# Patient Record
Sex: Male | Born: 1955 | Race: Black or African American | Hispanic: No | Marital: Single | Smoking: Former smoker
Health system: Southern US, Community
[De-identification: ages and names within clinical notes are randomized; demographics above are authoritative.]

## PROBLEM LIST (undated history)

## (undated) DIAGNOSIS — S82892A Other fracture of left lower leg, initial encounter for closed fracture: Secondary | ICD-10-CM

## (undated) DIAGNOSIS — F1410 Cocaine abuse, uncomplicated: Secondary | ICD-10-CM

## (undated) DIAGNOSIS — I10 Essential (primary) hypertension: Secondary | ICD-10-CM

## (undated) DIAGNOSIS — I2510 Atherosclerotic heart disease of native coronary artery without angina pectoris: Secondary | ICD-10-CM

## (undated) DIAGNOSIS — H5440 Blindness, one eye, unspecified eye: Secondary | ICD-10-CM

## (undated) DIAGNOSIS — I700 Atherosclerosis of aorta: Secondary | ICD-10-CM

## (undated) DIAGNOSIS — F17200 Nicotine dependence, unspecified, uncomplicated: Secondary | ICD-10-CM

## (undated) DIAGNOSIS — R0789 Other chest pain: Secondary | ICD-10-CM

## (undated) DIAGNOSIS — M1990 Unspecified osteoarthritis, unspecified site: Secondary | ICD-10-CM

## (undated) DIAGNOSIS — S8262XA Displaced fracture of lateral malleolus of left fibula, initial encounter for closed fracture: Secondary | ICD-10-CM

## (undated) DIAGNOSIS — I712 Thoracic aortic aneurysm, without rupture: Secondary | ICD-10-CM

## (undated) DIAGNOSIS — L910 Hypertrophic scar: Secondary | ICD-10-CM

## (undated) HISTORY — DX: Cocaine abuse, uncomplicated: F14.10

## (undated) HISTORY — DX: Essential (primary) hypertension: I10

## (undated) HISTORY — DX: Hypertrophic scar: L91.0

## (undated) HISTORY — DX: Atherosclerotic heart disease of native coronary artery without angina pectoris: I25.10

## (undated) HISTORY — DX: Displaced fracture of lateral malleolus of left fibula, initial encounter for closed fracture: S82.62XA

## (undated) HISTORY — DX: Thoracic aortic aneurysm, without rupture: I71.2

## (undated) HISTORY — DX: Atherosclerosis of aorta: I70.0

## (undated) HISTORY — DX: Nicotine dependence, unspecified, uncomplicated: F17.200

---
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
## 1998-02-07 ENCOUNTER — Emergency Department (HOSPITAL_COMMUNITY): Admission: EM | Admit: 1998-02-07 | Discharge: 1998-02-07 | Payer: Self-pay | Admitting: *Deleted

## 1998-06-14 ENCOUNTER — Emergency Department (HOSPITAL_COMMUNITY): Admission: EM | Admit: 1998-06-14 | Discharge: 1998-06-14 | Payer: Self-pay | Admitting: *Deleted

## 2000-07-08 ENCOUNTER — Encounter: Payer: Self-pay | Admitting: Emergency Medicine

## 2000-07-08 ENCOUNTER — Emergency Department (HOSPITAL_COMMUNITY): Admission: EM | Admit: 2000-07-08 | Discharge: 2000-07-08 | Payer: Self-pay | Admitting: Emergency Medicine

## 2000-07-10 ENCOUNTER — Observation Stay (HOSPITAL_COMMUNITY): Admission: RE | Admit: 2000-07-10 | Discharge: 2000-07-11 | Payer: Self-pay | Admitting: *Deleted

## 2001-06-05 ENCOUNTER — Emergency Department (HOSPITAL_COMMUNITY): Admission: EM | Admit: 2001-06-05 | Discharge: 2001-06-05 | Payer: Self-pay | Admitting: Emergency Medicine

## 2001-06-09 ENCOUNTER — Ambulatory Visit (HOSPITAL_COMMUNITY): Admission: RE | Admit: 2001-06-09 | Discharge: 2001-06-09 | Payer: Self-pay | Admitting: *Deleted

## 2001-06-12 ENCOUNTER — Ambulatory Visit (HOSPITAL_COMMUNITY): Admission: RE | Admit: 2001-06-12 | Discharge: 2001-06-12 | Payer: Self-pay | Admitting: Emergency Medicine

## 2001-11-24 ENCOUNTER — Emergency Department (HOSPITAL_COMMUNITY): Admission: EM | Admit: 2001-11-24 | Discharge: 2001-11-24 | Payer: Self-pay

## 2001-11-28 ENCOUNTER — Encounter: Payer: Self-pay | Admitting: Emergency Medicine

## 2001-11-28 ENCOUNTER — Emergency Department (HOSPITAL_COMMUNITY): Admission: EM | Admit: 2001-11-28 | Discharge: 2001-11-28 | Payer: Self-pay | Admitting: Emergency Medicine

## 2001-12-01 ENCOUNTER — Emergency Department (HOSPITAL_COMMUNITY): Admission: EM | Admit: 2001-12-01 | Discharge: 2001-12-01 | Payer: Self-pay | Admitting: Emergency Medicine

## 2002-01-13 ENCOUNTER — Encounter: Payer: Self-pay | Admitting: Internal Medicine

## 2002-01-13 ENCOUNTER — Encounter: Admission: RE | Admit: 2002-01-13 | Discharge: 2002-01-13 | Payer: Self-pay | Admitting: Internal Medicine

## 2003-02-02 ENCOUNTER — Emergency Department (HOSPITAL_COMMUNITY): Admission: EM | Admit: 2003-02-02 | Discharge: 2003-02-02 | Payer: Self-pay | Admitting: Emergency Medicine

## 2003-02-02 ENCOUNTER — Encounter: Payer: Self-pay | Admitting: Emergency Medicine

## 2003-04-30 ENCOUNTER — Emergency Department (HOSPITAL_COMMUNITY): Admission: EM | Admit: 2003-04-30 | Discharge: 2003-04-30 | Payer: Self-pay | Admitting: Emergency Medicine

## 2013-11-19 ENCOUNTER — Emergency Department (HOSPITAL_COMMUNITY)
Admission: EM | Admit: 2013-11-19 | Discharge: 2013-11-19 | Disposition: A | Discharge status: Home / Self Care | Payer: Medicaid Other | Attending: Emergency Medicine | Admitting: Emergency Medicine

## 2013-11-19 ENCOUNTER — Emergency Department (HOSPITAL_COMMUNITY): Payer: Medicaid Other

## 2013-11-19 ENCOUNTER — Encounter (HOSPITAL_COMMUNITY): Payer: Self-pay | Admitting: Emergency Medicine

## 2013-11-19 DIAGNOSIS — W010XXA Fall on same level from slipping, tripping and stumbling without subsequent striking against object, initial encounter: Secondary | ICD-10-CM | POA: Insufficient documentation

## 2013-11-19 DIAGNOSIS — S8263XA Displaced fracture of lateral malleolus of unspecified fibula, initial encounter for closed fracture: Secondary | ICD-10-CM | POA: Insufficient documentation

## 2013-11-19 DIAGNOSIS — F17200 Nicotine dependence, unspecified, uncomplicated: Secondary | ICD-10-CM | POA: Insufficient documentation

## 2013-11-19 DIAGNOSIS — Y939 Activity, unspecified: Secondary | ICD-10-CM | POA: Insufficient documentation

## 2013-11-19 DIAGNOSIS — S82899A Other fracture of unspecified lower leg, initial encounter for closed fracture: Secondary | ICD-10-CM

## 2013-11-19 DIAGNOSIS — Y9289 Other specified places as the place of occurrence of the external cause: Secondary | ICD-10-CM | POA: Insufficient documentation

## 2013-11-19 MED ORDER — OXYCODONE-ACETAMINOPHEN 5-325 MG PO TABS
2.0000 | ORAL_TABLET | Freq: Once | ORAL | Status: AC
  Administered 2013-11-19: 2 via ORAL
  Filled 2013-11-19: qty 2

## 2013-11-19 MED ORDER — OXYCODONE-ACETAMINOPHEN 5-325 MG PO TABS: 2.0000 | ORAL_TABLET | Freq: Four times a day (QID) | ORAL | Status: DC | PRN

## 2013-11-19 NOTE — ED Notes (Signed)
Ortho at bedside.

## 2013-11-19 NOTE — ED Notes (Signed)
Ortho paged for splint 

## 2013-11-19 NOTE — Discharge Instructions (Signed)
°  Ankle Fracture °A fracture is a break in the bone. A cast or splint is used to protect and keep your injured bone from moving.  °HOME CARE INSTRUCTIONS  °· Use your crutches as directed. °· To lessen the swelling, keep the injured leg elevated while sitting or lying down. °· Apply ice to the injury for 15-20 minutes, 03-04 times per day while awake for 2 days. Put the ice in a plastic bag and place a thin towel between the bag of ice and your cast. °· If you have a plaster or fiberglass cast: °· Do not try to scratch the skin under the cast using sharp or pointed objects. °· Check the skin around the cast every day. You may put lotion on any red or sore areas. °· Keep your cast dry and clean. °· If you have a plaster splint: °· Wear the splint as directed. °· You may loosen the elastic around the splint if your toes become numb, tingle, or turn cold or blue. °· Do not put pressure on any part of your cast or splint; it may break. Rest your cast only on a pillow the first 24 hours until it is fully hardened. °· Your cast or splint can be protected during bathing with a plastic bag. Do not lower the cast or splint into water. °· Take medications as directed by your caregiver. Only take over-the-counter or prescription medicines for pain, discomfort, or fever as directed by your caregiver. °· Do not drive a vehicle until your caregiver specifically tells you it is safe to do so. °· If your caregiver has given you a follow-up appointment, it is very important to keep that appointment. Not keeping the appointment could result in a chronic or permanent injury, pain, and disability. If there is any problem keeping the appointment, you must call back to this facility for assistance. °SEEK IMMEDIATE MEDICAL CARE IF:  °· Your cast gets damaged or breaks. °· You have continued severe pain or more swelling than you did before the cast was put on. °· Your skin or toenails below the injury turn blue or gray, or feel cold or  numb. °· There is a bad smell or new stains and/or purulent (pus like) drainage coming from under the cast. °If you do not have a window in your cast for observing the wound, a discharge or minor bleeding may show up as a stain on the outside of your cast. Report these findings to your caregiver. °MAKE SURE YOU:  °· Understand these instructions. °· Will watch your condition. °· Will get help right away if you are not doing well or get worse. °Document Released: 09/12/2000 Document Revised: 12/08/2011 Document Reviewed: 04/14/2013 °[employer]® Patient Information ©2014 [employer]. ° ° °

## 2013-11-19 NOTE — ED Notes (Addendum)
Pt. slipped on ice this evening and injured his left ankle with pain / swelling. No LOC .

## 2013-11-19 NOTE — Progress Notes (Signed)
Orthopedic Tech Progress Note Patient Details:  Lenord CarboSheldon L Coventry July 11, 1956 [id number]  Ortho Devices Type of Ortho Device: Ace wrap;Post (short leg) splint;Stirrup splint;Crutches Ortho Device/Splint Location: LLE Ortho Device/Splint Interventions: Ordered;Application   Jennye MoccasinHughes, Pavielle Biggar Craig 11/19/2013, 11:09 PM

## 2013-11-19 NOTE — ED Notes (Signed)
Pt returned from xray; ice and elevation provided.

## 2013-11-19 NOTE — ED Provider Notes (Signed)
CSN: [id number]     Arrival date & time 11/19/13  2005 History  This chart was scribed for non-physician practitioner, Fuller Canada, working with Hurman Horn, MD by Smiley Houseman, ED Scribe. This patient was seen in room TR11C/TR11C and the patient's care was started at 9:40 PM.  Chief Complaint  Patient presents with  . Ankle Pain   The history is provided by the patient. No language interpreter was used.   HPI Comments: Garrett Fleming is a 58 y.o. male who presents to the Emergency Department complaining of a left ankle injury that occurred this earlier this evening.  Pt states he slipped on ice and once he stood up he couldn't bear weight.  Pt states his pain is currently a 9 out of 10.  Pt noticed swelling and bruising around the medial aspect of his left ankle. Movement makes pain worse, rest makes better.   History reviewed. No pertinent past medical history. History reviewed. No pertinent past surgical history. No family history on file. History  Substance Use Topics  . Smoking status: Current Every Day Smoker  . Smokeless tobacco: Not on file  . Alcohol Use: Yes    Review of Systems  Constitutional: Negative for fever and chills.  Gastrointestinal: Negative for nausea, vomiting and abdominal pain.  Musculoskeletal: Positive for arthralgias (left ankle) and joint swelling (left ankle).  Psychiatric/Behavioral: Negative for behavioral problems and confusion.  All other systems reviewed and are negative.   Allergies  Review of patient's allergies indicates no known allergies.  Home Medications   Current Outpatient Rx  Name  Route  Sig  Dispense  Refill  . ibuprofen (ADVIL,MOTRIN) 600 MG tablet   Oral   Take 1,200 mg by mouth every 6 (six) hours as needed for mild pain.          Triage Vitals: BP 164/105  Pulse 103  Temp(Src) 97.8 F (36.6 C) (Oral)  Resp 16  Ht 6\' 4"  (1.93 m)  Wt 218 lb 1 oz (98.913 kg)  BMI 26.55 kg/m2  SpO2 96%  Physical Exam   Nursing note and vitals reviewed. Constitutional: He is oriented to person, place, and time. He appears well-developed and well-nourished. No distress.  HENT:  Head: Normocephalic and atraumatic.  Eyes: Conjunctivae and EOM are normal. Right eye exhibits no discharge. Left eye exhibits no discharge.  Neck: Neck supple. No tracheal deviation present.  Cardiovascular: Normal rate and intact distal pulses.   Brisk capillary refill.  Pulmonary/Chest: Effort normal. No respiratory distress.  Abdominal: Soft. He exhibits no distension.  Musculoskeletal: Normal range of motion.  Left ankle moderately tender to palpation of medial aspect with moderate swelling.  ROM and strength reduced secondary to pain.  No obvious bony abnormality.    Neurological: He is alert and oriented to person, place, and time.  Sensation intact.   Skin: Skin is warm and dry. No rash noted.  Psychiatric: He has a normal mood and affect. His behavior is normal.    ED Course  Procedures (including critical care time) DIAGNOSTIC STUDIES: Oxygen Saturation is 96% on RA, adequate by my interpretation.    COORDINATION OF CARE: 9:46 PM-Ordered an x-ray of left ankle.  Ordered percocet for pain. Patient informed of current plan of treatment and evaluation and agrees with plan.    10:40 PM-Will order ankle splint with heel stirrup.     Imaging Review Dg Ankle Complete Left  11/19/2013   CLINICAL DATA:  Larey Seat on ice and injured the  left ankle.  EXAM: LEFT ANKLE COMPLETE - 3+ VIEW  COMPARISON:  None.  FINDINGS: Comminuted oblique fracture involving the distal fibula. Avulsion fractures involving the tip of the medial malleolus. Lateral subluxation of the talus relative to the tibia. Diffuse soft tissue swelling. Joint effusion/hemarthrosis.  IMPRESSION: 1. Comminuted oblique fracture of the distal fibula. 2. Avulsion fractures involving the tip of the medial malleolus. 3. Lateral subluxation of the talus relative to the tibia.    Electronically Signed   By: Hulan Saashomas  Lawrence M.D.   On: 11/19/2013 21:56    MDM   Final diagnoses:  Ankle fracture   Patient with ankle fracture. I discussed patient with Dr. Magnus IvanBlackman, who will see the patient next week. Patient placed in splint and given crutches and pain medicine.  I personally performed the services described in this documentation, which was scribed in my presence. The recorded information has been reviewed and is accurate.     Roxy Horsemanobert Shikira Folino, PA-C 11/19/13 2358

## 2013-11-19 NOTE — ED Notes (Signed)
Patient transported to X-ray 

## 2013-11-20 NOTE — ED Provider Notes (Signed)
Medical screening examination/treatment/procedure(s) were performed by non-physician practitioner and as supervising physician I was immediately available for consultation/collaboration.  EKG Interpretation   None        Hurman HornJohn M Timmya Blazier, MD 11/20/13 [phone]

## 2013-11-23 ENCOUNTER — Encounter (HOSPITAL_COMMUNITY): Payer: Self-pay | Admitting: *Deleted

## 2013-11-23 ENCOUNTER — Other Ambulatory Visit (HOSPITAL_COMMUNITY): Payer: Self-pay | Admitting: Orthopaedic Surgery

## 2013-11-23 NOTE — Progress Notes (Signed)
Need orders in EPIC.  Surgery scheduled for 11/25/13.  Thank You.

## 2013-11-25 SURGERY — OPEN REDUCTION INTERNAL FIXATION (ORIF) ANKLE FRACTURE
Anesthesia: Choice | Site: Ankle | Laterality: Left

## 2013-11-29 ENCOUNTER — Encounter (HOSPITAL_COMMUNITY): Payer: Self-pay | Admitting: Pharmacy Technician

## 2013-12-01 ENCOUNTER — Ambulatory Visit (HOSPITAL_COMMUNITY): Admission: RE | Admit: 2013-12-01 | Payer: Medicaid Other | Source: Ambulatory Visit | Admitting: Orthopaedic Surgery

## 2013-12-01 ENCOUNTER — Encounter (HOSPITAL_COMMUNITY): Payer: Self-pay

## 2013-12-01 ENCOUNTER — Encounter (HOSPITAL_COMMUNITY)
Admission: RE | Admit: 2013-12-01 | Discharge: 2013-12-01 | Disposition: A | Discharge status: Home / Self Care | Payer: Medicaid Other | Source: Ambulatory Visit | Attending: Orthopaedic Surgery | Admitting: Orthopaedic Surgery

## 2013-12-01 HISTORY — DX: Blindness, one eye, unspecified eye: H54.40

## 2013-12-01 HISTORY — DX: Other fracture of left lower leg, initial encounter for closed fracture: S82.892A

## 2013-12-01 HISTORY — DX: Other chest pain: R07.89

## 2013-12-01 HISTORY — DX: Unspecified osteoarthritis, unspecified site: M19.90

## 2013-12-01 LAB — CBC
HEMATOCRIT: 37.4 % — AB (ref 39.0–52.0)
HEMOGLOBIN: 12.5 g/dL — AB (ref 13.0–17.0)
MCH: 31.6 pg (ref 26.0–34.0)
MCHC: 33.4 g/dL (ref 30.0–36.0)
MCV: 94.4 fL (ref 78.0–100.0)
Platelets: 329 10*3/uL (ref 150–400)
RBC: 3.96 MIL/uL — ABNORMAL LOW (ref 4.22–5.81)
RDW: 12.2 % (ref 11.5–15.5)
WBC: 5.9 10*3/uL (ref 4.0–10.5)

## 2013-12-01 NOTE — Patient Instructions (Signed)
   [facility]  ON:  Friday  3/6  REPORT TO  SHORT STAY CENTER AT:  12:00 PM      PHONE # FOR SHORT STAY IS [phone]  DO NOT EAT  ANYTHING AFTER MIDNIGHT THE NIGHT BEFORE YOUR SURGERY.  YOU MAY BRUSH YOUR TEETH, NO FOOD, NO CHEWING GUM, NO MINTS, NO CANDIES, NO CHEWING TOBACCO. YOU MAY HAVE CLEAR LIQUIDS TO DRINK FROM MIDNIGHT UNTIL 8:15 AM DAY OF SURGERY - LIKE WATER,  SODA.   NOTHING TO DRINK AFTER 8:15 AM DAY OF SURGERY.  PLEASE TAKE THE FOLLOWING MEDICATIONS THE AM OF YOUR SURGERY WITH A FEW SIPS OF WATER:  PAIN PILL IF NEEDED.   DO NOT BRING VALUABLES, MONEY, CREDIT CARDS.  DO NOT WEAR JEWELRY, MAKE-UP, NAIL POLISH AND NO METAL PINS OR CLIPS IN YOUR HAIR. CONTACT LENS, DENTURES / PARTIALS, GLASSES SHOULD NOT BE WORN TO SURGERY AND IN MOST CASES-HEARING AIDS WILL NEED TO BE REMOVED.  BRING YOUR GLASSES CASE, ANY EQUIPMENT NEEDED FOR YOUR CONTACT LENS. FOR PATIENTS ADMITTED TO THE HOSPITAL--CHECK OUT TIME THE DAY OF DISCHARGE IS 11:00 AM.  ALL INPATIENT ROOMS ARE PRIVATE - WITH BATHROOM, TELEPHONE, TELEVISION AND WIFI INTERNET.    FAILURE TO FOLLOW THESE INSTRUCTIONS MAY RESULT IN THE CANCELLATION OF YOUR SURGERY. PLEASE BE AWARE THAT YOU MAY NEED ADDITIONAL BLOOD DRAWN DAY OF YOUR SURGERY  PATIENT SIGNATURE_________________________________
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
## 2013-12-02 ENCOUNTER — Ambulatory Visit (HOSPITAL_COMMUNITY): Payer: Medicaid Other

## 2013-12-02 ENCOUNTER — Encounter (HOSPITAL_COMMUNITY): Payer: Medicaid Other | Admitting: Anesthesiology

## 2013-12-02 ENCOUNTER — Ambulatory Visit (HOSPITAL_COMMUNITY): Payer: Medicaid Other | Admitting: Anesthesiology

## 2013-12-02 ENCOUNTER — Observation Stay (HOSPITAL_COMMUNITY)
Admission: RE | Admit: 2013-12-02 | Discharge: 2013-12-03 | Disposition: A | Discharge status: Home / Self Care | Payer: Medicaid Other | Source: Ambulatory Visit | Attending: Orthopaedic Surgery | Admitting: Orthopaedic Surgery

## 2013-12-02 ENCOUNTER — Encounter (HOSPITAL_COMMUNITY)
Admission: RE | Disposition: A | Discharge status: Home / Self Care | Payer: Self-pay | Source: Ambulatory Visit | Attending: Orthopaedic Surgery

## 2013-12-02 ENCOUNTER — Encounter (HOSPITAL_COMMUNITY): Payer: Self-pay | Admitting: *Deleted

## 2013-12-02 DIAGNOSIS — Y9329 Activity, other involving ice and snow: Secondary | ICD-10-CM | POA: Insufficient documentation

## 2013-12-02 DIAGNOSIS — S8263XA Displaced fracture of lateral malleolus of unspecified fibula, initial encounter for closed fracture: Principal | ICD-10-CM | POA: Insufficient documentation

## 2013-12-02 DIAGNOSIS — S8262XA Displaced fracture of lateral malleolus of left fibula, initial encounter for closed fracture: Secondary | ICD-10-CM

## 2013-12-02 DIAGNOSIS — F17200 Nicotine dependence, unspecified, uncomplicated: Secondary | ICD-10-CM | POA: Insufficient documentation

## 2013-12-02 DIAGNOSIS — H5440 Blindness, one eye, unspecified eye: Secondary | ICD-10-CM | POA: Insufficient documentation

## 2013-12-02 DIAGNOSIS — W010XXA Fall on same level from slipping, tripping and stumbling without subsequent striking against object, initial encounter: Secondary | ICD-10-CM | POA: Insufficient documentation

## 2013-12-02 HISTORY — PX: ORIF ANKLE FRACTURE: SHX5408

## 2013-12-02 HISTORY — DX: Displaced fracture of lateral malleolus of left fibula, initial encounter for closed fracture: S82.62XA

## 2013-12-02 SURGERY — OPEN REDUCTION INTERNAL FIXATION (ORIF) ANKLE FRACTURE
Anesthesia: General | Site: Ankle | Laterality: Left

## 2013-12-02 MED ORDER — LACTATED RINGERS IV SOLN: INTRAVENOUS | Status: DC

## 2013-12-02 MED ORDER — HYDROMORPHONE HCL PF 1 MG/ML IJ SOLN
0.2500 mg | INTRAMUSCULAR | Status: DC | PRN
  Administered 2013-12-02 (×5): 0.5 mg via INTRAVENOUS

## 2013-12-02 MED ORDER — FENTANYL CITRATE 0.05 MG/ML IJ SOLN
INTRAMUSCULAR | Status: AC
  Filled 2013-12-02: qty 2

## 2013-12-02 MED ORDER — PROMETHAZINE HCL 25 MG/ML IJ SOLN: 6.2500 mg | INTRAMUSCULAR | Status: DC | PRN

## 2013-12-02 MED ORDER — OXYCODONE HCL 5 MG PO TABS
5.0000 mg | ORAL_TABLET | ORAL | Status: DC | PRN
  Administered 2013-12-02 (×2): 5 mg via ORAL
  Administered 2013-12-03 (×6): 10 mg via ORAL
  Filled 2013-12-02 (×4): qty 2
  Filled 2013-12-02 (×2): qty 1
  Filled 2013-12-02 (×2): qty 2

## 2013-12-02 MED ORDER — MIDAZOLAM HCL 2 MG/2ML IJ SOLN
INTRAMUSCULAR | Status: AC
  Filled 2013-12-02: qty 2

## 2013-12-02 MED ORDER — LIDOCAINE HCL (CARDIAC) 20 MG/ML IV SOLN
INTRAVENOUS | Status: DC | PRN
  Administered 2013-12-02: 75 mg via INTRAVENOUS

## 2013-12-02 MED ORDER — POLYETHYLENE GLYCOL 3350 17 G PO PACK: 17.0000 g | PACK | Freq: Every day | ORAL | Status: DC | PRN

## 2013-12-02 MED ORDER — CEFAZOLIN SODIUM 1-5 GM-% IV SOLN
1.0000 g | Freq: Four times a day (QID) | INTRAVENOUS | Status: AC
  Administered 2013-12-02 – 2013-12-03 (×3): 1 g via INTRAVENOUS
  Filled 2013-12-02 (×3): qty 50

## 2013-12-02 MED ORDER — MIDAZOLAM HCL 5 MG/5ML IJ SOLN
INTRAMUSCULAR | Status: DC | PRN
  Administered 2013-12-02 (×2): 1 mg via INTRAVENOUS

## 2013-12-02 MED ORDER — ACETAMINOPHEN 10 MG/ML IV SOLN
1000.0000 mg | Freq: Once | INTRAVENOUS | Status: AC
  Administered 2013-12-02: 1000 mg via INTRAVENOUS
  Filled 2013-12-02: qty 100

## 2013-12-02 MED ORDER — ESMOLOL HCL 10 MG/ML IV SOLN
INTRAVENOUS | Status: DC | PRN
  Administered 2013-12-02: 10 mg via INTRAVENOUS

## 2013-12-02 MED ORDER — CEFAZOLIN SODIUM-DEXTROSE 2-3 GM-% IV SOLR
INTRAVENOUS | Status: AC
  Filled 2013-12-02: qty 50

## 2013-12-02 MED ORDER — METOCLOPRAMIDE HCL 5 MG/ML IJ SOLN: 5.0000 mg | Freq: Three times a day (TID) | INTRAMUSCULAR | Status: DC | PRN

## 2013-12-02 MED ORDER — PROPOFOL 10 MG/ML IV BOLUS
INTRAVENOUS | Status: AC
  Filled 2013-12-02: qty 20

## 2013-12-02 MED ORDER — CEFAZOLIN SODIUM-DEXTROSE 2-3 GM-% IV SOLR
2.0000 g | INTRAVENOUS | Status: AC
  Administered 2013-12-02: 2 g via INTRAVENOUS

## 2013-12-02 MED ORDER — MORPHINE SULFATE 2 MG/ML IJ SOLN: 2.0000 mg | INTRAMUSCULAR | Status: DC | PRN

## 2013-12-02 MED ORDER — SODIUM CHLORIDE 0.9 % IJ SOLN
INTRAMUSCULAR | Status: AC
  Filled 2013-12-02: qty 10

## 2013-12-02 MED ORDER — EPHEDRINE SULFATE 50 MG/ML IJ SOLN
INTRAMUSCULAR | Status: AC
  Filled 2013-12-02: qty 1

## 2013-12-02 MED ORDER — HYDROMORPHONE HCL PF 1 MG/ML IJ SOLN
INTRAMUSCULAR | Status: AC
  Filled 2013-12-02: qty 1

## 2013-12-02 MED ORDER — HYDROMORPHONE HCL PF 1 MG/ML IJ SOLN
0.2500 mg | INTRAMUSCULAR | Status: DC | PRN
  Administered 2013-12-02 (×2): 0.5 mg via INTRAVENOUS

## 2013-12-02 MED ORDER — LIDOCAINE HCL (CARDIAC) 20 MG/ML IV SOLN
INTRAVENOUS | Status: AC
  Filled 2013-12-02: qty 5

## 2013-12-02 MED ORDER — ASPIRIN EC 325 MG PO TBEC: 325.0000 mg | DELAYED_RELEASE_TABLET | Freq: Two times a day (BID) | ORAL | Status: DC

## 2013-12-02 MED ORDER — ONDANSETRON HCL 4 MG/2ML IJ SOLN
INTRAMUSCULAR | Status: DC | PRN
  Administered 2013-12-02: 4 mg via INTRAVENOUS

## 2013-12-02 MED ORDER — ONDANSETRON HCL 4 MG PO TABS: 4.0000 mg | ORAL_TABLET | Freq: Four times a day (QID) | ORAL | Status: DC | PRN

## 2013-12-02 MED ORDER — BUPIVACAINE HCL (PF) 0.5 % IJ SOLN
INTRAMUSCULAR | Status: DC | PRN
  Administered 2013-12-02: 10 mL

## 2013-12-02 MED ORDER — BUPIVACAINE HCL (PF) 0.5 % IJ SOLN
INTRAMUSCULAR | Status: AC
  Filled 2013-12-02: qty 30

## 2013-12-02 MED ORDER — DIPHENHYDRAMINE HCL 12.5 MG/5ML PO ELIX: 12.5000 mg | ORAL_SOLUTION | ORAL | Status: DC | PRN

## 2013-12-02 MED ORDER — DOCUSATE SODIUM 100 MG PO CAPS
100.0000 mg | ORAL_CAPSULE | Freq: Two times a day (BID) | ORAL | Status: DC
  Administered 2013-12-02 – 2013-12-03 (×2): 100 mg via ORAL

## 2013-12-02 MED ORDER — FENTANYL CITRATE 0.05 MG/ML IJ SOLN
INTRAMUSCULAR | Status: AC
  Filled 2013-12-02: qty 5

## 2013-12-02 MED ORDER — ONDANSETRON HCL 4 MG/2ML IJ SOLN: 4.0000 mg | Freq: Four times a day (QID) | INTRAMUSCULAR | Status: DC | PRN

## 2013-12-02 MED ORDER — ONDANSETRON HCL 4 MG/2ML IJ SOLN
INTRAMUSCULAR | Status: AC
  Filled 2013-12-02: qty 2

## 2013-12-02 MED ORDER — MEPERIDINE HCL 50 MG/ML IJ SOLN
6.2500 mg | INTRAMUSCULAR | Status: DC | PRN
  Administered 2013-12-02: 12.5 mg via INTRAVENOUS

## 2013-12-02 MED ORDER — METHOCARBAMOL 100 MG/ML IJ SOLN
500.0000 mg | Freq: Four times a day (QID) | INTRAMUSCULAR | Status: DC | PRN
  Administered 2013-12-02: 500 mg via INTRAVENOUS
  Filled 2013-12-02 (×2): qty 5

## 2013-12-02 MED ORDER — OXYCODONE-ACETAMINOPHEN 5-325 MG PO TABS: 1.0000 | ORAL_TABLET | ORAL | Status: DC | PRN

## 2013-12-02 MED ORDER — SODIUM CHLORIDE 0.9 % IV SOLN
INTRAVENOUS | Status: DC
  Administered 2013-12-02: 1000 mL via INTRAVENOUS
  Administered 2013-12-03: 11:00:00 via INTRAVENOUS

## 2013-12-02 MED ORDER — PROPOFOL 10 MG/ML IV BOLUS
INTRAVENOUS | Status: DC | PRN
  Administered 2013-12-02: 25 mg via INTRAVENOUS
  Administered 2013-12-02: 175 mg via INTRAVENOUS

## 2013-12-02 MED ORDER — FENTANYL CITRATE 0.05 MG/ML IJ SOLN
INTRAMUSCULAR | Status: DC | PRN
  Administered 2013-12-02: 50 ug via INTRAVENOUS
  Administered 2013-12-02: 25 ug via INTRAVENOUS
  Administered 2013-12-02: 50 ug via INTRAVENOUS
  Administered 2013-12-02: 25 ug via INTRAVENOUS
  Administered 2013-12-02 (×2): 50 ug via INTRAVENOUS

## 2013-12-02 MED ORDER — ESMOLOL HCL 10 MG/ML IV SOLN
INTRAVENOUS | Status: AC
  Filled 2013-12-02: qty 10

## 2013-12-02 MED ORDER — METHOCARBAMOL 500 MG PO TABS
500.0000 mg | ORAL_TABLET | Freq: Four times a day (QID) | ORAL | Status: DC | PRN
  Administered 2013-12-03: 500 mg via ORAL
  Filled 2013-12-02: qty 1

## 2013-12-02 MED ORDER — ATROPINE SULFATE 0.4 MG/ML IJ SOLN
INTRAMUSCULAR | Status: AC
  Filled 2013-12-02: qty 1

## 2013-12-02 MED ORDER — METOCLOPRAMIDE HCL 10 MG PO TABS: 5.0000 mg | ORAL_TABLET | Freq: Three times a day (TID) | ORAL | Status: DC | PRN

## 2013-12-02 MED ORDER — HYDROCODONE-ACETAMINOPHEN 5-325 MG PO TABS: 1.0000 | ORAL_TABLET | ORAL | Status: DC | PRN

## 2013-12-02 MED ORDER — LACTATED RINGERS IV SOLN
INTRAVENOUS | Status: DC
  Administered 2013-12-02 (×2): via INTRAVENOUS
  Administered 2013-12-02: 1000 mL via INTRAVENOUS

## 2013-12-02 MED ORDER — MEPERIDINE HCL 50 MG/ML IJ SOLN
INTRAMUSCULAR | Status: AC
  Filled 2013-12-02: qty 1

## 2013-12-02 SURGICAL SUPPLY — 45 items
BAG SPEC THK2 15X12 ZIP CLS (MISCELLANEOUS) ×1
BAG ZIPLOCK 12X15 (MISCELLANEOUS) ×3 IMPLANT
BANDAGE ELASTIC 3 VELCRO ST LF (GAUZE/BANDAGES/DRESSINGS) ×2 IMPLANT
BANDAGE ELASTIC 6 VELCRO ST LF (GAUZE/BANDAGES/DRESSINGS) ×2 IMPLANT
BIT DRILL 3.5 QC 155 (BIT) ×1 IMPLANT
BIT DRILL 3.5 QC 155MM (BIT) ×1
BIT DRILL QC 2.7 6.3IN  SHORT (BIT) ×2
BIT DRILL QC 2.7 6.3IN SHORT (BIT) IMPLANT
COVER SURGICAL LIGHT HANDLE (MISCELLANEOUS) ×3 IMPLANT
CUFF TOURN SGL QUICK 34 (TOURNIQUET CUFF) ×3
CUFF TRNQT CYL 34X4X40X1 (TOURNIQUET CUFF) ×1 IMPLANT
DECANTER SPIKE VIAL GLASS SM (MISCELLANEOUS) ×3 IMPLANT
DRAPE C-ARM 42X120 X-RAY (DRAPES) ×3 IMPLANT
DRAPE U-SHAPE 47X51 STRL (DRAPES) ×3 IMPLANT
DRSG ADAPTIC 3X8 NADH LF (GAUZE/BANDAGES/DRESSINGS) ×3 IMPLANT
ELECT REM PT RETURN 9FT ADLT (ELECTROSURGICAL) ×3
ELECTRODE REM PT RTRN 9FT ADLT (ELECTROSURGICAL) ×1 IMPLANT
GAUZE XEROFORM 5X9 LF (GAUZE/BANDAGES/DRESSINGS) ×2 IMPLANT
GLOVE BIO SURGEON STRL SZ7.5 (GLOVE) ×3 IMPLANT
GLOVE BIOGEL PI IND STRL 8 (GLOVE) ×1 IMPLANT
GLOVE BIOGEL PI INDICATOR 8 (GLOVE) ×2
GLOVE ECLIPSE 8.0 STRL XLNG CF (GLOVE) ×3 IMPLANT
GOWN STRL REUS W/TWL XL LVL3 (GOWN DISPOSABLE) ×3 IMPLANT
NEEDLE HYPO 22GX1.5 SAFETY (NEEDLE) ×3 IMPLANT
PACK LOWER EXTREMITY WL (CUSTOM PROCEDURE TRAY) ×3 IMPLANT
PAD ABD 8X10 STRL (GAUZE/BANDAGES/DRESSINGS) ×3 IMPLANT
PAD CAST 4YDX4 CTTN HI CHSV (CAST SUPPLIES) ×2 IMPLANT
PADDING CAST COTTON 4X4 STRL (CAST SUPPLIES) ×6
PLATE FIBULA DISTAL 5 HOLE (Plate) ×2 IMPLANT
POSITIONER SURGICAL ARM (MISCELLANEOUS) ×3 IMPLANT
SCREW CANCELLOUS 5.0X16MM (Screw) ×2 IMPLANT
SCREW LOCK 3.5X14 (Screw) ×2 IMPLANT
SCREW LOCK 3.5X16 (Screw) ×2 IMPLANT
SCREW NL 3.5X14 (Screw) ×2 IMPLANT
SCREW NLCK 16X3.5XST CORT PRLC (Screw) IMPLANT
SCREW NON LOCK 3.5X12 (Screw) ×2 IMPLANT
SCREW NONLOCK 3.5X16 (Screw) ×3 IMPLANT
SPONGE GAUZE 4X4 12PLY (GAUZE/BANDAGES/DRESSINGS) ×3 IMPLANT
SUT ETHILON 2 0 PS N (SUTURE) ×2 IMPLANT
SUT ETHILON 4 0 PS 2 18 (SUTURE) ×6 IMPLANT
SUT VIC AB 0 CT1 36 (SUTURE) ×4 IMPLANT
SUT VIC AB 2-0 CT1 27 (SUTURE) ×3
SUT VIC AB 2-0 CT1 TAPERPNT 27 (SUTURE) ×1 IMPLANT
SYR CONTROL 10ML LL (SYRINGE) ×3 IMPLANT
TOWEL OR 17X26 10 PK STRL BLUE (TOWEL DISPOSABLE) ×6 IMPLANT

## 2013-12-02 NOTE — Preoperative (Addendum)
Beta Blockers   Reason not to administer Beta Blockers:Not Applicable 

## 2013-12-02 NOTE — Transfer of Care (Signed)
Immediate Anesthesia Transfer of Care Note  Patient: Garrett CarboSheldon L Fleming  Procedure(s) Performed: Procedure(s): OPEN REDUCTION INTERNAL FIXATION (ORIF) LEFT ANKLE FRACTURE (Left)  Patient Location: PACU  Anesthesia Type:General  Level of Consciousness: awake, pateint uncooperative, confused, lethargic and responds to stimulation  Airway & Oxygen Therapy: Patient Spontanous Breathing and Patient connected to face mask oxygen  Post-op Assessment: Report given to PACU RN, Post -op Vital signs reviewed and stable and Patient moving all extremities  Post vital signs: Reviewed and stable  Complications: No apparent anesthesia complications

## 2013-12-02 NOTE — Anesthesia Postprocedure Evaluation (Signed)
Anesthesia Post Note  Patient: Garrett Fleming  Procedure(s) Performed: Procedure(s) (LRB): OPEN REDUCTION INTERNAL FIXATION (ORIF) LEFT ANKLE FRACTURE (Left)  Anesthesia type: General  Patient location: PACU  Post pain: Pain level controlled  Post assessment: Post-op Vital signs reviewed  Last Vitals:  Filed Vitals:   12/02/13 1149  BP: 144/102  Pulse: 60  Temp: 37.1 C  Resp: 18    Post vital signs: Reviewed  Level of consciousness: sedated  Complications: No apparent anesthesia complications
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
## 2013-12-02 NOTE — Anesthesia Preprocedure Evaluation (Addendum)
Anesthesia Evaluation  Patient identified by MRN, date of birth, ID band Patient awake    Reviewed: Allergy & Precautions, H&P , NPO status , Patient's Chart, lab work & pertinent test results  Airway Mallampati: II TM Distance: >3 FB Neck ROM: Full    Dental  (+) Poor Dentition, Chipped, Missing, Dental Advisory Given   Pulmonary neg pulmonary ROS, Current Smoker,  breath sounds clear to auscultation  Pulmonary exam normal       Cardiovascular negative cardio ROS  Rhythm:Regular Rate:Normal     Neuro/Psych negative neurological ROS  negative psych ROS   GI/Hepatic negative GI ROS, Neg liver ROS,   Endo/Other  negative endocrine ROS  Renal/GU negative Renal ROS  negative genitourinary   Musculoskeletal negative musculoskeletal ROS (+)   Abdominal   Peds  Hematology negative hematology ROS (+)   Anesthesia Other Findings   Reproductive/Obstetrics                          Anesthesia Physical Anesthesia Plan  ASA: II  Anesthesia Plan: General   Post-op Pain Management:    Induction: Intravenous  Airway Management Planned: LMA  Additional Equipment:   Intra-op Plan:   Post-operative Plan: Extubation in OR  Informed Consent: I have reviewed the patients History and Physical, chart, labs and discussed the procedure including the risks, benefits and alternatives for the proposed anesthesia with the patient or authorized representative who has indicated his/her understanding and acceptance.   Dental advisory given  Plan Discussed with: CRNA  Anesthesia Plan Comments:         Anesthesia Quick Evaluation

## 2013-12-02 NOTE — Anesthesia Procedure Notes (Signed)
Procedure Name: LMA Insertion Date/Time: 12/02/2013 3:04 PM Performed by: Edison PaceGRAY, Lindell Renfrew E Pre-anesthesia Checklist: Patient identified, Patient being monitored, Timeout performed, Emergency Drugs available and Suction available Patient Re-evaluated:Patient Re-evaluated prior to inductionOxygen Delivery Method: Circle system utilized Preoxygenation: Pre-oxygenation with 100% oxygen Intubation Type: IV induction LMA: LMA inserted LMA Size: 4.0 Number of attempts: 1 Placement Confirmation: positive ETCO2 Tube secured with: Tape Dental Injury: Teeth and Oropharynx as per pre-operative assessment

## 2013-12-02 NOTE — Brief Op Note (Signed)
12/02/2013  4:04 PM  PATIENT:  Garrett Fleming  58 y.o. male  PRE-OPERATIVE DIAGNOSIS:  Left ankle lateral malleolus fracture  POST-OPERATIVE DIAGNOSIS:  Left ankle lateral malleolus fracture  PROCEDURE:  Procedure(s): OPEN REDUCTION INTERNAL FIXATION (ORIF) LEFT ANKLE FRACTURE (Left)  SURGEON:  Surgeon(s) and Role:    * Kathryne Hitchhristopher Y Sahra Converse, MD - Primary  PHYSICIAN ASSISTANT: Rexene EdisonGil Clark, PA-C  ANESTHESIA:   local and general  EBL:  Total I/O In: 1000 [I.V.:1000] Out: -   BLOOD ADMINISTERED:none  DRAINS: none   LOCAL MEDICATIONS USED:  MARCAINE     SPECIMEN:  No Specimen  DISPOSITION OF SPECIMEN:  N/A  COUNTS:  YES  TOURNIQUET:  * Missing tourniquet times found for documented tourniquets in log:  [id number] *  DICTATION: .Other Dictation: Dictation Number [id number]  PLAN OF CARE: Admit for overnight observation  PATIENT DISPOSITION:  PACU - hemodynamically stable.   Delay start of Pharmacological VTE agent (>24hrs) due to surgical blood loss or risk of bleeding: no

## 2013-12-03 NOTE — Op Note (Signed)
Garrett Fleming, Garrett Fleming              ACCOUNT NO.:  [id number]  MEDICAL RECORD NO.:  [id number]  LOCATION:  1609                         [facility]  PHYSICIAN:  Vanita Pandahristopher Y. Magnus IvanBlackman, M.D.DATE OF BIRTH:  04-05-1956  DATE OF PROCEDURE:  12/02/2013 DATE OF DISCHARGE:                              OPERATIVE REPORT   POSTOPERATIVE DIAGNOSIS:  Left unstable ankle lateral malleolus fracture with medial joint space widening.  POSTOPERATIVE DIAGNOSIS:  Left unstable ankle lateral malleolus fracture with medial joint space widening.  PROCEDURE:  Open reduction and internal fixation of left ankle lateral malleolus fracture.  SURGEON:  Doneen Poissonhristopher Patryk Conant MD.  ASSISTING:  Richardean CanalGilbert Clark, PA-C.  ANESTHESIA: 1. General. 2. Local with 0.25% plain Sensorcaine.  TOURNIQUET TIME:  Less than 1 hour.  COMPLICATIONS:  None.  INDICATIONS:  Mr. Garrett Fleming is a 58 year old gentleman, who almost 2 weeks ago slipped and fell on the ice sustaining a unstable left ankle fracture.  He had fracture of the lateral malleolus and significant widening of the medial joint space.  He was placed appropriately in a splint and given followup in our office.  We had him set up for surgery week ago, but then increment whether came and he had a funeral and got stuck.  He follows and was able to get back to town this week and now presents for definitive fixation of the ankle fracture.  He understands the reason behind proceeding with surgery given the unstable nature of this injury.  He does wish to proceed.  PROCEDURE DESCRIPTION:  After informed consent was obtained. Appropriate left ankle was marked.  He was brought to the operating room, placed supine on the operating table.  General anesthesia was obtained.  A nonsterile tourniquet was placed on his upper left leg and his left leg was prepped and draped from the knee down the toes with DuraPrep and sterile drapes.  Time-out was called to identify  correct patient, correct left ankle.  We then used an Esmarch wrap of the ankle and tourniquet was inflated to 300 mmHg.  We then made incision directly midline over the fibula and carried this proximally and distally.  We were able to dissect down the fibula itself and found a comminuted fibular fracture.  It took quite some effort to get it reduced.  We cannot place the lag screw of anterior to posterior due to the comminution.  We were able to fashion a periarticular fibular plate from [employer] around the lateral cortex of the fibula and secured this with bicortical screws proximally and 5-0 cancellous screw followed by 2 locking screws distally to hold the fracture reduced.  Once this was done under direct fluoroscopic guidance, we stressed the ankle joint in the mortise of the ankle joint stay congruent.  We then irrigated the soft tissue with normal saline solution and closed the deep tissue over the plate with 0 Vicryl followed by 2-0 Vicryl in subcutaneous tissue, and interrupted 2-0 nylon on the skin.  We infiltrated the incision with 0.25% plain Sensorcaine, and I placed Xeroform and a well-padded dressing.  The tourniquet was let down.  The toes pinked nicely.  He was awakened, extubated, and taken to recovery  room in stable condition.  All final counts were correct.  There were no complications noted.     Vanita Panda. Magnus Ivan, M.D.     CYB/MEDQ  D:  12/02/2013  T:  12/03/2013  Job:  [id number]

## 2013-12-03 NOTE — Evaluation (Signed)
Physical Therapy Evaluation Patient Details Name: Garrett Fleming MRN: [id number] DOB: 1956/04/06 Today's Date: 12/03/2013 Time: 4098-1191 PT Time Calculation (min): 34 min  PT Assessment / Plan / Recommendation History of Present Illness  Ankle fx s/p ~ 2 weeks; Final fixation 12/02/13  Clinical Impression  Pt s/p L ankle fx presents with functional mobility limitations 2* NWB status and post op pain.  Pt mobilizing with Sup level assist on RW and plans d/c home with assist of "lady friend".    PT Assessment  Patent does not need any further PT services    Follow Up Recommendations  No PT follow up    Does the patient have the potential to tolerate intense rehabilitation      Barriers to Discharge        Equipment Recommendations  Rolling walker with 5" wheels;3in1 (PT)    Recommendations for Other Services     Frequency      Precautions / Restrictions Precautions Precautions: Fall Required Braces or Orthoses: Other Brace/Splint Other Brace/Splint: Cam Walker on L  Restrictions Weight Bearing Restrictions: Yes LLE Weight Bearing: Non weight bearing   Pertinent Vitals/Pain 7/10 with L LE dependent; Pain meds received during session      Mobility  Bed Mobility Overal bed mobility: Modified Independent Transfers Overall transfer level: Needs assistance Equipment used: Rolling walker (2 wheeled) Transfers: Sit to/from Stand Sit to Stand: Min guard;Supervision General transfer comment: cues for saftey, transition position, LE management and use of UEs to self assist Ambulation/Gait Ambulation/Gait assistance: Min guard;Supervision Ambulation Distance (Feet): 50 Feet (and 30) Assistive device: Rolling walker (2 wheeled) Gait Pattern/deviations: Step-to pattern;Trunk flexed Gait velocity: decr General Gait Details: Cues for posture, sequence, LE management and position from RW Stairs: Yes Stairs assistance: Min assist Stair Management: One rail Right;Step to  pattern;Forwards;With crutches Number of Stairs: 4 General stair comments: cues for sequence, foot/crutch placement and L LE management    Exercises     PT Diagnosis:    PT Problem List:   PT Treatment Interventions:       PT Goals(Current goals can be found in the care plan section) Acute Rehab PT Goals Patient Stated Goal: HOME PT Goal Formulation: No goals set, d/c therapy  Visit Information  Last PT Received On: 12/03/13 Assistance Needed: +1 History of Present Illness: Ankle fx s/p ~ 2 weeks; Final fixation 12/02/13       Prior Functioning  Home Living Family/patient expects to be discharged to:: Private residence Living Arrangements: Spouse/significant other Available Help at Discharge: Family Type of Home: Apartment Home Access: Stairs to enter Secretary/administrator of Steps: 3 Entrance Stairs-Rails: Right;Left Home Layout: One level Home Equipment: Crutches Additional Comments: Pt states he has been struggling maintaining NWB on crutches at home.  Spouse is present and states pt is not stable withi crutches Prior Function Level of Independence: Needs assistance Comments: Pt on crutches and NWB x 2 weeks - IND prior to this Communication Communication: No difficulties Dominant Hand: Right    Cognition  Cognition Arousal/Alertness: Awake/alert Behavior During Therapy: WFL for tasks assessed/performed Overall Cognitive Status: Within Functional Limits for tasks assessed    Extremity/Trunk Assessment Upper Extremity Assessment Upper Extremity Assessment: Overall WFL for tasks assessed Lower Extremity Assessment Lower Extremity Assessment: LLE deficits/detail LLE Deficits / Details: Ankle immobilized Cervical / Trunk Assessment Cervical / Trunk Assessment: Normal   Balance    End of Session PT - End of Session Equipment Utilized During Treatment: Gait belt Activity Tolerance: Patient  tolerated treatment well;Patient limited by pain Patient left: in  bed;with call bell/phone within reach;with family/visitor present Nurse Communication: Mobility status  GP     Garrett Fleming 12/03/2013, 4:12 PM

## 2013-12-03 NOTE — Discharge Summary (Signed)
  Discharge to home in stable condition status post open reduction internal fixation left ankle lateral malleolus. Prescription for Percocet for pain aspirin for DVT prophylaxis. Fracture boot protected weightbearing on the left continue elevation.

## 2013-12-03 NOTE — Progress Notes (Signed)
   CARE MANAGEMENT NOTE 12/03/2013  Patient:  Lenord CarboLSTON,Jacen L   Account Number:  [id number]  Date Initiated:  12/03/2013  Documentation initiated by:  [facility] Center[facility]AV[facility],Shabnam Ladd  Subjective/Objective Assessment:   Open reduction and internal fixation of left ankle lateral  malleolus fracture.     Action/Plan:   lives at home with wife   Anticipated DC Date:  12/03/2013   Anticipated DC Plan:  HOME/SELF CARE      DC Planning Services  CM consult      Choice offered to / List presented to:     DME arranged  [facility]  3-N-1      DME agency  [facility]        Status of service:  Completed, signed off Medicare Important Message given?   (If response is "NO", the following Medicare IM given date fields will be blank) Date Medicare IM given:   Date Additional Medicare IM given:    Discharge Disposition:  HOME/SELF CARE  Per UR Regulation:    If discussed at Long Length of Stay Meetings, dates discussed:    Comments:  12/03/2013 1600 NCM spoke to pt and no HH PT recommended. Pt requesting RW and 3n1 for home. Contacted [facility] for DME for home. Isidoro DonningAlesia Lylith Bebeau RN CCM Case Mgmt phone [phone]

## 2013-12-05 ENCOUNTER — Encounter (HOSPITAL_COMMUNITY): Payer: Self-pay | Admitting: Orthopaedic Surgery

## 2016-01-14 ENCOUNTER — Encounter: Payer: Self-pay | Admitting: Specialist

## 2016-06-17 ENCOUNTER — Encounter: Payer: Self-pay | Admitting: Specialist

## 2017-01-14 ENCOUNTER — Emergency Department (HOSPITAL_COMMUNITY)
Admission: EM | Admit: 2017-01-14 | Discharge: 2017-01-14 | Disposition: A | Discharge status: Home / Self Care | Payer: Medicaid Other | Attending: Emergency Medicine | Admitting: Emergency Medicine

## 2017-01-14 DIAGNOSIS — K0889 Other specified disorders of teeth and supporting structures: Secondary | ICD-10-CM

## 2017-01-14 DIAGNOSIS — K047 Periapical abscess without sinus: Secondary | ICD-10-CM

## 2017-01-14 DIAGNOSIS — F17210 Nicotine dependence, cigarettes, uncomplicated: Secondary | ICD-10-CM | POA: Diagnosis not present

## 2017-01-14 DIAGNOSIS — Z7982 Long term (current) use of aspirin: Secondary | ICD-10-CM | POA: Diagnosis not present

## 2017-01-14 DIAGNOSIS — Z79899 Other long term (current) drug therapy: Secondary | ICD-10-CM | POA: Diagnosis not present

## 2017-01-14 MED ORDER — PENICILLIN V POTASSIUM 500 MG PO TABS: 500.0000 mg | ORAL_TABLET | Freq: Four times a day (QID) | ORAL | 0 refills | Status: AC

## 2017-01-14 MED ORDER — TRAMADOL HCL 50 MG PO TABS
50.0000 mg | ORAL_TABLET | Freq: Once | ORAL | Status: AC
  Administered 2017-01-14: 50 mg via ORAL
  Filled 2017-01-14: qty 1

## 2017-01-14 NOTE — ED Triage Notes (Signed)
Pt states he has abscess on right side lower teeth. He has swelling noted to the jaw. Pt reports using salt water rinse at home without improvement.
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
## 2017-01-14 NOTE — Discharge Instructions (Signed)
Take all of your antibiotics as prescribed, do not save or share them. He may continue taking Tylenol and Motrin for discomfort. Follow-up with your dentist or use the resources to help find a dentist. Return to ED for new or worsening symptoms as we discussed  [facility]  [employer] [facility]  [address]  [place], [place] [address]  Phone [phone]  [facility] [employer] [facility] [place] exemplifies the [employer]?s vision to improve the health and quality of life of all [place]ians by Public house manager with a passion to care for the underserved and by leading the nation in community-based, service learning oral health education. We are committed to offering comprehensive general dental services for adults, children and special needs patients in a safe, caring and professional setting.  Appointments: Our clinic is open Monday through Friday 8:00 a.m. until 5:00 p.m. The amount of time scheduled for an appointment depends on the patient?s specific needs. We ask that you keep your appointed time for care or provide 24-hour notice of all appointment changes. Parents or legal guardians must accompany minor children.  Payment for Services: Medicaid and other insurance plans are welcome. Payment for services is due when services are rendered and may be made by cash or credit card. If you have dental insurance, we will assist you with your claim submission.   Emergencies: Emergency services will be provided Monday through Friday on a walk-in basis. Please arrive early for emergency services. After hours emergency services will be provided for patients of record as required.  Services:  Comprehensive General Dentistry  Children?s Dentistry  Oral Surgery - Extractions  Root Canals  Sealants and Tooth Colored Fillings  Crowns and Midwife  3-D/[facility]

## 2017-07-21 LAB — GLUCOSE, POCT (MANUAL RESULT ENTRY): POC Glucose: 125 mg/dl — AB (ref 70–99)

## 2017-09-27 ENCOUNTER — Emergency Department (HOSPITAL_COMMUNITY)
Admission: EM | Admit: 2017-09-27 | Discharge: 2017-09-27 | Disposition: A | Discharge status: Home / Self Care | Payer: Medicaid Other | Attending: Physician Assistant | Admitting: Physician Assistant

## 2017-09-27 ENCOUNTER — Encounter (HOSPITAL_COMMUNITY): Payer: Self-pay

## 2017-09-27 ENCOUNTER — Other Ambulatory Visit: Payer: Self-pay

## 2017-09-27 ENCOUNTER — Emergency Department (HOSPITAL_COMMUNITY): Payer: Medicaid Other

## 2017-09-27 DIAGNOSIS — Z7982 Long term (current) use of aspirin: Secondary | ICD-10-CM | POA: Insufficient documentation

## 2017-09-27 DIAGNOSIS — F17210 Nicotine dependence, cigarettes, uncomplicated: Secondary | ICD-10-CM | POA: Diagnosis not present

## 2017-09-27 DIAGNOSIS — M25561 Pain in right knee: Secondary | ICD-10-CM | POA: Insufficient documentation

## 2017-09-27 MED ORDER — NAPROXEN 250 MG PO TABS
500.0000 mg | ORAL_TABLET | Freq: Once | ORAL | Status: AC
  Administered 2017-09-27: 500 mg via ORAL
  Filled 2017-09-27: qty 2

## 2017-09-27 MED ORDER — NAPROXEN 500 MG PO TABS: 500.0000 mg | ORAL_TABLET | Freq: Two times a day (BID) | ORAL | 0 refills | Status: DC

## 2017-09-27 NOTE — Discharge Instructions (Signed)
You were seen in the emergency department for pain and swelling in your right knee.  Your x-ray did not show any fracture or dislocation.  I have prescribed you naproxen this is a nonsteroidal anti-inflammatory medication.  You may take this once as needed every 12 hours for pain and swelling, be sure to take this with food as it can cause stomach upset and at worst stomach bleeding.  Do not take other nonsteroidal anti-inflammatories while taking this medicine including Goody powder, Advil, Aleve, or Motrin.  You may supplement with Tylenol.  Apply ice to the knee 20 minutes on 40 minutes off for the next 24-48 hours to be sure to use a towel to wrap the ice in.   Purchase a knee sleeve at the drug store to compress the swelling. Be sure to remove this if it is too tight- such as if you start to have tingling or numbness to your feet or they appear pale or blue.   Follow-up with the orthopedic doctor that I have provided in your discharge instructions within 1 week if you have not had improvement in your symptoms.  Return to the emergency department for any new or worsening symptoms including but not limited to fever, chills, numbness, weakness, inability to walk, redness of the knee, increase the pain or swelling.    Your blood pressure was elevated in the emergency department today. You will need to follow up with your primary care doctor to have this rechecked.   Vitals:   09/27/17 1152  BP: (!) 153/91  Pulse: 72  Resp: 18  Temp: 98.2 F (36.8 C)  SpO2: 98%

## 2017-09-27 NOTE — ED Triage Notes (Signed)
Pt states he hit right knee on bird bath last week while doing yard work. Pt states he has been having swelling and pain to knee since injury. Pain is worse with ambulation.
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
## 2017-10-14 ENCOUNTER — Emergency Department (HOSPITAL_COMMUNITY)
Admission: EM | Admit: 2017-10-14 | Discharge: 2017-10-14 | Disposition: A | Discharge status: Home / Self Care | Payer: Medicaid Other | Attending: Emergency Medicine | Admitting: Emergency Medicine

## 2017-10-14 ENCOUNTER — Encounter (HOSPITAL_COMMUNITY): Payer: Self-pay | Admitting: Nurse Practitioner

## 2017-10-14 ENCOUNTER — Emergency Department (HOSPITAL_COMMUNITY): Payer: Medicaid Other

## 2017-10-14 DIAGNOSIS — Z79899 Other long term (current) drug therapy: Secondary | ICD-10-CM | POA: Insufficient documentation

## 2017-10-14 DIAGNOSIS — F17210 Nicotine dependence, cigarettes, uncomplicated: Secondary | ICD-10-CM | POA: Insufficient documentation

## 2017-10-14 DIAGNOSIS — W2209XD Striking against other stationary object, subsequent encounter: Secondary | ICD-10-CM | POA: Diagnosis not present

## 2017-10-14 DIAGNOSIS — M25561 Pain in right knee: Secondary | ICD-10-CM | POA: Diagnosis not present

## 2017-10-14 DIAGNOSIS — Z7982 Long term (current) use of aspirin: Secondary | ICD-10-CM | POA: Insufficient documentation

## 2017-10-14 MED ORDER — NAPROXEN 500 MG PO TABS: 500.0000 mg | ORAL_TABLET | Freq: Two times a day (BID) | ORAL | 0 refills | Status: DC

## 2017-10-14 MED ORDER — TRAMADOL HCL 50 MG PO TABS: 50.0000 mg | ORAL_TABLET | Freq: Four times a day (QID) | ORAL | 0 refills | Status: DC | PRN

## 2017-10-14 MED ORDER — TRAMADOL HCL 50 MG PO TABS
50.0000 mg | ORAL_TABLET | Freq: Once | ORAL | Status: AC
  Administered 2017-10-14: 50 mg via ORAL
  Filled 2017-10-14: qty 1
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
## 2017-10-14 NOTE — Discharge Instructions (Addendum)
Please read and follow all provided instructions.  You have been seen today for pain in your right knee  Tests performed today include: An x-ray of the affected area - does NOT show any broken bones or dislocations.  Vital signs. See below for your results today.  You were given two prescriptions today:  Naproxen-naproxen is a nonsteroidal anti-inflammatory medication.  This will help with pain and swelling.  Take this once every 12 hours with food, as it can cause stomach upset and at worse stomach bleeding.  Do not take other NSAIDs with this (Motrin, Advil, Aleve). Tramadol- this is a stronger pain medication which is safe to take with the Naproxen. Take this for severe pain. Do not drive or operate heavy machinery while taking this medicine Follow-up instructions: Please follow-up with your primary care provider tomorrow for a recheck of your knee and for further evaluation. Return to the emergency department for new or worsening symptoms including, but not limited to worsened pain, fever, chills, nausea, vomiting, worsening swelling, or any other concerns.    Additional Information:  Your vital signs today were: BP (!) 138/97 (BP Location: Left Arm)    Pulse 61    Temp 98.7 F (37.1 C) (Oral)    Resp 16    Ht 6\' 4"  (1.93 m)    Wt 95.3 kg (210 lb)    SpO2 96%    BMI 25.56 kg/m  If your blood pressure (BP) was elevated above 135/85 this visit, please have this repeated by your doctor within one month. ---------------

## 2017-10-14 NOTE — ED Triage Notes (Addendum)
Pt hi right knee last month and states pain and swelling have increased and he has difficulty ambulating or moving knee. Patient has done ice and anti-inflammatory meds without relief of symptoms. Right knee is warm to touch, no redness or streaking noted, knee is edematous.

## 2018-07-27 LAB — GLUCOSE, POCT (MANUAL RESULT ENTRY): POC Glucose: 122 mg/dl — AB (ref 70–99)

## 2018-10-31 ENCOUNTER — Other Ambulatory Visit: Payer: Self-pay

## 2018-10-31 ENCOUNTER — Encounter (HOSPITAL_COMMUNITY): Payer: Self-pay

## 2018-10-31 ENCOUNTER — Emergency Department (HOSPITAL_COMMUNITY)
Admission: EM | Admit: 2018-10-31 | Discharge: 2018-10-31 | Disposition: A | Discharge status: Home / Self Care | Payer: Medicaid Other | Attending: Emergency Medicine | Admitting: Emergency Medicine

## 2018-10-31 DIAGNOSIS — Q828 Other specified congenital malformations of skin: Secondary | ICD-10-CM | POA: Diagnosis not present

## 2018-10-31 DIAGNOSIS — L918 Other hypertrophic disorders of the skin: Secondary | ICD-10-CM

## 2018-10-31 DIAGNOSIS — L910 Hypertrophic scar: Secondary | ICD-10-CM | POA: Diagnosis not present

## 2018-10-31 DIAGNOSIS — T8189XA Other complications of procedures, not elsewhere classified, initial encounter: Secondary | ICD-10-CM

## 2018-10-31 DIAGNOSIS — F17210 Nicotine dependence, cigarettes, uncomplicated: Secondary | ICD-10-CM | POA: Insufficient documentation

## 2018-10-31 DIAGNOSIS — Z7982 Long term (current) use of aspirin: Secondary | ICD-10-CM | POA: Diagnosis not present

## 2018-10-31 DIAGNOSIS — Z189 Retained foreign body fragments, unspecified material: Secondary | ICD-10-CM

## 2018-10-31 DIAGNOSIS — R221 Localized swelling, mass and lump, neck: Secondary | ICD-10-CM | POA: Diagnosis present
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
## 2018-10-31 NOTE — ED Notes (Signed)
Nurse at bedside discharging pt.  

## 2018-10-31 NOTE — ED Notes (Signed)
Patient verbalizes understanding of discharge instructions. Opportunity for questioning and answers were provided. Armband removed by staff, pt discharged from ED ambulatory.   

## 2018-10-31 NOTE — ED Triage Notes (Signed)
Pt arrives POV for a "bump" to the back of his left head. Pt endorses pian and itching. Pt states it's been there for months and blood sometimes drains from it.

## 2018-10-31 NOTE — Discharge Instructions (Addendum)
Return for any new or worsening symptoms.

## 2018-11-16 ENCOUNTER — Emergency Department (HOSPITAL_COMMUNITY): Payer: Medicaid Other

## 2018-11-16 ENCOUNTER — Encounter (HOSPITAL_COMMUNITY): Payer: Self-pay

## 2018-11-16 ENCOUNTER — Encounter: Payer: Self-pay | Admitting: Plastic Surgery

## 2018-11-16 ENCOUNTER — Other Ambulatory Visit: Payer: Self-pay

## 2018-11-16 ENCOUNTER — Ambulatory Visit (INDEPENDENT_AMBULATORY_CARE_PROVIDER_SITE_OTHER): Payer: Medicaid Other | Admitting: Plastic Surgery

## 2018-11-16 ENCOUNTER — Inpatient Hospital Stay (HOSPITAL_COMMUNITY)
Admission: EM | Admit: 2018-11-16 | Discharge: 2018-11-30 | DRG: 220 | Disposition: A | Discharge status: Home / Self Care | Payer: Medicaid Other | Attending: Surgery | Admitting: Surgery

## 2018-11-16 DIAGNOSIS — F1410 Cocaine abuse, uncomplicated: Secondary | ICD-10-CM | POA: Diagnosis present

## 2018-11-16 DIAGNOSIS — Z7982 Long term (current) use of aspirin: Secondary | ICD-10-CM

## 2018-11-16 DIAGNOSIS — I739 Peripheral vascular disease, unspecified: Secondary | ICD-10-CM | POA: Diagnosis present

## 2018-11-16 DIAGNOSIS — Z79899 Other long term (current) drug therapy: Secondary | ICD-10-CM | POA: Diagnosis not present

## 2018-11-16 DIAGNOSIS — I719 Aortic aneurysm of unspecified site, without rupture: Secondary | ICD-10-CM | POA: Diagnosis present

## 2018-11-16 DIAGNOSIS — I700 Atherosclerosis of aorta: Secondary | ICD-10-CM | POA: Diagnosis present

## 2018-11-16 DIAGNOSIS — Z452 Encounter for adjustment and management of vascular access device: Secondary | ICD-10-CM

## 2018-11-16 DIAGNOSIS — I129 Hypertensive chronic kidney disease with stage 1 through stage 4 chronic kidney disease, or unspecified chronic kidney disease: Secondary | ICD-10-CM | POA: Diagnosis present

## 2018-11-16 DIAGNOSIS — Z7151 Drug abuse counseling and surveillance of drug abuser: Secondary | ICD-10-CM | POA: Diagnosis not present

## 2018-11-16 DIAGNOSIS — N179 Acute kidney failure, unspecified: Secondary | ICD-10-CM | POA: Diagnosis present

## 2018-11-16 DIAGNOSIS — R072 Precordial pain: Secondary | ICD-10-CM

## 2018-11-16 DIAGNOSIS — F17210 Nicotine dependence, cigarettes, uncomplicated: Secondary | ICD-10-CM | POA: Diagnosis present

## 2018-11-16 DIAGNOSIS — I712 Thoracic aortic aneurysm, without rupture: Secondary | ICD-10-CM | POA: Diagnosis present

## 2018-11-16 DIAGNOSIS — I10 Essential (primary) hypertension: Secondary | ICD-10-CM | POA: Diagnosis present

## 2018-11-16 DIAGNOSIS — Z23 Encounter for immunization: Secondary | ICD-10-CM

## 2018-11-16 DIAGNOSIS — Z716 Tobacco abuse counseling: Secondary | ICD-10-CM | POA: Diagnosis not present

## 2018-11-16 DIAGNOSIS — F17200 Nicotine dependence, unspecified, uncomplicated: Secondary | ICD-10-CM | POA: Diagnosis present

## 2018-11-16 DIAGNOSIS — IMO0001 Reserved for inherently not codable concepts without codable children: Secondary | ICD-10-CM | POA: Diagnosis present

## 2018-11-16 DIAGNOSIS — H5461 Unqualified visual loss, right eye, normal vision left eye: Secondary | ICD-10-CM | POA: Diagnosis present

## 2018-11-16 DIAGNOSIS — Z79891 Long term (current) use of opiate analgesic: Secondary | ICD-10-CM

## 2018-11-16 DIAGNOSIS — I7100 Dissection of unspecified site of aorta: Secondary | ICD-10-CM

## 2018-11-16 DIAGNOSIS — Z87891 Personal history of nicotine dependence: Secondary | ICD-10-CM | POA: Diagnosis not present

## 2018-11-16 DIAGNOSIS — L910 Hypertrophic scar: Secondary | ICD-10-CM

## 2018-11-16 DIAGNOSIS — F1490 Cocaine use, unspecified, uncomplicated: Secondary | ICD-10-CM | POA: Diagnosis not present

## 2018-11-16 DIAGNOSIS — I7101 Dissection of thoracic aorta: Secondary | ICD-10-CM | POA: Diagnosis present

## 2018-11-16 DIAGNOSIS — Z720 Tobacco use: Secondary | ICD-10-CM | POA: Diagnosis not present

## 2018-11-16 HISTORY — DX: Hypertrophic scar: L91.0

## 2018-11-16 HISTORY — DX: Aortic aneurysm of unspecified site, without rupture: I71.9

## 2018-11-16 LAB — CBC
HCT: 46.5 % (ref 39.0–52.0)
Hemoglobin: 14.6 g/dL (ref 13.0–17.0)
MCH: 31.7 pg (ref 26.0–34.0)
MCHC: 31.4 g/dL (ref 30.0–36.0)
MCV: 101.1 fL — ABNORMAL HIGH (ref 80.0–100.0)
Platelets: 253 10*3/uL (ref 150–400)
RBC: 4.6 MIL/uL (ref 4.22–5.81)
RDW: 12.6 % (ref 11.5–15.5)
WBC: 9.2 10*3/uL (ref 4.0–10.5)
nRBC: 0 % (ref 0.0–0.2)

## 2018-11-16 LAB — BASIC METABOLIC PANEL
Anion gap: 11 (ref 5–15)
BUN: 14 mg/dL (ref 8–23)
CALCIUM: 9.1 mg/dL (ref 8.9–10.3)
CO2: 26 mmol/L (ref 22–32)
Chloride: 101 mmol/L (ref 98–111)
Creatinine, Ser: 1.07 mg/dL (ref 0.61–1.24)
GFR calc Af Amer: >60 mL/min (ref >60)
GFR calc non Af Amer: >60 mL/min (ref >60)
Glucose, Bld: 123 mg/dL — ABNORMAL HIGH (ref 70–99)
Potassium: 4 mmol/L (ref 3.5–5.1)
Sodium: 138 mmol/L (ref 135–145)

## 2018-11-16 LAB — I-STAT TROPONIN, ED: Troponin i, poc: 0 ng/mL (ref 0.00–0.08)

## 2018-11-16 MED ORDER — ESMOLOL HCL-SODIUM CHLORIDE 2000 MG/100ML IV SOLN
25.0000 ug/kg/min | INTRAVENOUS | Status: DC
  Administered 2018-11-16: 25 ug/kg/min via INTRAVENOUS
  Administered 2018-11-17: 50 ug/kg/min via INTRAVENOUS
  Filled 2018-11-16 (×2): qty 100

## 2018-11-16 MED ORDER — LABETALOL HCL 5 MG/ML IV SOLN
10.0000 mg | INTRAVENOUS | Status: DC | PRN
  Administered 2018-11-17 (×2): 10 mg via INTRAVENOUS
  Filled 2018-11-16: qty 4

## 2018-11-16 MED ORDER — OXYCODONE-ACETAMINOPHEN 5-325 MG PO TABS
1.0000 | ORAL_TABLET | ORAL | Status: DC | PRN
  Administered 2018-11-17 – 2018-11-23 (×28): 2 via ORAL
  Filled 2018-11-16 (×29): qty 2

## 2018-11-16 MED ORDER — ENOXAPARIN SODIUM 40 MG/0.4ML ~~LOC~~ SOLN
40.0000 mg | SUBCUTANEOUS | Status: DC
  Administered 2018-11-18 – 2018-11-29 (×13): 40 mg via SUBCUTANEOUS
  Filled 2018-11-16 (×14): qty 0.4

## 2018-11-16 MED ORDER — IOPAMIDOL (ISOVUE-370) INJECTION 76%
100.0000 mL | Freq: Once | INTRAVENOUS | Status: AC | PRN
  Administered 2018-11-16: 100 mL via INTRAVENOUS

## 2018-11-16 MED ORDER — ONDANSETRON HCL 4 MG/2ML IJ SOLN
4.0000 mg | Freq: Once | INTRAMUSCULAR | Status: AC
  Administered 2018-11-16: 4 mg via INTRAVENOUS
  Filled 2018-11-16: qty 2

## 2018-11-16 MED ORDER — MORPHINE SULFATE (PF) 2 MG/ML IV SOLN
2.0000 mg | INTRAVENOUS | Status: DC | PRN
  Administered 2018-11-17: 4 mg via INTRAVENOUS
  Administered 2018-11-17 – 2018-11-23 (×12): 2 mg via INTRAVENOUS
  Filled 2018-11-16 (×3): qty 1
  Filled 2018-11-16: qty 2
  Filled 2018-11-16 (×10): qty 1

## 2018-11-16 MED ORDER — MORPHINE SULFATE (PF) 4 MG/ML IV SOLN
4.0000 mg | Freq: Once | INTRAVENOUS | Status: AC
  Administered 2018-11-16: 4 mg via INTRAVENOUS
  Filled 2018-11-16: qty 1

## 2018-11-16 MED ORDER — HYDRALAZINE HCL 20 MG/ML IJ SOLN
5.0000 mg | INTRAMUSCULAR | Status: DC | PRN
  Administered 2018-11-16: 5 mg via INTRAVENOUS
  Filled 2018-11-16: qty 1

## 2018-11-16 MED ORDER — PHENOL 1.4 % MT LIQD: 1.0000 | OROMUCOSAL | Status: DC | PRN

## 2018-11-16 MED ORDER — GUAIFENESIN-DM 100-10 MG/5ML PO SYRP
15.0000 mL | ORAL_SOLUTION | ORAL | Status: DC | PRN
  Administered 2018-11-17 – 2018-11-29 (×4): 15 mL via ORAL
  Filled 2018-11-16 (×4): qty 15

## 2018-11-16 MED ORDER — KCL IN DEXTROSE-NACL 20-5-0.45 MEQ/L-%-% IV SOLN
INTRAVENOUS | Status: DC
  Administered 2018-11-17: via INTRAVENOUS
  Administered 2018-11-17: 100 mL/h via INTRAVENOUS
  Administered 2018-11-18 – 2018-11-21 (×8): via INTRAVENOUS
  Filled 2018-11-16 (×10): qty 1000

## 2018-11-16 MED ORDER — IOPAMIDOL (ISOVUE-370) INJECTION 76%
INTRAVENOUS | Status: AC
  Filled 2018-11-16: qty 100

## 2018-11-16 MED ORDER — POTASSIUM CHLORIDE CRYS ER 20 MEQ PO TBCR: 20.0000 meq | EXTENDED_RELEASE_TABLET | Freq: Once | ORAL | Status: DC

## 2018-11-16 MED ORDER — SODIUM CHLORIDE 0.9% FLUSH
3.0000 mL | Freq: Once | INTRAVENOUS | Status: AC
  Administered 2018-11-16: 3 mL via INTRAVENOUS

## 2018-11-16 MED ORDER — PANTOPRAZOLE SODIUM 40 MG PO TBEC
40.0000 mg | DELAYED_RELEASE_TABLET | Freq: Every day | ORAL | Status: DC
  Administered 2018-11-17 – 2018-11-30 (×14): 40 mg via ORAL
  Filled 2018-11-16 (×14): qty 1

## 2018-11-16 MED ORDER — ONDANSETRON HCL 4 MG/2ML IJ SOLN: 4.0000 mg | Freq: Four times a day (QID) | INTRAMUSCULAR | Status: DC | PRN

## 2018-11-16 MED ORDER — ALUM & MAG HYDROXIDE-SIMETH 200-200-20 MG/5ML PO SUSP: 15.0000 mL | ORAL | Status: DC | PRN

## 2018-11-16 NOTE — ED Notes (Signed)
Patient transported to CT 
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
## 2018-11-16 NOTE — ED Notes (Signed)
Phone number for next of kin [place] [phone]

## 2018-11-16 NOTE — ED Triage Notes (Signed)
Pt reports left side chest pain that started 2 days ago. Denies cough. Reports associated shortness of breath. Denies n/v. Denies cardiac hx.
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
## 2018-11-17 ENCOUNTER — Encounter (HOSPITAL_COMMUNITY): Payer: Self-pay | Admitting: *Deleted

## 2018-11-17 ENCOUNTER — Inpatient Hospital Stay (HOSPITAL_COMMUNITY): Payer: Medicaid Other

## 2018-11-17 DIAGNOSIS — F17200 Nicotine dependence, unspecified, uncomplicated: Secondary | ICD-10-CM

## 2018-11-17 DIAGNOSIS — F1410 Cocaine abuse, uncomplicated: Secondary | ICD-10-CM

## 2018-11-17 DIAGNOSIS — I10 Essential (primary) hypertension: Secondary | ICD-10-CM

## 2018-11-17 DIAGNOSIS — R072 Precordial pain: Secondary | ICD-10-CM

## 2018-11-17 DIAGNOSIS — IMO0001 Reserved for inherently not codable concepts without codable children: Secondary | ICD-10-CM

## 2018-11-17 HISTORY — DX: Nicotine dependence, unspecified, uncomplicated: F17.200

## 2018-11-17 HISTORY — DX: Essential (primary) hypertension: I10

## 2018-11-17 HISTORY — DX: Reserved for inherently not codable concepts without codable children: IMO0001

## 2018-11-17 HISTORY — DX: Cocaine abuse, uncomplicated: F14.10

## 2018-11-17 LAB — CBC
HCT: 43.2 % (ref 39.0–52.0)
Hemoglobin: 14 g/dL (ref 13.0–17.0)
MCH: 32.2 pg (ref 26.0–34.0)
MCHC: 32.4 g/dL (ref 30.0–36.0)
MCV: 99.3 fL (ref 80.0–100.0)
Platelets: 242 10*3/uL (ref 150–400)
RBC: 4.35 MIL/uL (ref 4.22–5.81)
RDW: 12.6 % (ref 11.5–15.5)
WBC: 9.6 10*3/uL (ref 4.0–10.5)
nRBC: 0 % (ref 0.0–0.2)

## 2018-11-17 LAB — URINALYSIS, ROUTINE W REFLEX MICROSCOPIC
Bilirubin Urine: NEGATIVE
GLUCOSE, UA: NEGATIVE mg/dL
Hgb urine dipstick: NEGATIVE
Ketones, ur: NEGATIVE mg/dL
Leukocytes,Ua: NEGATIVE
Nitrite: NEGATIVE
Protein, ur: NEGATIVE mg/dL
Specific Gravity, Urine: >1.046 — ABNORMAL HIGH (ref 1.005–1.030)
pH: 6 (ref 5.0–8.0)

## 2018-11-17 LAB — CREATININE, SERUM
Creatinine, Ser: 1.01 mg/dL (ref 0.61–1.24)
GFR calc Af Amer: >60 mL/min (ref >60)
GFR calc non Af Amer: >60 mL/min (ref >60)

## 2018-11-17 LAB — RAPID URINE DRUG SCREEN, HOSP PERFORMED
Amphetamines: NOT DETECTED
Barbiturates: NOT DETECTED
Benzodiazepines: NOT DETECTED
Cocaine: POSITIVE — AB
Opiates: POSITIVE — AB
Tetrahydrocannabinol: NOT DETECTED

## 2018-11-17 LAB — HIV ANTIBODY (ROUTINE TESTING W REFLEX): HIV SCREEN 4TH GENERATION: NONREACTIVE

## 2018-11-17 LAB — PROTIME-INR
INR: 0.94
PROTHROMBIN TIME: 12.5 s (ref 11.4–15.2)

## 2018-11-17 LAB — ECHOCARDIOGRAM COMPLETE
Height: 76 in
WEIGHTICAEL: 3232 [oz_av]

## 2018-11-17 MED ORDER — LABETALOL HCL 5 MG/ML IV SOLN: 10.0000 mg | INTRAVENOUS | Status: DC | PRN

## 2018-11-17 MED ORDER — LABETALOL HCL 200 MG PO TABS
200.0000 mg | ORAL_TABLET | Freq: Two times a day (BID) | ORAL | Status: DC
  Administered 2018-11-17 – 2018-11-23 (×11): 200 mg via ORAL
  Filled 2018-11-17 (×13): qty 1

## 2018-11-17 MED ORDER — LORAZEPAM 2 MG/ML IJ SOLN: 2.0000 mg | INTRAMUSCULAR | Status: DC | PRN

## 2018-11-17 MED ORDER — LISINOPRIL 10 MG PO TABS
10.0000 mg | ORAL_TABLET | Freq: Every day | ORAL | Status: DC
  Administered 2018-11-17 – 2018-11-18 (×2): 10 mg via ORAL
  Filled 2018-11-17 (×2): qty 1

## 2018-11-17 NOTE — Consult Note (Signed)
Date: 11/17/2018               Patient Name:  Garrett Fleming MRN: [id number]  DOB: 16-Nov-1955 Age / Sex: 63 y.o., male   PCP: [facility], Garrett Fleming [facility]         Requesting Physician: Dr. Edilia Fleming, Garrett Fleming, *    Consulting Reason:  Evaluation of co morbidities in a patient with an ulcer in the thoracic aorta      Chief Complaint: chest pain   History of Present Illness: Garrett Fleming is a 63 year old man who uses tobacco and alcohol use daily presented 2/18 with sudden onset of chest pain radiating to the back which began two days prior to admission. The chest pain was worsened by deep breathing and was associated with shortness of breath. The pain was improved but not resolved with pain medications given in the emergency department.   In the ED he was found to have a blood pressure of 156/96, HR 68, and was afebrile. A CT angio chest was obtained to evaluate for PE and showed a penetrating ulcer in the thoracic aorta. Labs were remarkable for normal renal function, hemoglobin 14, intial troponin 0. UDS was positive for cocaine.   He received IV morphine, and was given a dose of IV hydralazine then started on IV esmolol.  Vascular surgery admit the patient to stepdown and IMTS was consulted to work up comorbid conditions and control blood pressure. He was weaned off of IV esmolol this morning and has been switched to IV labetalol 10 mg PRN. Percocet is ordered for pain control.   Meds: Current Meds  Medication Sig  . acetaminophen (TYLENOL) 500 MG tablet Take 500 mg by mouth every 6 (six) hours as needed for mild pain or moderate pain.    Allergies: Allergies as of 11/16/2018  . (No Known Allergies)   Past Surgical History:  Procedure Laterality Date  . ORIF ANKLE FRACTURE Left 12/02/2013   Procedure: OPEN REDUCTION INTERNAL FIXATION (ORIF) LEFT ANKLE FRACTURE;  Surgeon: Kathryne Hitch, MD;  Location: [facility] ORS;  Service: Orthopedics;  Laterality: Left;  . right eye surgery    1977   Social history:  Lives at home with his wife. Smokes 5 cigarettes per week for the past 30 years. Drinks 2-3 40 ounce beers per day. Uses cocaine about twice per month. Denies other drug use. Works in [employer] and lives in [place].   Review of Systems: A complete ROS was negative except as per HPI.  Physical Exam: Blood pressure 110/80, pulse (!) 51, temperature 97.9 F (36.6 C), temperature source Oral, resp. rate (!) 9, height 6\' 4"  (1.93 m), weight 91.6 kg, SpO2 98 %. General: well appearing, not in acute distress  Cardiac: regular rate and rhythm, no murmur, radial pulses equal Pulm: normal work of breathing, lungs clear to auscultation Extremities: no cyanosis, no edema  Neuro: awake, alert, oriented x3 Skin: vertical keloid over the right posterior shoulder   Lab results: @LABTEST @  Imaging results:  Dg Chest 2 View  Result Date: 11/16/2018 CLINICAL DATA:  Short of breath and chest pain EXAM: CHEST - 2 VIEW COMPARISON:  None. FINDINGS: Borderline cardiomegaly. Normal vascularity. Low lung volumes with bibasilar atelectasis. No pneumothorax or pleural effusion. IMPRESSION: Borderline cardiomegaly without decompensation. Bibasilar atelectasis. Electronically Signed   By: Jolaine Click M.D.   On: 11/16/2018 15:40   Ct Angio Chest Pe W/cm &/or Wo Cm  Result Date: 11/16/2018 CLINICAL DATA:  Left-sided chest  pain EXAM: CT ANGIOGRAPHY CHEST WITH CONTRAST TECHNIQUE: Multidetector CT imaging of the chest was performed using the standard protocol during bolus administration of intravenous contrast. Multiplanar CT image reconstructions and MIPs were obtained to evaluate the vascular anatomy. CONTRAST:  70 mL Isovue 370 intravenous COMPARISON:  Chest x-ray 11/16/2018 FINDINGS: Cardiovascular: Satisfactory opacification of the pulmonary arteries to the segmental level. No evidence of pulmonary embolism. Mild aneurysmal dilatation of the ascending aorta measuring up to 4.1 cm. Mild  aortic atherosclerosis. 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 Crescent shaped mural thickening extends to the proximal descending thoracic aorta. Mediastinum/Nodes: Midline trachea. No thyroid mass. No significant adenopathy. Esophagus unremarkable Lungs/Pleura: Trace left pleural effusion. No focal consolidation. Atelectasis at the bases Upper Abdomen: No acute abnormality. Musculoskeletal: No chest wall abnormality. No acute or significant osseous findings. Review of the MIP images confirms the above findings. IMPRESSION: 1. Negative for acute pulmonary embolus 2. Findings suspicious for penetrating aortic ulcer involving the distal arch. This is associated with crescent shaped mural density that extends just distal to the left subclavian artery takeoff to the proximal descending thoracic aorta and would be consistent with acute intramural hematoma. 3. Ascending aortic aneurysm measuring up to 4.1 cm. 4. Trace left pleural effusion Critical Value/emergent results were called by telephone at the time of interpretation on 11/16/2018 at 10:21 pm to Dr. Cathren Laine , who verbally acknowledged these results. Aortic Atherosclerosis (ICD10-I70.0). Electronically Signed   By: Jasmine Pang M.D.   On: 11/16/2018 22:22    Other results: EKG: sinus rhythm, no acute ischemic changes, unchanged from prior   Assessment, Plan, & Recommendations by Problem: Active Problems:   Penetrating atherosclerotic ulcer of aorta (HCC)  Penetrating atherosclerotic ulcer of the aorta  63 year old man with no known medical history presents with chest pain radiating to the back with associated shortness of breath and was found to have a penetrating aortic ulcer. He was hypertensive at presentation and UDS  noted cocaine. Initially he required IV esmolol but he has been transitioned to PRN IV labetalol and hydralazine. He was admit under the care of the vascular surgery team and IMTS was consulted for risk factor evaluation and modification. Penetrating aortic ulcers are typically associated with atherosclerotic changes in the adjacent aortic wall so we will evaluate for underlying conditions which could cause atherosclerotic disease. Vascular surgery plans to repeat the CTA of the chest abdomen and pelvis tomorrow.  - IV labetalol 10 mg and hydral 5 mg PRN ordered  - start lisinopril 10 mg daily with hold parameters given current IV medications  - obtain lipid panel  - obtain hemoglobin A1c  - will continue to encourage cocaine and tobacco cessation, will consult social work for more assistance regarding this   ETOH use  Patient reports drinking two to four 40 ounce beers daily, last drink was on the evening of 2/17. He denies history of withdrawal or seizure.  - CIWA monitoring with PRN ativan   Signed: Eulah Pont, MD 11/17/2018, 9:07 AM

## 2018-11-17 NOTE — Progress Notes (Signed)
    Subjective  -   Says he is now pain free   Physical Exam:  abd soft and non tender Palpable pedal pulses Palpable radial pulses       Assessment/Plan:    PAU with IMH:  The patient is currently pain free with normal blood pressure.  No acute intervention is recommended.  He will need a repeat CTA of the chest abdomen and pelvis to make sure there is no progression.  In addition his iliac arteries have not been imaged.   I have asked the teaching service to assist in his care to make sure he is on the appropriate medical therapy.  Wells Amor Packard 11/17/2018 8:48 AM --  Vitals:   11/17/18 0730 11/17/18 0800  BP: 122/86 110/80  Pulse: (!) 53 (!) 51  Resp: 11 (!) 9  Temp:    SpO2: 95% 98%    Intake/Output Summary (Last 24 hours) at 11/17/2018 0848 Last data filed at 11/17/2018 0437 Gross per 24 hour  Intake 98.16 ml  Output 350 ml  Net -251.84 ml     Laboratory CBC    Component Value Date/Time   WBC 9.6 11/16/2018 2342   HGB 14.0 11/16/2018 2342   HCT 43.2 11/16/2018 2342   PLT 242 11/16/2018 2342    BMET    Component Value Date/Time   NA 138 11/16/2018 1450   K 4.0 11/16/2018 1450   CL 101 11/16/2018 1450   CO2 26 11/16/2018 1450   GLUCOSE 123 (H) 11/16/2018 1450   BUN 14 11/16/2018 1450   CREATININE 1.01 11/16/2018 2342   CALCIUM 9.1 11/16/2018 1450   GFRNONAA >60 11/16/2018 2342   GFRAA >60 11/16/2018 2342    COAG Lab Results  Component Value Date   INR 0.94 11/16/2018   No results found for: PTT  Antibiotics Anti-infectives (From admission, onward)   None       V. Charlena Cross, M.D. [facility] [place] Office: [phone] Pager:  [phone]

## 2018-11-17 NOTE — Progress Notes (Signed)
11/17/2018  1700 Received pt to room 4E-24 from ED.  Pt is A&O, some C/O pain in chest and back with movement.  Tele monitor placed and CCMD notified.  CHG bath given.  Oriented to room, call ight and bed.  Call bell in reach, family at bedside. Kathryne Hitch

## 2018-11-17 NOTE — Progress Notes (Signed)
  Echocardiogram 2D Echocardiogram has been performed.  Gerda Diss 11/17/2018, 1:55 PM

## 2018-11-17 NOTE — ED Notes (Signed)
Hospital bed ordered for the pt 

## 2018-11-17 NOTE — Progress Notes (Signed)
   VASCULAR SURGERY ASSESSMENT & PLAN:   INTRAMURAL HEMATOMA THORACIC AORTA: The patient's blood pressure is now under better control.  He is now pain-free.  He is off esmolol and is waiting on a stepdown bed.  I have asked Dr. Myra Gianotti to review his films and determine if he is a candidate for a thoracic stent graft to address his penetrating ulcer.  I also discussed the aneurysm of the ascending aorta with the cardiothoracic surgeons and this would not likely need to be followed by them until it reaches 4.8 cm in diameter unless there are other compounding factors such as a bicuspid aortic valve.  For this reason I will order a 2D echo.  SUBJECTIVE:   No pain this morning.  PHYSICAL EXAM:   Vitals:   11/17/18 0630 11/17/18 0645 11/17/18 0700 11/17/18 0730  BP: 108/79 106/76 108/76 122/86  Pulse: (!) 52 (!) 53 (!) 53 (!) 53  Resp:  14  11  Temp:      TempSrc:      SpO2: 91% 93% 96% 95%  Weight:      Height:       Lungs clear.  LABS:   Lab Results  Component Value Date   WBC 9.6 11/16/2018   HGB 14.0 11/16/2018   HCT 43.2 11/16/2018   MCV 99.3 11/16/2018   PLT 242 11/16/2018   Lab Results  Component Value Date   CREATININE 1.01 11/16/2018   Lab Results  Component Value Date   INR 0.94 11/16/2018   CBG (last 3)  No results for input(s): GLUCAP in the last 72 hours.  PROBLEM LIST:    Active Problems:   Penetrating atherosclerotic ulcer of aorta (HCC)   CURRENT MEDS:   . enoxaparin (LOVENOX) injection  40 mg Subcutaneous Q24H  . pantoprazole  40 mg Oral Daily  . potassium chloride  20-40 mEq Oral Once    Waverly Ferrari Beeper: [phone] Office: [phone] 11/17/2018

## 2018-11-17 NOTE — ED Notes (Signed)
ECHO being completed at this time, will continue to monitor BP to ensure it Is not trending up

## 2018-11-17 NOTE — ED Notes (Signed)
Report given  To Garrett Fleming on 4e

## 2018-11-18 ENCOUNTER — Encounter: Payer: Self-pay | Admitting: Surgery

## 2018-11-18 ENCOUNTER — Other Ambulatory Visit: Payer: Self-pay

## 2018-11-18 LAB — LIPID PANEL
Cholesterol: 162 mg/dL (ref 0–200)
HDL: 60 mg/dL (ref >40)
LDL Cholesterol: 93 mg/dL (ref 0–99)
Total CHOL/HDL Ratio: 2.7 RATIO
Triglycerides: 44 mg/dL (ref <150)
VLDL: 9 mg/dL (ref 0–40)

## 2018-11-18 LAB — TROPONIN I
Troponin I: <0.03 ng/mL (ref <0.03)
Troponin I: <0.03 ng/mL (ref <0.03)
Troponin I: <0.03 ng/mL (ref <0.03)

## 2018-11-18 LAB — BASIC METABOLIC PANEL
Anion gap: 11 (ref 5–15)
BUN: 14 mg/dL (ref 8–23)
CO2: 23 mmol/L (ref 22–32)
Calcium: 8.1 mg/dL — ABNORMAL LOW (ref 8.9–10.3)
Chloride: 102 mmol/L (ref 98–111)
Creatinine, Ser: 1.32 mg/dL — ABNORMAL HIGH (ref 0.61–1.24)
GFR calc Af Amer: >60 mL/min (ref >60)
GFR calc non Af Amer: 58 mL/min — ABNORMAL LOW (ref >60)
Glucose, Bld: 128 mg/dL — ABNORMAL HIGH (ref 70–99)
Potassium: 4.4 mmol/L (ref 3.5–5.1)
SODIUM: 136 mmol/L (ref 135–145)

## 2018-11-18 MED ORDER — PNEUMOCOCCAL VAC POLYVALENT 25 MCG/0.5ML IJ INJ
0.5000 mL | INJECTION | INTRAMUSCULAR | Status: AC
  Administered 2018-11-20: 0.5 mL via INTRAMUSCULAR
  Filled 2018-11-18: qty 0.5

## 2018-11-18 MED ORDER — ATORVASTATIN CALCIUM 10 MG PO TABS
20.0000 mg | ORAL_TABLET | Freq: Every day | ORAL | Status: DC
  Administered 2018-11-18 – 2018-11-29 (×12): 20 mg via ORAL
  Filled 2018-11-18 (×12): qty 2

## 2018-11-18 MED ORDER — LABETALOL HCL 5 MG/ML IV SOLN
10.0000 mg | INTRAVENOUS | Status: DC | PRN
  Administered 2018-11-18: 10 mg via INTRAVENOUS
  Filled 2018-11-18: qty 4

## 2018-11-18 NOTE — Progress Notes (Signed)
   Subjective: Endorsing some epigastric pain and dizziness when walking to the bathroom. Feels a little down this morning but unable to express specific symptoms. Reiterated the importance of cocaine cessation. Discussed plan for continued blood pressure control and explained that this tight BP control may be contributing to his dizziness.    Objective:  Vital signs in last 24 hours: Vitals:   11/18/18 0100 11/18/18 0300 11/18/18 0742 11/18/18 0902  BP: 101/68 108/62 107/64 101/69  Pulse: 66 71 64 70  Resp: 14 14 13 15   Temp:   99.3 F (37.4 C) 99.7 F (37.6 C)  TempSrc:   Oral Oral  SpO2: 95% 93% 90% 94%  Weight:      Height:       Gen: laying in bed, NAD Cardiac: RRR, no m/r/g, no carotic bruit, bilateral radial and DP pulses are strong and symmetric  Pulm: CTAB Abd: soft, NT, ND  Assessment/Plan:  Principal Problem:   Penetrating atherosclerotic ulcer of aorta (HCC) Active Problems:   Essential hypertension   Smoking   Cocaine abuse (HCC)  63 year old man with newly diagnosed HTN, h/o tobacco and cocaine use p/w with chest pain radiating to the back with associated shortness of breath and was found to have a penetrating aortic ulcer with intraluminal hematoma. We have been consulted for BP management.   HTN: well controlled overnight. Currently on lisinopril 10mg  daily, po labetolol 100mg  bid. Received prn IV labetalol twice for SBP in the 140s. BMP this morning shows an AKI, creatine 1.32 from 1.01. could be due to contrast load or lisinopril. Doubt prerenal since BUN did not increase.  - discontinue lisinopril in the setting of AKI, blood pressure has been very well controlled - continue po labetolol 100mg  bid - prn IV labetalol for SBP > 130  - repeat bmp tomorrow  - lipid panel reasonable well controlled but given other risk factors, he has a 13.6% chance of cardiovascular event in the next 10 years. Will start atorvastatin 20mg  daily   AKI:  - d/c lisinopril -  repeat BMP tomorrow   Dispo: Anticipated discharge per vascular surgery   Ali Lowe, MD 11/18/2018, 9:44 AM Pager: [phone]

## 2018-11-18 NOTE — Progress Notes (Addendum)
[facility] [place]  Subjective  - Doing OK no new complaints.   Objective 108/62 71 99.3 F (37.4 C) (Oral) 14 93%  Intake/Output Summary (Last 24 hours) at 11/18/2018 0741 Last data filed at 11/18/2018 0435 Gross per 24 hour  Intake 905 ml  Output 1175 ml  Net -270 ml    Palpable DP/ radial pulses B Abdomin soft non tender to palpation, tolerating PO's. Heart RRR Lungs non labored breathing  Assessment/Planning: Intramural  Hematoma thoracici oorta   BP controlled  Will order CTA chest/Ab/Pelvis  Garrett Fleming 11/18/2018 7:41 AM --  Laboratory Lab Results: Recent Labs    11/16/18 1450 11/16/18 2342  WBC 9.2 9.6  HGB 14.6 14.0  HCT 46.5 43.2  PLT 253 242   BMET Recent Labs    11/16/18 1450 11/16/18 2342  NA 138  --   K 4.0  --   CL 101  --   CO2 26  --   GLUCOSE 123*  --   BUN 14  --   CREATININE 1.07 1.01  CALCIUM 9.1  --     COAG Lab Results  Component Value Date   INR 0.94 11/16/2018   No results found for: PTT  Agree with the above Having mild chest pain this am.  BP stable overnight No abdominal pain  Palpable pedal pulses Abd soft  Plan: Chest pain:  Suspect related to PAU, will order troponin and EKG PAU:  Repeat CTA c/a/p in am HTN:  Appreciate Medicine assistance  Chol:  Normal.  LDL 93  HDL 60.  Will defer to medicine regarding need for low dose statin therapy   Garrett Fleming

## 2018-11-19 ENCOUNTER — Inpatient Hospital Stay (HOSPITAL_COMMUNITY): Payer: Medicaid Other

## 2018-11-19 ENCOUNTER — Encounter (HOSPITAL_COMMUNITY): Payer: Self-pay

## 2018-11-19 LAB — CBC
HCT: 37.6 % — ABNORMAL LOW (ref 39.0–52.0)
Hemoglobin: 11.7 g/dL — ABNORMAL LOW (ref 13.0–17.0)
MCH: 31.7 pg (ref 26.0–34.0)
MCHC: 31.1 g/dL (ref 30.0–36.0)
MCV: 101.9 fL — ABNORMAL HIGH (ref 80.0–100.0)
Platelets: 204 10*3/uL (ref 150–400)
RBC: 3.69 MIL/uL — ABNORMAL LOW (ref 4.22–5.81)
RDW: 12.2 % (ref 11.5–15.5)
WBC: 6.7 10*3/uL (ref 4.0–10.5)
nRBC: 0 % (ref 0.0–0.2)

## 2018-11-19 LAB — BASIC METABOLIC PANEL
Anion gap: 6 (ref 5–15)
BUN: 12 mg/dL (ref 8–23)
CO2: 27 mmol/L (ref 22–32)
Calcium: 8.4 mg/dL — ABNORMAL LOW (ref 8.9–10.3)
Chloride: 103 mmol/L (ref 98–111)
Creatinine, Ser: 1.12 mg/dL (ref 0.61–1.24)
GFR calc Af Amer: >60 mL/min (ref >60)
GFR calc non Af Amer: >60 mL/min (ref >60)
Glucose, Bld: 118 mg/dL — ABNORMAL HIGH (ref 70–99)
Potassium: 4.8 mmol/L (ref 3.5–5.1)
Sodium: 136 mmol/L (ref 135–145)

## 2018-11-19 LAB — HEMOGLOBIN A1C
Hgb A1c MFr Bld: 4.9 % (ref 4.8–5.6)
MEAN PLASMA GLUCOSE: 94 mg/dL

## 2018-11-19 MED ORDER — IOPAMIDOL (ISOVUE-370) INJECTION 76%
INTRAVENOUS | Status: AC
  Filled 2018-11-19: qty 100

## 2018-11-19 MED ORDER — IOPAMIDOL (ISOVUE-370) INJECTION 76%
100.0000 mL | Freq: Once | INTRAVENOUS | Status: AC | PRN
  Administered 2018-11-19: 100 mL via INTRAVENOUS

## 2018-11-19 NOTE — Progress Notes (Signed)
    Subjective  -   Not having pain but did take pain meds   Physical Exam:  Palpable pedal pulses abd soft   CTA shows increase in size of PAU    Assessment/Plan:    Today's CTA shows progression of the PAU.  He will need repair during this hospitalization.  Continue with pain control and BP control over the weekend  Garrett Fleming 11/19/2018 4:51 PM --  Vitals:   11/19/18 1245 11/19/18 1501  BP: 117/65 121/76  Pulse: 66 63  Resp: 16   Temp: 98.9 F (37.2 C)   SpO2:      Intake/Output Summary (Last 24 hours) at 11/19/2018 1651 Last data filed at 11/19/2018 1500 Gross per 24 hour  Intake 4083.33 ml  Output 2350 ml  Net 1733.33 ml     Laboratory CBC    Component Value Date/Time   WBC 6.7 11/19/2018 0234   HGB 11.7 (L) 11/19/2018 0234   HCT 37.6 (L) 11/19/2018 0234   PLT 204 11/19/2018 0234    BMET    Component Value Date/Time   NA 136 11/19/2018 0234   K 4.8 11/19/2018 0234   CL 103 11/19/2018 0234   CO2 27 11/19/2018 0234   GLUCOSE 118 (H) 11/19/2018 0234   BUN 12 11/19/2018 0234   CREATININE 1.12 11/19/2018 0234   CALCIUM 8.4 (L) 11/19/2018 0234   GFRNONAA >60 11/19/2018 0234   GFRAA >60 11/19/2018 0234    COAG Lab Results  Component Value Date   INR 0.94 11/16/2018   No results found for: PTT  Antibiotics Anti-infectives (From admission, onward)   None       V. Charlena Cross, M.D. [facility] Office: [phone] Pager:  [phone]

## 2018-11-19 NOTE — Progress Notes (Signed)
   Subjective: No new complaints today. Endorses minor dizziness when standing. Received morphine and IV labetalol overnight for pain and SBP 142. Pain is present this morning but very minimal. Continues to ask when he can go home. Discussed plan for repeat imaging today.   Objective:  Vital signs in last 24 hours: Vitals:   11/18/18 1942 11/18/18 2018 11/19/18 0006 11/19/18 0742  BP: (!) 142/73 104/63 95/68 102/60  Pulse: (!) 121  (!) 59 74  Resp: (!) 23 14 14 11   Temp: 99.4 F (37.4 C)  98.8 F (37.1 C) 98.4 F (36.9 C)  TempSrc: Oral  Oral Axillary  SpO2: 95%  92% 92%  Weight:      Height:       Gen: laying in bed, NAD Cardiac: RRR, no m/r/g, bilateral radial and DP pulses are strong and symmetric  Pulm: CTAB  Assessment/Plan:  Principal Problem:   Penetrating atherosclerotic ulcer of aorta (HCC) Active Problems:   Essential hypertension   Smoking   Cocaine abuse (HCC)  63 year old man with newly diagnosed HTN, h/o tobacco and cocaine use p/w with chest pain radiating to the back with associated shortness of breath and was found to have a penetrating aortic ulcer with intraluminal hematoma. We have been consulted for BP management.   Penetrating aortic ulcer with intramural hematoma, HTN control: Goal SBP < 120. Overall well controlled. Received morphine and IV labetalol at the same time overnight for pani and SBP 142. BP is 95/68 this morning. Instructed nursing to not give morphine and prn IV labetalol at the same time as they both can lower BP.  - hold lisinopril, BP is very well controlled - continue po labetolol 100mg  bid - prn IV labetalol for SBP > 130, do not give with morphine  - atorvastatin 20mg  daily  - a1c 4.9  AKI: resolved overnight with IVFs. Bps very well controlled so we will continue holding lisinopril and see how he does. Continue IVFs through today. Eating and drinking well - hold lisinopril  - CTA today per vascular to reevaluate aortic ulcer and  hematoma. Will also take note of any renovascular disease.  - repeat bmp tomorrow  Dispo: Anticipated discharge per vascular surgery   Ali Lowe, MD 11/19/2018, 10:44 AM Pager: [phone]

## 2018-11-20 DIAGNOSIS — I700 Atherosclerosis of aorta: Secondary | ICD-10-CM

## 2018-11-20 LAB — BASIC METABOLIC PANEL
Anion gap: 8 (ref 5–15)
BUN: 9 mg/dL (ref 8–23)
CHLORIDE: 105 mmol/L (ref 98–111)
CO2: 24 mmol/L (ref 22–32)
Calcium: 8.3 mg/dL — ABNORMAL LOW (ref 8.9–10.3)
Creatinine, Ser: 1.06 mg/dL (ref 0.61–1.24)
GFR calc Af Amer: >60 mL/min (ref >60)
GFR calc non Af Amer: >60 mL/min (ref >60)
Glucose, Bld: 135 mg/dL — ABNORMAL HIGH (ref 70–99)
Potassium: 4.5 mmol/L (ref 3.5–5.1)
Sodium: 137 mmol/L (ref 135–145)

## 2018-11-20 NOTE — Progress Notes (Signed)
   Subjective: Feeling well this morning. Asking when he can go home after surgery.   Objective:  Vital signs in last 24 hours: Vitals:   11/19/18 2326 11/20/18 0044 11/20/18 0448 11/20/18 0747  BP: 120/78 122/73 120/74 105/81  Pulse: (!) 59 61 (!) 56   Resp: 13 16 15  (!) 4  Temp: 98.3 F (36.8 C) 98.3 F (36.8 C) 98.5 F (36.9 C) 98.5 F (36.9 C)  TempSrc: Oral Oral Oral Oral  SpO2: 93% 94% 95%   Weight:      Height:       Gen: laying in bed, NAD Cardiac: RRR, no m/r/g, bilateral radial and DP pulses are strong and symmetric   Assessment/Plan:  Principal Problem:   Penetrating atherosclerotic ulcer of aorta (HCC) Active Problems:   Essential hypertension   Smoking   Cocaine abuse (HCC)  63 year old man with newly diagnosed HTN, h/o tobacco and cocaine use p/w with chest pain radiating to the back with associated shortness of breath and was found to have a penetrating aortic ulcer with intraluminal hematoma. We have been consulted for BP management.   Penetrating aortic ulcer with intramural hematoma, HTN control: Goal SBP < 120. Overall well controlled. Has not required any prn IV labetalol in the last 24hrs. Nursing seems to be staying on top of his pain control with prn analgesics which is also helping to control his BP.  - hold lisinopril, BP is very well controlled - continue po labetolol 200mg  bid - prn IV labetalol for SBP > 130, do not give with morphine  - atorvastatin 20mg  daily  - repeat CTA showed progression, vascular planning for surgery next week   AKI: resolved with IVFs. - no evidence of renal vascular narrowing on CTA, if needed, can resume lisinopril but will continue holding given that BP is under control     Dispo: Anticipated discharge per vascular surgery   Ali Lowe, MD 11/20/2018, 1:31 PM Pager: [phone]

## 2018-11-20 NOTE — Progress Notes (Addendum)
[facility]  Subjective  - Pending surgery with no complaints of chest pain this am.   Objective 105/81 (!) 56 98.5 F (36.9 C) (Oral) (!) 4 95%  Intake/Output Summary (Last 24 hours) at 11/20/2018 0813 Last data filed at 11/20/2018 0501 Gross per 24 hour  Intake 2905.01 ml  Output 2700 ml  Net 205.01 ml    Palpable DP pulses B LE Abdomin soft Heart RRR, BP controled  Lungs non labored bresting  Assessment/Planning: Intramural  Hematoma thoracici oorta CTA yesterday shows progression of the PAU. Plan to proceed to surgery this coming week. Continue to control and monitor BP  Mosetta Pigeon 11/20/2018 8:13 AM --  I have interviewed the patient and examined the patient. I agree with the findings by the PA. No chest pain this morning. Given that the penetrating atherosclerotic ulcer of the distal aspect of the aortic arch has progressed some I believe that Dr. Myra Gianotti has plans to proceed with thoracic stent grafting next week.  This would not be on Monday as he is in the office on Mondays.  Cari Caraway, MD [phone]   Laboratory Lab Results: Recent Labs    11/19/18 0234  WBC 6.7  HGB 11.7*  HCT 37.6*  PLT 204   BMET Recent Labs    11/19/18 0234 11/20/18 0322  NA 136 137  K 4.8 4.5  CL 103 105  CO2 27 24  GLUCOSE 118* 135*  BUN 12 9  CREATININE 1.12 1.06  CALCIUM 8.4* 8.3*    COAG Lab Results  Component Value Date   INR 0.94 11/16/2018   No results found for: PTT

## 2018-11-20 NOTE — Progress Notes (Signed)
1947: paged on call MD with pt's BP being 136/74, HR 55, asked if pt can have anything expect labetalol for BP control.  1951: DR, prince called back, asked abt pt's pain, pain was 4/10. MD said check BP after an hour and we will go from there if still high. Will continue to monitor.

## 2018-11-20 NOTE — Progress Notes (Signed)
2054: paged MD with 1 hr BP update, BP 112/81, HR 57.  2057: DR. Criss Alvine called back, stated go ahead and give him 200 mg Labetalol now. Told MD lowest pt's HR I have seen for my shift is 55, MD stated its fine and go ahead and give it now since pt missed AM dose. Will continue to monitor.

## 2018-11-21 DIAGNOSIS — I10 Essential (primary) hypertension: Secondary | ICD-10-CM

## 2018-11-21 DIAGNOSIS — I719 Aortic aneurysm of unspecified site, without rupture: Secondary | ICD-10-CM

## 2018-11-21 DIAGNOSIS — Z720 Tobacco use: Secondary | ICD-10-CM

## 2018-11-21 DIAGNOSIS — F1490 Cocaine use, unspecified, uncomplicated: Secondary | ICD-10-CM

## 2018-11-21 MED ORDER — LABETALOL HCL 5 MG/ML IV SOLN: 10.0000 mg | INTRAVENOUS | Status: DC | PRN

## 2018-11-21 NOTE — Progress Notes (Addendum)
[facility]  Subjective  - No change still having chest and throacic pain.   Objective 121/74 (!) 58 98.6 F (37 C) (Oral) 11 93%  Intake/Output Summary (Last 24 hours) at 11/21/2018 0759 Last data filed at 11/21/2018 0425 Gross per 24 hour  Intake 2998.71 ml  Output 1750 ml  Net 1248.71 ml    Palpable pedal pulses B, moving all 4 ext. Abdomin soft NTTP Haert RRR brady 55 bpm Lungs non labored breathing  Assessment/Planning: Intramural Hematoma thoracici aorta with signs of progression on CTA follow up study.  Plan for surgical intervention likely this week. Continued pain control and BP control with the help of internal medicine doctors. Lovenox for DVT prophylaxis.    Mosetta Pigeon 11/21/2018 7:59 AM --  I have interviewed the patient and examined the patient. I agree with the findings by the PA. Pain-free this morning.  Given the change in the penetrating ulcer on CT scan he is being considered for repair this coming week.  Cari Caraway, MD [phone]   Laboratory Lab Results: Recent Labs    11/19/18 0234  WBC 6.7  HGB 11.7*  HCT 37.6*  PLT 204   BMET Recent Labs    11/19/18 0234 11/20/18 0322  NA 136 137  K 4.8 4.5  CL 103 105  CO2 27 24  GLUCOSE 118* 135*  BUN 12 9  CREATININE 1.12 1.06  CALCIUM 8.4* 8.3*    COAG Lab Results  Component Value Date   INR 0.94 11/16/2018   No results found for: PTT

## 2018-11-21 NOTE — Progress Notes (Signed)
   Subjective: No new complaints. Continues to have mild pain. Encourage him to ask for pain medicine when he needs it. Endorses some dizziness when he firsts stands up.   Objective:  Vital signs in last 24 hours: Vitals:   11/20/18 2348 11/21/18 0151 11/21/18 0425 11/21/18 0859  BP: 123/78 123/75 121/74 111/69  Pulse: 62 60 (!) 58 (!) 59  Resp: 16 12 11 11   Temp: 98.8 F (37.1 C)  98.6 F (37 C)   TempSrc: Oral  Oral   SpO2: 98%  93% 98%  Weight:      Height:       Gen: laying in bed, NAD Cardiac: RRR, no m/r/g, bilateral radial and DP pulses are strong and symmetric   Assessment/Plan:  Principal Problem:   Penetrating atherosclerotic ulcer of aorta (HCC) Active Problems:   Essential hypertension   Smoking   Cocaine abuse (HCC)  63 year old man with newly diagnosed HTN, h/o tobacco and cocaine use p/w with chest pain radiating to the back with associated shortness of breath and was found to have a penetrating aortic ulcer with intraluminal hematoma. We have been consulted for BP management.   Penetrating aortic ulcer with intramural hematoma, HTN control: Goal SBP < 120. Overall well controlled. Has not required any prn IV labetalol in the last 24hrs. Nursing seems to be staying on top of his pain control with prn analgesics which is also helping to control his BP.  - hold lisinopril, BP is very well controlled - continue po labetolol 200mg  bid, added hold parameters if HR < 45 bpm  - prn IV labetalol for SBP > 130, do not give with morphine  - atorvastatin 20mg  daily  - repeat CTA showed progression, vascular planning for surgery next week  - d/c IVFs   Dispo: Anticipated discharge per vascular surgery   Ali Lowe, MD 11/21/2018, 12:20 PM Pager: [phone]

## 2018-11-22 MED ORDER — LISINOPRIL 5 MG PO TABS
5.0000 mg | ORAL_TABLET | Freq: Every day | ORAL | Status: DC
  Administered 2018-11-22 – 2018-11-29 (×8): 5 mg via ORAL
  Filled 2018-11-22 (×8): qty 1

## 2018-11-22 NOTE — Progress Notes (Signed)
   Subjective: Unchanged. Pain got a little worse last night which he attributes to not asking for the pain medicine as frequently. Endorses some dizziness when he first stands.   Objective:  Vital signs in last 24 hours: Vitals:   11/21/18 2148 11/22/18 0005 11/22/18 0302 11/22/18 0846  BP:  116/70 113/71 116/73  Pulse:  (!) 59 85 (!) 53  Resp: 16 15 19    Temp:  98.3 F (36.8 C) 98.8 F (37.1 C) 98.2 F (36.8 C)  TempSrc:  Oral Oral Oral  SpO2:  94% 94% 95%  Weight:      Height:       Gen: laying in bed, NAD Cardiac: RRR, bilateral radial pulses are strong and symmetric   Assessment/Plan:  Principal Problem:   Penetrating atherosclerotic ulcer of aorta (HCC) Active Problems:   Essential hypertension   Smoking   Cocaine abuse (HCC)  63 year old man with newly diagnosed HTN, h/o tobacco and cocaine use p/w with chest pain radiating to the back with associated shortness of breath and was found to have a penetrating aortic ulcer with intraluminal hematoma. We have been consulted for BP management.   Penetrating aortic ulcer with intramural hematoma, HTN control: Goal SBP < 120. Overall well controlled. He has had some SBPs in the 130s. Has not required any prn IV labetalol in the last 24hrs. Encouraged use of analgesics when needed.  - will consider adding low dose lisinopril qhs if he is not going for surgery tomorrow. Administering at night will help offset the side effect of dizziness.  - continue po labetolol 200mg  bid, added hold parameters if HR < 45 bpm  - prn IV labetalol for SBP > 130, do not give with morphine  - atorvastatin 20mg  daily  - repeat CTA showed progression, vascular planning for surgery this week (maybe tomorrow)   Dispo: Anticipated discharge per vascular surgery   Ali Lowe, MD 11/22/2018, 10:04 AM Pager: [phone]

## 2018-11-22 NOTE — Progress Notes (Signed)
CSW provided the patient a list of inpatient and outpatient resources.   CSW singing off.   Drucilla Schmidt, MSW, LCSW-A Clinical Social Worker [employer]

## 2018-11-22 NOTE — Progress Notes (Addendum)
  Progress Note    11/22/2018 1:07 PM  Subjective:  Pain controlled with p.o. pain medication   Vitals:   11/22/18 0302 11/22/18 0846  BP: 113/71 116/73  Pulse: 85 (!) 53  Resp: 19   Temp: 98.8 F (37.1 C) 98.2 F (36.8 C)  SpO2: 94% 95%   Physical Exam: Lungs:  Non labored Extremities:  Palpable DP pulses bilaterally Abdomen:  Soft, nontender Neurologic: A&O  CBC    Component Value Date/Time   WBC 6.7 11/19/2018 0234   RBC 3.69 (L) 11/19/2018 0234   HGB 11.7 (L) 11/19/2018 0234   HCT 37.6 (L) 11/19/2018 0234   PLT 204 11/19/2018 0234   MCV 101.9 (H) 11/19/2018 0234   MCH 31.7 11/19/2018 0234   MCHC 31.1 11/19/2018 0234   RDW 12.2 11/19/2018 0234    BMET    Component Value Date/Time   NA 137 11/20/2018 0322   K 4.5 11/20/2018 0322   CL 105 11/20/2018 0322   CO2 24 11/20/2018 0322   GLUCOSE 135 (H) 11/20/2018 0322   BUN 9 11/20/2018 0322   CREATININE 1.06 11/20/2018 0322   CALCIUM 8.3 (L) 11/20/2018 0322   GFRNONAA >60 11/20/2018 0322   GFRAA >60 11/20/2018 0322    INR    Component Value Date/Time   INR 0.94 11/16/2018 2342     Intake/Output Summary (Last 24 hours) at 11/22/2018 1307 Last data filed at 11/22/2018 0301 Gross per 24 hour  Intake 268 ml  Output 1700 ml  Net -1432 ml     Assessment/Plan:  63 y.o. male  With penetrating aortic ulcer with intramural hematoma  Perfusing extremities well; no sign of other organ system ischemia Pain controlled with p.o. medication HTN recommendations noted from Hospitalist service Dr. Myra Gianotti will discuss treatment plan with patient this afternoon; possible d/c to return at a later date for repair   Emilie Rutter, PA-C [facility] [phone] 11/22/2018 1:07 PM  Extensive discussion regarding surgery with patient and fiance.  I have him scheduled for TEVAR and possible carotid subclavian bypass on Friday.  I went over in detail, the procedure details and risks and benefits.   Tentatively, I will let him go home tomorrow and let him return for surgery on Friday, if medicine agrees.  Durene Cal

## 2018-11-23 DIAGNOSIS — F17200 Nicotine dependence, unspecified, uncomplicated: Secondary | ICD-10-CM

## 2018-11-23 DIAGNOSIS — F1410 Cocaine abuse, uncomplicated: Secondary | ICD-10-CM

## 2018-11-23 LAB — CREATININE, SERUM
CREATININE: 0.95 mg/dL (ref 0.61–1.24)
GFR calc Af Amer: >60 mL/min (ref >60)
GFR calc non Af Amer: >60 mL/min (ref >60)

## 2018-11-23 MED ORDER — OXYCODONE-ACETAMINOPHEN 7.5-325 MG PO TABS
1.0000 | ORAL_TABLET | ORAL | Status: DC | PRN
  Administered 2018-11-23 – 2018-11-30 (×33): 1 via ORAL
  Filled 2018-11-23 (×32): qty 1

## 2018-11-23 MED ORDER — LABETALOL HCL 200 MG PO TABS
200.0000 mg | ORAL_TABLET | Freq: Three times a day (TID) | ORAL | Status: DC
  Administered 2018-11-23 – 2018-11-25 (×6): 200 mg via ORAL
  Filled 2018-11-23 (×6): qty 1

## 2018-11-23 MED ORDER — OXYCODONE-ACETAMINOPHEN 7.5-325 MG PO TABS
1.0000 | ORAL_TABLET | ORAL | Status: DC | PRN
  Filled 2018-11-23: qty 1

## 2018-11-23 MED ORDER — SENNOSIDES-DOCUSATE SODIUM 8.6-50 MG PO TABS: 1.0000 | ORAL_TABLET | Freq: Two times a day (BID) | ORAL | Status: DC

## 2018-11-23 MED ORDER — OXYCODONE-ACETAMINOPHEN 7.5-325 MG PO TABS: 1.0000 | ORAL_TABLET | ORAL | Status: DC | PRN

## 2018-11-23 MED ORDER — POLYETHYLENE GLYCOL 3350 17 G PO PACK: 17.0000 g | PACK | Freq: Every day | ORAL | Status: DC

## 2018-11-23 NOTE — Progress Notes (Signed)
   Subjective:  He would like to go home but is still needing IV morphine.  Continues to have issues with pain control in the evening which in turn elevates his BP. He thinks that he's waiting too long between pain medications in the afternoon and also feels that 2 10mg  tablets of percocet would be better than 2 5mg  tablets. I will let vascular manage his pain medications. He is still having regular bowel movements despite his narcotic pain medications. Discussed plan to increase frequency of blood pressure medicine to hopefully give him more coverage during the evening hours when his blood pressure seems to trend upward.   Objective:  Vital signs in last 24 hours: Vitals:   11/22/18 2347 11/23/18 0415 11/23/18 0639 11/23/18 0901  BP: 120/84 109/77 125/87 111/81  Pulse: 60 (!) 52  (!) 55  Resp: 19 11 12    Temp: 98.8 F (37.1 C) 98.3 F (36.8 C)    TempSrc: Oral Oral    SpO2: 100% 90%    Weight:      Height:       Gen: laying in bed, NAD Cardiac: RRR, bilateral radial pulses are strong and symmetric   Assessment/Plan:  Principal Problem:   Penetrating atherosclerotic ulcer of aorta (HCC) Active Problems:   Essential hypertension   Smoking   Cocaine abuse (HCC)  63 year old man with newly diagnosed HTN, h/o tobacco and cocaine use p/w with chest pain radiating to the back with associated shortness of breath and was found to have a penetrating aortic ulcer with intraluminal hematoma. We have been consulted for BP management.   Penetrating aortic ulcer with intramural hematoma, HTN control: Goal SBP < 120. Elevated in the evening between 8pm and 12am when his pain got worse. Has not required any prn IV labetalol in the last 24hrs but has needed IV morphine. Will need to get him on a stable po pain and BP regiment prior to discharge.  - continue lisinopril 5mg  qhs - increase frequency of po labetolol 200mg  from BID to TID in order to provide BP coverage in the evening hours when his BP  trends upward. Hold labetalol if HR < 45 bpm  - prn IV labetalol for SBP > 130, do not give with morphine  - atorvastatin 20mg  daily  - for pain control, he had been receiving percocet 5mg  2 tablets q4h prn and the order has now changed to 7.5mg  1 tablet q4h prn. Consider increasing this back to 5mg  2 tablets q4h prn   Dispo:  Could potentially discharge later today if pain and BP are well controlled on po medications. Reiterated the importance of tobacco and cocaine cessation after discharge.   Ali Lowe, MD 11/23/2018, 11:13 AM Pager: [phone]

## 2018-11-23 NOTE — Progress Notes (Addendum)
  Progress Note    11/23/2018 7:55 AM  Subjective:  Hypertensive episodes related to pain.  Patient still requiring IV pain medication.   Vitals:   11/23/18 0415 11/23/18 0639  BP: 109/77 125/87  Pulse: (!) 52   Resp: 11 12  Temp: 98.3 F (36.8 C)   SpO2: 90%    Physical Exam: Lungs:  Non labored Extremities:  Symmetrical radial and DP pulses Abdomen:  Soft, non tender Neurologic: A&O  CBC    Component Value Date/Time   WBC 6.7 11/19/2018 0234   RBC 3.69 (L) 11/19/2018 0234   HGB 11.7 (L) 11/19/2018 0234   HCT 37.6 (L) 11/19/2018 0234   PLT 204 11/19/2018 0234   MCV 101.9 (H) 11/19/2018 0234   MCH 31.7 11/19/2018 0234   MCHC 31.1 11/19/2018 0234   RDW 12.2 11/19/2018 0234    BMET    Component Value Date/Time   NA 137 11/20/2018 0322   K 4.5 11/20/2018 0322   CL 105 11/20/2018 0322   CO2 24 11/20/2018 0322   GLUCOSE 135 (H) 11/20/2018 0322   BUN 9 11/20/2018 0322   CREATININE 0.95 11/23/2018 0527   CALCIUM 8.3 (L) 11/20/2018 0322   GFRNONAA >60 11/23/2018 0527   GFRAA >60 11/23/2018 0527    INR    Component Value Date/Time   INR 0.94 11/16/2018 2342     Intake/Output Summary (Last 24 hours) at 11/23/2018 0755 Last data filed at 11/23/2018 0416 Gross per 24 hour  Intake -  Output 1000 ml  Net -1000 ml     Assessment/Plan:  63 y.o. male with penetrating aortic ulcer with intramural hematoma   Perfusing extremities well Still requiring IV morphine to control pain; pain associated with HTN episodes HTN regimen per Hospitalist team Will d/c home if able to wean from IV morphine   Emilie Rutter, PA-C [facility] [phone] 11/23/2018 7:55 AM

## 2018-11-24 MED ORDER — MORPHINE SULFATE (PF) 2 MG/ML IV SOLN: 2.0000 mg | INTRAVENOUS | Status: DC | PRN

## 2018-11-24 NOTE — Progress Notes (Addendum)
  Progress Note    11/24/2018 7:58 AM  Subjective: pain controlled with percocet.  Chest discomfort tolerable.   Vitals:   11/24/18 0440 11/24/18 0508  BP: 136/83 119/85  Pulse: (!) 58   Resp: 14 10  Temp: 98.4 F (36.9 C)   SpO2: 95%    Physical Exam: Lungs:  Non labored Extremities: palpable DP pulses Abdomen: soft, non-tender Neurologic: A&O  CBC    Component Value Date/Time   WBC 6.7 11/19/2018 0234   RBC 3.69 (L) 11/19/2018 0234   HGB 11.7 (L) 11/19/2018 0234   HCT 37.6 (L) 11/19/2018 0234   PLT 204 11/19/2018 0234   MCV 101.9 (H) 11/19/2018 0234   MCH 31.7 11/19/2018 0234   MCHC 31.1 11/19/2018 0234   RDW 12.2 11/19/2018 0234    BMET    Component Value Date/Time   NA 137 11/20/2018 0322   K 4.5 11/20/2018 0322   CL 105 11/20/2018 0322   CO2 24 11/20/2018 0322   GLUCOSE 135 (H) 11/20/2018 0322   BUN 9 11/20/2018 0322   CREATININE 0.95 11/23/2018 0527   CALCIUM 8.3 (L) 11/20/2018 0322   GFRNONAA >60 11/23/2018 0527   GFRAA >60 11/23/2018 0527    INR    Component Value Date/Time   INR 0.94 11/16/2018 2342     Intake/Output Summary (Last 24 hours) at 11/24/2018 0758 Last data filed at 11/24/2018 0500 Gross per 24 hour  Intake 1440 ml  Output 1451 ml  Net -11 ml     Assessment/Plan:  63 y.o. male with aortic ulcer  -BP well controlled with current regimen -Pain now managed with percocet; morphine weaned yesterday -After discussion, despite better BP and pain control, family would prefer patient remain in house until surgery Friday   Emilie Rutter, PA-C [facility] [phone] 11/24/2018 7:58 AM

## 2018-11-24 NOTE — Progress Notes (Addendum)
   Subjective: Pain and BP well controlled on po regiment. However, patient feels it would be safer to stay in the hospital until surgery due to unstable home environment that will likely increase his stress levels and subsequently make it more difficult to control his BP at home.   Objective:  Vital signs in last 24 hours: Vitals:   11/24/18 0025 11/24/18 0440 11/24/18 0508 11/24/18 0813  BP:  136/83 119/85 120/72  Pulse:  (!) 58  (!) 58  Resp: 18 14 10 15   Temp:  98.4 F (36.9 C)  98.2 F (36.8 C)  TempSrc:  Oral  Oral  SpO2:  95%  90%  Weight:      Height:       Gen: laying in bed, NAD Cardiac: RRR, bilateral radial and DP pulses are strong and symmetric   Assessment/Plan:  Principal Problem:   Penetrating atherosclerotic ulcer of aorta (HCC) Active Problems:   Essential hypertension   Smoking   Cocaine abuse (HCC)  63 year old man with newly diagnosed HTN, h/o tobacco and cocaine use p/w with chest pain radiating to the back with associated shortness of breath and was found to have a penetrating aortic ulcer with intramural hematoma. We have been consulted for BP management.   Penetrating aortic ulcer with intramural hematoma, HTN control: Goal SBP < 120. Better controlled with increased frequency of labetalol. Had some increased SBP in the 130s early this morning due to pain when he woke up. BP improved after pain medicine was given. No IV labetalol needed. Dizziness upon standing has been unchanged since increasing frequency of po labetalol.  - lisinopril 5mg  qhs - po labetolol 200mg  TID, hold labetalol if HR < 45 bpm  - prn IV labetalol for SBP > 130, do not give with morphine  - atorvastatin 20mg  daily    Dispo:  Safe for discharge today but understand his reasoning for wanting to stay in house until the surgery. Since his BP has been well controlled on the current regiment, we will now follow remotely. Please do not hesitate to reach out with any questions or  concerns.   Ali Lowe, MD 11/24/2018, 10:34 AM Pager: [phone]    Internal Medicine Attending Attestation:   I have seen and evaluated this patient and I have discussed the plan of care with the house staff. Please see their note for complete details. I concur with their findings with the following additions/corrections:   As noted above, BP has been relatively well controlled, SBP briefly above 130 this morning with pain but improved after pain medications and morning BP meds.  He is planning to stay until surgery on Friday out of concern for overexerting himself or getting too worked up and having a spike in his pain or blood pressure.  As he seems to be doing well on his current regimen, we will follow remotely for now and will be glad to help address any further questions that arise.  Would recommend holding lisinopril the night before surgery.  Anne Shutter, MD 11/24/2018, 11:45 AM

## 2018-11-25 DIAGNOSIS — I700 Atherosclerosis of aorta: Secondary | ICD-10-CM

## 2018-11-25 MED ORDER — CEFAZOLIN SODIUM-DEXTROSE 2-4 GM/100ML-% IV SOLN
2.0000 g | INTRAVENOUS | Status: AC
  Filled 2018-11-25 (×2): qty 100

## 2018-11-25 MED ORDER — LABETALOL HCL 200 MG PO TABS
200.0000 mg | ORAL_TABLET | Freq: Three times a day (TID) | ORAL | Status: DC
  Administered 2018-11-25 – 2018-11-27 (×7): 200 mg via ORAL
  Filled 2018-11-25 (×7): qty 1

## 2018-11-25 NOTE — H&P (View-Only) (Signed)
  Progress Note    11/25/2018 7:15 AM  Subjective:  No complaints this morning   Vitals:   11/24/18 2325 11/25/18 0323  BP:  125/89  Pulse:    Resp: 12 13  Temp:  98.9 F (37.2 C)  SpO2:  96%   Physical Exam: Lungs:  Non labored Extremities:  Symmetrical radial and DP pulses Abdomen:  Soft, NT, ND Neurologic: A&O  CBC    Component Value Date/Time   WBC 6.7 11/19/2018 0234   RBC 3.69 (L) 11/19/2018 0234   HGB 11.7 (L) 11/19/2018 0234   HCT 37.6 (L) 11/19/2018 0234   PLT 204 11/19/2018 0234   MCV 101.9 (H) 11/19/2018 0234   MCH 31.7 11/19/2018 0234   MCHC 31.1 11/19/2018 0234   RDW 12.2 11/19/2018 0234    BMET    Component Value Date/Time   NA 137 11/20/2018 0322   K 4.5 11/20/2018 0322   CL 105 11/20/2018 0322   CO2 24 11/20/2018 0322   GLUCOSE 135 (H) 11/20/2018 0322   BUN 9 11/20/2018 0322   CREATININE 0.95 11/23/2018 0527   CALCIUM 8.3 (L) 11/20/2018 0322   GFRNONAA >60 11/23/2018 0527   GFRAA >60 11/23/2018 0527    INR    Component Value Date/Time   INR 0.94 11/16/2018 2342     Intake/Output Summary (Last 24 hours) at 11/25/2018 0715 Last data filed at 11/25/2018 0323 Gross per 24 hour  Intake 720 ml  Output 125 ml  Net 595 ml     Assessment/Plan:  63 y.o. male with thoracic aortic ulcer  Continue current pain regimen Continue current regimen for HTN Plan is for TEVAR and possible carotid to subclavian bypass tomorrow by Dr. Masud Holub NPO past midnight All questions were answered; risks and benefits discussed; consent to be obtained by nursing staff   Matthew Eveland, PA-C [facility] [phone] 11/25/2018 7:15 AM  I agree with the above.  I have seen and evaluated the patient.  We again went over the procedure details for tomorrow.  All of his questions were answered.  Wells Shatira Dobosz   

## 2018-11-25 NOTE — Progress Notes (Addendum)
  Progress Note    11/25/2018 7:15 AM  Subjective:  No complaints this morning   Vitals:   11/24/18 2325 11/25/18 0323  BP:  125/89  Pulse:    Resp: 12 13  Temp:  98.9 F (37.2 C)  SpO2:  96%   Physical Exam: Lungs:  Non labored Extremities:  Symmetrical radial and DP pulses Abdomen:  Soft, NT, ND Neurologic: A&O  CBC    Component Value Date/Time   WBC 6.7 11/19/2018 0234   RBC 3.69 (L) 11/19/2018 0234   HGB 11.7 (L) 11/19/2018 0234   HCT 37.6 (L) 11/19/2018 0234   PLT 204 11/19/2018 0234   MCV 101.9 (H) 11/19/2018 0234   MCH 31.7 11/19/2018 0234   MCHC 31.1 11/19/2018 0234   RDW 12.2 11/19/2018 0234    BMET    Component Value Date/Time   NA 137 11/20/2018 0322   K 4.5 11/20/2018 0322   CL 105 11/20/2018 0322   CO2 24 11/20/2018 0322   GLUCOSE 135 (H) 11/20/2018 0322   BUN 9 11/20/2018 0322   CREATININE 0.95 11/23/2018 0527   CALCIUM 8.3 (L) 11/20/2018 0322   GFRNONAA >60 11/23/2018 0527   GFRAA >60 11/23/2018 0527    INR    Component Value Date/Time   INR 0.94 11/16/2018 2342     Intake/Output Summary (Last 24 hours) at 11/25/2018 0715 Last data filed at 11/25/2018 0323 Gross per 24 hour  Intake 720 ml  Output 125 ml  Net 595 ml     Assessment/Plan:  63 y.o. male with thoracic aortic ulcer  Continue current pain regimen Continue current regimen for HTN Plan is for TEVAR and possible carotid to subclavian bypass tomorrow by Dr. Myra Gianotti NPO past midnight All questions were answered; risks and benefits discussed; consent to be obtained by nursing staff   Emilie Rutter, PA-C [facility] [phone] 11/25/2018 7:15 AM  I agree with the above.  I have seen and evaluated the patient.  We again went over the procedure details for tomorrow.  All of his questions were answered.  Durene Cal

## 2018-11-26 ENCOUNTER — Encounter (HOSPITAL_COMMUNITY): Payer: Self-pay | Admitting: Certified Registered Nurse Anesthetist

## 2018-11-26 ENCOUNTER — Inpatient Hospital Stay (HOSPITAL_COMMUNITY): Payer: Medicaid Other

## 2018-11-26 ENCOUNTER — Encounter (HOSPITAL_COMMUNITY)
Admission: EM | Disposition: A | Discharge status: Home / Self Care | Payer: Self-pay | Source: Home / Self Care | Attending: Vascular Surgery

## 2018-11-26 HISTORY — PX: THORACIC AORTIC ENDOVASCULAR STENT GRAFT: SHX6112

## 2018-11-26 HISTORY — PX: CAROTID-SUBCLAVIAN BYPASS GRAFT: SHX910

## 2018-11-26 HISTORY — PX: ULTRASOUND GUIDANCE FOR VASCULAR ACCESS: SHX6516

## 2018-11-26 LAB — SURGICAL PCR SCREEN
MRSA, PCR: NEGATIVE
Staphylococcus aureus: NEGATIVE

## 2018-11-26 LAB — POCT ACTIVATED CLOTTING TIME
Activated Clotting Time: 202 seconds
Activated Clotting Time: 208 seconds
Activated Clotting Time: 219 seconds

## 2018-11-26 LAB — CBC
HCT: 36.8 % — ABNORMAL LOW (ref 39.0–52.0)
HEMOGLOBIN: 11.9 g/dL — AB (ref 13.0–17.0)
MCH: 31.4 pg (ref 26.0–34.0)
MCHC: 32.3 g/dL (ref 30.0–36.0)
MCV: 97.1 fL (ref 80.0–100.0)
Platelets: 344 10*3/uL (ref 150–400)
RBC: 3.79 MIL/uL — ABNORMAL LOW (ref 4.22–5.81)
RDW: 11.7 % (ref 11.5–15.5)
WBC: 9.4 10*3/uL (ref 4.0–10.5)
nRBC: 0 % (ref 0.0–0.2)

## 2018-11-26 LAB — ABO/RH: ABO/RH(D): O NEG

## 2018-11-26 LAB — TYPE AND SCREEN
ABO/RH(D): O NEG
Antibody Screen: NEGATIVE

## 2018-11-26 SURGERY — CREATION, BYPASS, ARTERIAL, SUBCLAVIAN TO CAROTID, USING GRAFT
Anesthesia: General | Site: Neck

## 2018-11-26 MED ORDER — ROCURONIUM BROMIDE 50 MG/5ML IV SOSY
PREFILLED_SYRINGE | INTRAVENOUS | Status: AC
  Filled 2018-11-26: qty 10

## 2018-11-26 MED ORDER — LACTATED RINGERS IV SOLN
INTRAVENOUS | Status: DC | PRN
  Administered 2018-11-26: 12:00:00 via INTRAVENOUS

## 2018-11-26 MED ORDER — SUCCINYLCHOLINE CHLORIDE 200 MG/10ML IV SOSY
PREFILLED_SYRINGE | INTRAVENOUS | Status: AC
  Filled 2018-11-26: qty 10

## 2018-11-26 MED ORDER — PROPOFOL 10 MG/ML IV BOLUS
INTRAVENOUS | Status: AC
  Filled 2018-11-26: qty 20

## 2018-11-26 MED ORDER — LIDOCAINE HCL (PF) 1 % IJ SOLN
INTRAMUSCULAR | Status: AC
  Filled 2018-11-26: qty 30

## 2018-11-26 MED ORDER — ROCURONIUM BROMIDE 50 MG/5ML IV SOSY
PREFILLED_SYRINGE | INTRAVENOUS | Status: AC
  Filled 2018-11-26: qty 25

## 2018-11-26 MED ORDER — MIDAZOLAM HCL 2 MG/2ML IJ SOLN
INTRAMUSCULAR | Status: DC | PRN
  Administered 2018-11-26: 1 mg via INTRAVENOUS

## 2018-11-26 MED ORDER — SODIUM CHLORIDE 0.9 % IV SOLN
INTRAVENOUS | Status: DC | PRN
  Administered 2018-11-26: 15 ug/min via INTRAVENOUS

## 2018-11-26 MED ORDER — MIDAZOLAM HCL 2 MG/2ML IJ SOLN
INTRAMUSCULAR | Status: AC
  Filled 2018-11-26: qty 2

## 2018-11-26 MED ORDER — ONDANSETRON HCL 4 MG/2ML IJ SOLN: 4.0000 mg | Freq: Once | INTRAMUSCULAR | Status: DC | PRN

## 2018-11-26 MED ORDER — DEXAMETHASONE SODIUM PHOSPHATE 10 MG/ML IJ SOLN
INTRAMUSCULAR | Status: AC
  Filled 2018-11-26: qty 2

## 2018-11-26 MED ORDER — PHENYLEPHRINE 40 MCG/ML (10ML) SYRINGE FOR IV PUSH (FOR BLOOD PRESSURE SUPPORT)
PREFILLED_SYRINGE | INTRAVENOUS | Status: DC | PRN
  Administered 2018-11-26: 120 ug via INTRAVENOUS

## 2018-11-26 MED ORDER — 0.9 % SODIUM CHLORIDE (POUR BTL) OPTIME
TOPICAL | Status: DC | PRN
  Administered 2018-11-26: 2000 mL

## 2018-11-26 MED ORDER — CEFAZOLIN SODIUM 1 G IJ SOLR
INTRAMUSCULAR | Status: AC
  Filled 2018-11-26: qty 20

## 2018-11-26 MED ORDER — HEPARIN SODIUM (PORCINE) 1000 UNIT/ML IJ SOLN
INTRAMUSCULAR | Status: AC
  Filled 2018-11-26: qty 1

## 2018-11-26 MED ORDER — LABETALOL HCL 5 MG/ML IV SOLN
INTRAVENOUS | Status: AC
  Filled 2018-11-26: qty 4

## 2018-11-26 MED ORDER — ONDANSETRON HCL 4 MG/2ML IJ SOLN
INTRAMUSCULAR | Status: DC | PRN
  Administered 2018-11-26: 4 mg via INTRAVENOUS

## 2018-11-26 MED ORDER — SUGAMMADEX SODIUM 200 MG/2ML IV SOLN
INTRAVENOUS | Status: DC | PRN
  Administered 2018-11-26: 200 mg via INTRAVENOUS

## 2018-11-26 MED ORDER — HEMOSTATIC AGENTS (NO CHARGE) OPTIME
TOPICAL | Status: DC | PRN
  Administered 2018-11-26: 1 via TOPICAL

## 2018-11-26 MED ORDER — SODIUM CHLORIDE 0.9 % IV SOLN
INTRAVENOUS | Status: AC
  Filled 2018-11-26: qty 1.2

## 2018-11-26 MED ORDER — FENTANYL CITRATE (PF) 100 MCG/2ML IJ SOLN
INTRAMUSCULAR | Status: AC
  Filled 2018-11-26: qty 2

## 2018-11-26 MED ORDER — HEPARIN SODIUM (PORCINE) 1000 UNIT/ML IJ SOLN
INTRAMUSCULAR | Status: AC
  Filled 2018-11-26: qty 2

## 2018-11-26 MED ORDER — GLYCOPYRROLATE 0.2 MG/ML IJ SOLN
INTRAMUSCULAR | Status: DC | PRN
  Administered 2018-11-26: 0.1 mg via INTRAVENOUS

## 2018-11-26 MED ORDER — FENTANYL CITRATE (PF) 250 MCG/5ML IJ SOLN
INTRAMUSCULAR | Status: AC
  Filled 2018-11-26: qty 5

## 2018-11-26 MED ORDER — IODIXANOL 320 MG/ML IV SOLN
INTRAVENOUS | Status: DC | PRN
  Administered 2018-11-26: 150 mL via INTRAVENOUS

## 2018-11-26 MED ORDER — MIDAZOLAM HCL 2 MG/2ML IJ SOLN
2.0000 mg | Freq: Once | INTRAMUSCULAR | Status: AC
  Administered 2018-11-26: 2 mg via INTRAVENOUS

## 2018-11-26 MED ORDER — CEFAZOLIN SODIUM-DEXTROSE 2-3 GM-%(50ML) IV SOLR
INTRAVENOUS | Status: DC | PRN
  Administered 2018-11-26: 2 g via INTRAVENOUS

## 2018-11-26 MED ORDER — FENTANYL CITRATE (PF) 250 MCG/5ML IJ SOLN
INTRAMUSCULAR | Status: DC | PRN
  Administered 2018-11-26 (×5): 50 ug via INTRAVENOUS

## 2018-11-26 MED ORDER — LABETALOL HCL 5 MG/ML IV SOLN
2.5000 mg | INTRAVENOUS | Status: AC | PRN
  Administered 2018-11-26 (×2): 2.5 mg via INTRAVENOUS

## 2018-11-26 MED ORDER — ONDANSETRON HCL 4 MG/2ML IJ SOLN
INTRAMUSCULAR | Status: AC
  Filled 2018-11-26: qty 2

## 2018-11-26 MED ORDER — SODIUM CHLORIDE 0.9 % IV SOLN
INTRAVENOUS | Status: DC | PRN
  Administered 2018-11-26 (×2)

## 2018-11-26 MED ORDER — ROCURONIUM BROMIDE 10 MG/ML (PF) SYRINGE
PREFILLED_SYRINGE | INTRAVENOUS | Status: DC | PRN
  Administered 2018-11-26: 20 mg via INTRAVENOUS
  Administered 2018-11-26: 10 mg via INTRAVENOUS
  Administered 2018-11-26: 20 mg via INTRAVENOUS
  Administered 2018-11-26: 80 mg via INTRAVENOUS
  Administered 2018-11-26: 10 mg via INTRAVENOUS

## 2018-11-26 MED ORDER — DEXAMETHASONE SODIUM PHOSPHATE 10 MG/ML IJ SOLN
INTRAMUSCULAR | Status: DC | PRN
  Administered 2018-11-26: 5 mg via INTRAVENOUS

## 2018-11-26 MED ORDER — PROTAMINE SULFATE 10 MG/ML IV SOLN
INTRAVENOUS | Status: AC
  Filled 2018-11-26: qty 5

## 2018-11-26 MED ORDER — LACTATED RINGERS IV SOLN: INTRAVENOUS | Status: DC | PRN

## 2018-11-26 MED ORDER — FENTANYL CITRATE (PF) 100 MCG/2ML IJ SOLN
25.0000 ug | INTRAMUSCULAR | Status: DC | PRN
  Administered 2018-11-26 (×2): 50 ug via INTRAVENOUS

## 2018-11-26 MED ORDER — HEPARIN SODIUM (PORCINE) 1000 UNIT/ML IJ SOLN
INTRAMUSCULAR | Status: DC | PRN
  Administered 2018-11-26: 3000 [IU] via INTRAVENOUS
  Administered 2018-11-26: 9000 [IU] via INTRAVENOUS
  Administered 2018-11-26: 2000 [IU] via INTRAVENOUS

## 2018-11-26 MED ORDER — PROTAMINE SULFATE 10 MG/ML IV SOLN
INTRAVENOUS | Status: DC | PRN
  Administered 2018-11-26: 50 mg via INTRAVENOUS

## 2018-11-26 MED ORDER — MIDAZOLAM HCL 2 MG/2ML IJ SOLN
INTRAMUSCULAR | Status: AC
  Administered 2018-11-26: 2 mg via INTRAVENOUS
  Filled 2018-11-26: qty 2

## 2018-11-26 MED ORDER — EPHEDRINE SULFATE-NACL 50-0.9 MG/10ML-% IV SOSY
PREFILLED_SYRINGE | INTRAVENOUS | Status: DC | PRN
  Administered 2018-11-26: 5 mg via INTRAVENOUS

## 2018-11-26 MED ORDER — PROPOFOL 10 MG/ML IV BOLUS
INTRAVENOUS | Status: DC | PRN
  Administered 2018-11-26: 140 mg via INTRAVENOUS

## 2018-11-26 SURGICAL SUPPLY — 94 items
ADH SKN CLS APL DERMABOND .7 (GAUZE/BANDAGES/DRESSINGS) ×6
BAG BANDED W/RUBBER/TAPE 36X54 (MISCELLANEOUS) ×2 IMPLANT
BAG DECANTER FOR FLEXI CONT (MISCELLANEOUS) ×5 IMPLANT
BAG EQP BAND 135X91 W/RBR TAPE (MISCELLANEOUS) ×3
BAG SNAP BAND KOVER 36X36 (MISCELLANEOUS) ×2 IMPLANT
CANISTER SUCT 3000ML PPV (MISCELLANEOUS) ×5 IMPLANT
CATH ACCU-VU SIZ PIG 5F 100CM (CATHETERS) ×2 IMPLANT
CATH ROBINSON RED A/P 18FR (CATHETERS) ×5 IMPLANT
CATH SUCT 10FR WHISTLE TIP (CATHETERS) IMPLANT
CATH VISIONS PV .035 IVUS (CATHETERS) ×2 IMPLANT
CLIP VESOCCLUDE MED 24/CT (CLIP) ×5 IMPLANT
CLIP VESOCCLUDE SM WIDE 24/CT (CLIP) ×5 IMPLANT
COVER BACK TABLE 80X110 HD (DRAPES) ×2 IMPLANT
COVER DOME SNAP 22 D (MISCELLANEOUS) ×2 IMPLANT
COVER MAYO STAND STRL (DRAPES) ×2 IMPLANT
COVER PROBE W GEL 5X96 (DRAPES) ×7 IMPLANT
COVER WAND RF STERILE (DRAPES) ×5 IMPLANT
CRADLE DONUT ADULT HEAD (MISCELLANEOUS) ×5 IMPLANT
DERMABOND ADVANCED (GAUZE/BANDAGES/DRESSINGS) ×4
DERMABOND ADVANCED .7 DNX12 (GAUZE/BANDAGES/DRESSINGS) ×3 IMPLANT
DEVICE CLOSURE PERCLS PRGLD 6F (VASCULAR PRODUCTS) IMPLANT
DRAIN CHANNEL 15F RND FF W/TCR (WOUND CARE) IMPLANT
DRAPE INCISE IOBAN 66X45 STRL (DRAPES) ×2 IMPLANT
DRAPE ZERO GRAVITY STERILE (DRAPES) ×5 IMPLANT
DRSG TEGADERM 2-3/8X2-3/4 SM (GAUZE/BANDAGES/DRESSINGS) ×5 IMPLANT
DRYSEAL FLEXSHEATH 22FR 33CM (SHEATH) ×2
ELECT CAUTERY BLADE 6.4 (BLADE) ×5 IMPLANT
ELECT REM PT RETURN 9FT ADLT (ELECTROSURGICAL) ×10
ELECTRODE REM PT RTRN 9FT ADLT (ELECTROSURGICAL) ×6 IMPLANT
EVACUATOR SILICONE 100CC (DRAIN) IMPLANT
FELT TEFLON 1X6 (MISCELLANEOUS) ×2 IMPLANT
GAUZE SPONGE 4X4 12PLY STRL (GAUZE/BANDAGES/DRESSINGS) ×5 IMPLANT
GLOVE BIO SURGEON STRL SZ7.5 (GLOVE) ×4 IMPLANT
GLOVE BIOGEL M SZ8.5 STRL (GLOVE) ×4 IMPLANT
GLOVE BIOGEL PI IND STRL 6 (GLOVE) IMPLANT
GLOVE BIOGEL PI IND STRL 7.5 (GLOVE) ×3 IMPLANT
GLOVE BIOGEL PI IND STRL 8 (GLOVE) IMPLANT
GLOVE BIOGEL PI INDICATOR 6 (GLOVE) ×4
GLOVE BIOGEL PI INDICATOR 7.5 (GLOVE) ×6
GLOVE BIOGEL PI INDICATOR 8 (GLOVE) ×4
GLOVE SURG SS PI 7.5 STRL IVOR (GLOVE) ×7 IMPLANT
GOWN STRL REUS W/ TWL LRG LVL3 (GOWN DISPOSABLE) ×6 IMPLANT
GOWN STRL REUS W/ TWL XL LVL3 (GOWN DISPOSABLE) ×3 IMPLANT
GOWN STRL REUS W/TWL LRG LVL3 (GOWN DISPOSABLE) ×20
GOWN STRL REUS W/TWL XL LVL3 (GOWN DISPOSABLE) ×15
GRAFT BALLN CATH 65CM (STENTS) IMPLANT
HEMOSTAT SNOW SURGICEL 2X4 (HEMOSTASIS) ×2 IMPLANT
KIT BASIN OR (CUSTOM PROCEDURE TRAY) ×5 IMPLANT
KIT HEART LEFT (KITS) ×2 IMPLANT
KIT TURNOVER KIT B (KITS) ×5 IMPLANT
NDL PERC 18GX7CM (NEEDLE) ×3 IMPLANT
NEEDLE PERC 18GX7CM (NEEDLE) ×5 IMPLANT
NS IRRIG 1000ML POUR BTL (IV SOLUTION) ×10 IMPLANT
PACK CAROTID (CUSTOM PROCEDURE TRAY) ×5 IMPLANT
PACK ENDOVASCULAR (PACKS) ×5 IMPLANT
PAD ARMBOARD 7.5X6 YLW CONV (MISCELLANEOUS) ×10 IMPLANT
PENCIL BUTTON HOLSTER BLD 10FT (ELECTRODE) ×5 IMPLANT
PERCLOSE PROGLIDE 6F (VASCULAR PRODUCTS) ×5
PUNCH AORTIC ROTATE 5MM 8IN (MISCELLANEOUS) ×2 IMPLANT
SET MICROPUNCTURE 5F STIFF (MISCELLANEOUS) ×2 IMPLANT
SHEATH AVANTI 11CM 8FR (SHEATH) IMPLANT
SHEATH DRYSEAL FLEX 22FR 33CM (SHEATH) IMPLANT
SHEATH PINNACLE 8F 10CM (SHEATH) ×2 IMPLANT
SHIELD RADPAD SCOOP 12X17 (MISCELLANEOUS) ×10 IMPLANT
SHUNT CAROTID BYPASS 10 (VASCULAR PRODUCTS) IMPLANT
SHUNT CAROTID BYPASS 12 (VASCULAR PRODUCTS) IMPLANT
SLEEVE ISOL F/PACE RF HD COVER (MISCELLANEOUS) ×2 IMPLANT
STENT GRAFT BALLN CATH 65CM (STENTS)
STENT GRFT THORAC ACS 37X37X15 (Endovascular Graft) ×2 IMPLANT
STOPCOCK MORSE 400PSI 3WAY (MISCELLANEOUS) ×5 IMPLANT
SUT ETHILON 3 0 PS 1 (SUTURE) IMPLANT
SUT PROLENE 5 0 C 1 24 (SUTURE) ×6 IMPLANT
SUT PROLENE 6 0 BV (SUTURE) ×5 IMPLANT
SUT PROLENE 7 0 BV 1 (SUTURE) IMPLANT
SUT SILK 2 0 (SUTURE) ×5
SUT SILK 2-0 18XBRD TIE 12 (SUTURE) IMPLANT
SUT SILK 3 0 (SUTURE) ×5
SUT SILK 3-0 18XBRD TIE 12 (SUTURE) IMPLANT
SUT SILK 4 0 (SUTURE) ×5
SUT SILK 4-0 18XBRD TIE 12 (SUTURE) IMPLANT
SUT VIC AB 2-0 CT1 27 (SUTURE)
SUT VIC AB 2-0 CT1 TAPERPNT 27 (SUTURE) IMPLANT
SUT VIC AB 3-0 SH 27 (SUTURE) ×10
SUT VIC AB 3-0 SH 27X BRD (SUTURE) ×3 IMPLANT
SUT VICRYL 4-0 PS2 18IN ABS (SUTURE) IMPLANT
SYR 30ML LL (SYRINGE) ×2 IMPLANT
SYR 50ML LL SCALE MARK (SYRINGE) ×5 IMPLANT
SYR MEDRAD MARK V 150ML (SYRINGE) ×2 IMPLANT
TOWEL GREEN STERILE (TOWEL DISPOSABLE) ×5 IMPLANT
TRAY FOLEY MTR SLVR 16FR STAT (SET/KITS/TRAYS/PACK) ×5 IMPLANT
TUBING HIGH PRESSURE 120CM (CONNECTOR) ×5 IMPLANT
WATER STERILE IRR 1000ML POUR (IV SOLUTION) ×5 IMPLANT
WIRE BENTSON .035X145CM (WIRE) ×2 IMPLANT
WIRE STIFF LUNDERQUIST 260CM (WIRE) ×2 IMPLANT

## 2018-11-26 NOTE — Progress Notes (Signed)
Pt received from PACU. Family at bedside. VSS. Telemetry applied, CCMD notified. L neck and R groin incision clean, dry, intact with approximated margins. R IJ and R art-line with clean, dry, intact dressings.    Leonidas Romberg, RN

## 2018-11-26 NOTE — Interval H&P Note (Signed)
History and Physical Interval Note:  11/26/2018 11:40 AM  Garrett Fleming  has presented today for surgery, with the diagnosis of LEFT SUBCLAVIAN STENOSIS/ULCER THORACI AORTA  The various methods of treatment have been discussed with the patient and family. After consideration of risks, benefits and other options for treatment, the patient has consented to  Procedure(s): LEFT CAROTID TO SUBCLAVIAN ARTERY BYPASS GRAFT (Left) THORACIC AORTIC ENDOVASCULAR STENT GRAFT (N/A) Ultrasound Guidance For Vascular Access (Bilateral) as a surgical intervention .  The patient's history has been reviewed, patient examined, no change in status, stable for surgery.  I have reviewed the patient's chart and labs.  Questions were answered to the patient's satisfaction.     Durene Cal

## 2018-11-26 NOTE — Anesthesia Procedure Notes (Signed)
Arterial Line Insertion Start/End2/28/2020 10:06 AM, 11/26/2018 10:16 AM Performed by: Cecile Hearing, MD  Patient location: Pre-op. Preanesthetic checklist: patient identified, IV checked, site marked, risks and benefits discussed, surgical consent, monitors and equipment checked, pre-op evaluation, timeout performed and anesthesia consent Lidocaine 1% used for infiltration and patient sedated Right, radial was placed Catheter size: 20 Fr Hand hygiene performed  and maximum sterile barriers used   Attempts: 1 Procedure performed using ultrasound guided technique. Ultrasound Notes:anatomy identified, needle tip was noted to be adjacent to the nerve/plexus identified, no ultrasound evidence of intravascular and/or intraneural injection and image(s) printed for medical record Following insertion, dressing applied and Biopatch. Post procedure assessment: normal and unchanged  Patient tolerated the procedure well with no immediate complications.

## 2018-11-26 NOTE — Anesthesia Procedure Notes (Signed)
Central Venous Catheter Insertion Performed by: Cecile Hearing, MD, anesthesiologist Start/End2/28/2020 9:55 AM, 11/26/2018 10:05 AM Patient location: Pre-op. Preanesthetic checklist: patient identified, IV checked, site marked, risks and benefits discussed, surgical consent, monitors and equipment checked, pre-op evaluation, timeout performed and anesthesia consent Position: Trendelenburg Lidocaine 1% used for infiltration and patient sedated Hand hygiene performed  and maximum sterile barriers used  Catheter size: 8 Fr Total catheter length 16. Central line was placed.Double lumen Procedure performed using ultrasound guided technique. Ultrasound Notes:image(s) printed for medical record Attempts: 1 Following insertion, dressing applied, line sutured and Biopatch. Post procedure assessment: blood return through all ports, free fluid flow and no air  Patient tolerated the procedure well with no immediate complications.

## 2018-11-26 NOTE — Transfer of Care (Signed)
Immediate Anesthesia Transfer of Care Note  Patient: Garrett Fleming  Procedure(s) Performed: LEFT CAROTID TO SUBCLAVIAN ARTERY TRANSPOSITION (Left Neck) THORACIC AORTIC ENDOVASCULAR STENT using a GORE TAG CONFORMABLE THORACIC GRAFT (N/A Abdomen) Ultrasound Guidance For Vascular Access (Bilateral Groin) Intravascular Ultrasound (N/A Abdomen)  Patient Location: PACU  Anesthesia Type:General  Level of Consciousness: awake, alert , oriented and patient cooperative  Airway & Oxygen Therapy: Patient Spontanous Breathing  Post-op Assessment: Report given to RN, Post -op Vital signs reviewed and stable and Patient moving all extremities X 4  Post vital signs: Reviewed and stable  Last Vitals:  Vitals Value Taken Time  BP 142/82 11/26/2018  3:31 PM  Temp    Pulse 68 11/26/2018  3:32 PM  Resp 13 11/26/2018  3:32 PM  SpO2 94 % 11/26/2018  3:32 PM  Vitals shown include unvalidated device data.  Last Pain:  Vitals:   11/26/18 0939  TempSrc:   PainSc: 8       Patients Stated Pain Goal: 2 (11/26/18 2876)  Complications: No apparent anesthesia complications

## 2018-11-26 NOTE — Anesthesia Postprocedure Evaluation (Signed)
Anesthesia Post Note  Patient: Garrett Fleming  Procedure(s) Performed: LEFT CAROTID TO SUBCLAVIAN ARTERY TRANSPOSITION (Left Neck) THORACIC AORTIC ENDOVASCULAR STENT using a GORE TAG CONFORMABLE THORACIC GRAFT (N/A Abdomen) Ultrasound Guidance For Vascular Access (Bilateral Groin) Intravascular Ultrasound (N/A Abdomen)     Patient location during evaluation: PACU Anesthesia Type: General Level of consciousness: awake and alert Pain management: pain level controlled Vital Signs Assessment: post-procedure vital signs reviewed and stable Respiratory status: spontaneous breathing, nonlabored ventilation and respiratory function stable Cardiovascular status: blood pressure returned to baseline and stable Postop Assessment: no apparent nausea or vomiting Anesthetic complications: no Comments: Patient received tylenol pre-operatively (in the form of percocet) plus intra-operative dexamethasone IV.    Last Vitals:  Vitals:   11/26/18 1600 11/26/18 1615  BP: (!) 148/85 (!) 145/80  Pulse: 63 (!) 57  Resp: 16 18  Temp:    SpO2: 96% 97%    Last Pain:  Vitals:   11/26/18 1600  TempSrc:   PainSc: Asleep                 Cecile Hearing

## 2018-11-26 NOTE — Anesthesia Procedure Notes (Signed)
Procedure Name: Intubation Date/Time: 11/26/2018 12:35 PM Performed by: Burt Ek, CRNA Pre-anesthesia Checklist: Patient identified, Suction available, Patient being monitored and Emergency Drugs available Oxygen Delivery Method: Circle system utilized Preoxygenation: Pre-oxygenation with 100% oxygen Induction Type: IV induction Ventilation: Mask ventilation without difficulty Laryngoscope Size: Miller and 2 Grade View: Grade I Tube type: Oral Tube size: 7.5 mm Number of attempts: 1 Airway Equipment and Method: Stylet Placement Confirmation: ETT inserted through vocal cords under direct vision,  positive ETCO2,  CO2 detector and breath sounds checked- equal and bilateral Secured at: 22 (@ teeth) cm Tube secured with: Tape Dental Injury: Teeth and Oropharynx as per pre-operative assessment  Comments: Performed by Edmonia Lynch

## 2018-11-26 NOTE — Op Note (Addendum)
Patient name: Garrett Fleming MRN: [id number] DOB: 05-Feb-1956 Sex: male  11/26/2018 Pre-operative Diagnosis: Thoracic aortic penetrating ulcer Post-operative diagnosis:  Same Surgeon:  Durene Cal Assistants: Cari Caraway, Aggie Moats Procedure:   #1: Left subclavian to left carotid transposition   #2: Endovascular repair of left thoracic aneurysm with coverage of subclavian artery   #3: Ultrasound-guided right common femoral artery access   #4: Intravascular ultrasound (IVUS) thoracic aorta   #5: Aortic arch angiogram Anesthesia: General Blood Loss: Minimal Specimens: None  Findings: Complete exclusion of the penetrating ulcer  Devices used: Conformable GORE TAG 37X15  Indications: The patient presented with chest pain.  Imaging revealed a penetrating ulcer at the level of the left subclavian artery.  We tried to manage him nonoperatively however he had persistent pain.  Repeat CT scan showed progression and enlargement of the ulcer and so the decision was made to proceed with surgical repair.  Procedure:  The patient was identified in the holding area and taken to [facility] OR ROOM 16  The patient was then placed supine on the table. general anesthesia was administered.  The patient was prepped and draped in the usual sterile fashion.  A time out was called and antibiotics were administered.  A left supraclavicular incision was made beginning just lateral to the midline extending out 7 cm.  Cautery was used to divide subcutaneous tissue and platysma muscle.  Subplatysmal flaps were then raised.  I then identified the sternal and clavicular heads of the sternocleidomastoid and created a space between them.  I did divide a portion of the sternal head.  Wheatland retractors were placed.  I then identified the internal jugular vein and dissected this free.  I then isolated the common carotid artery and isolated this as well.  The vagus nerve was seen posteriorly within the carotid sheath and  protected.  I then dissected between the internal jugular vein and common carotid artery until I identified the subclavian artery.  I divided the vertebral vein.  No major lymphatic structures were identified.  I then encircled the subclavian artery with a vessel loop.  I also encircled the vertebral artery with a vessel loop.  I then proceeded with proximal dissection of the subclavian artery until I had enough of this exposed to where it could be divided.  At this point, the patient was fully heparinized.  Heparin levels were monitored with ACT measurements.  After the heparin levels were appropriate, a Henley clamp was placed on the proximal subclavian artery.  The vessel loop was tightened on the vertebral artery and a angled DeBakey clamp was placed on the subclavian artery.  I then used a #11 blade and Potts scissors to transect the subclavian artery proximal to the vertebral.  The stump of the subclavian artery was then oversewn with 5-0 Prolene in 2 layers, incorporating 2 felt strips on either side of the closure.  Once this was completed the proximal clamp was released and the closure was hemostatic.  Attention was then turned towards the carotid subclavian anastomosis.  The common carotid artery was occluded with vascular clamps.  A #11 blade was used to make an arteriotomy on the posterior lateral side of the carotid artery.  This was opened with Potts scissors and then a 5 mm punch was used to create a better opening.  I then created a running anastomosis in a end-to-side fashion between the common carotid artery and the subclavian artery.  Prior to completion the appropriate flushing maneuvers were  performed and the anastomosis was completed.  It was hemostatic.  The neck incision was then packed with a Ray-Tec.  Attention was then turned towards the right groin.  The right common femoral artery was evaluated with ultrasound and found to be patent without significant calcification.  A #11 blade was  used to make a skin nick.  The common femoral artery was then cannulated under ultrasound guidance with a micropuncture needle.  An 018 wire was advanced without resistance followed by placement of micropuncture sheath.  A Bentson wire was then inserted.  The subcutaneous tract was dilated with an 8 [place] dilator.  Pro-glide devices were then deployed in the 11:00 and 1 o'clock position for pre-closure.  An 8 French sheath was placed.  A pigtail catheter was then inserted over the Bentson wire into the ascending aorta.  The Bentson wire was removed and a Lunderquist double curve wire was placed.  Additional heparin was given at this time.  Next, a IVUS catheter was used to interrogate the thoracic aorta and aortic arch.  We visualized the ulcer which was at the level of the left subclavian artery.  There was no other significant aortic pathology.  The IVUS catheter was then removed.  The 8 French sheath was exchanged out for a 22 [place] sheath.  A conformable Gore TAG 37 x 15 thoracic device was then inserted into the descending thoracic aorta.  A second access through the dry seal sheath was then obtained and a pigtail catheter was advanced into the a sending aorta.  An aortic arch angiogram was performed.  This identified the origin of the common carotid artery and also illustrated that the carotid subclavian transposition was widely patent.  The device was then positioned appropriately and deployed.  The pigtail catheter was then removed.  The delivery system was then removed and the pigtail catheter was advanced over the Lunderquist wire.  A completion arteriogram was performed.  This showed successful exclusion of the penetrating ulcer with continued patency of the left carotid artery and carotid subclavian transposition.  I did not balloon the stent graft as there was good exclusion.  A Bentson wire was then inserted and the pigtail catheter was removed.  Next, the sheath was removed and the pro-glide devices  were secured closing the arteriotomy site.  50 mg of protamine was used to reverse the heparin.  Manual pressure was held on the groin until it was hemostatic.  The skin nick was closed with a 4-0 Vicryl and Dermabond.  Attention was then turned towards the neck incision.  This was hemostatic.  There was no lymphatic drainage.  I then reapproximated the platysma muscle with 3-0 Vicryl and the skin incision was closed with 3-0 Vicryl and Dermabond.  The patient was successfully extubated.  He had palpable pedal pulses.  He was taken to recovery room in stable condition.  There were no immediate complications.   Disposition: To PACU stable.   Juleen China, M.D. [facility] [place] Office: [phone] Pager:  [phone]

## 2018-11-26 NOTE — Anesthesia Preprocedure Evaluation (Addendum)
Anesthesia Evaluation  Patient identified by MRN, date of birth, ID band Patient awake    Reviewed: Allergy & Precautions, NPO status , Patient's Chart, lab work & pertinent test results  Airway Mallampati: II  TM Distance: >3 FB Neck ROM: Full    Dental  (+) Dental Advisory Given, Poor Dentition, Missing, Chipped   Pulmonary Current Smoker,    Pulmonary exam normal breath sounds clear to auscultation       Cardiovascular hypertension, Pt. on home beta blockers and Pt. on medications + Peripheral Vascular Disease  Normal cardiovascular exam Rhythm:Regular Rate:Normal  Echo 11/17/2018: 1. The left ventricle has normal systolic function, with an ejection fraction of 55-60%. The cavity size was normal. Left ventricular diastolic parameters were normal.  2. The right ventricle has normal systolic function. The cavity was normal. There is no increase in right ventricular wall thickness.  3. Left atrial size was moderately dilated.  4. The mitral valve is normal in structure. Mild thickening of the mitral valve leaflet.  5. The tricuspid valve is normal in structure.  6. The aortic valve is tricuspid Mild thickening of the aortic valve Sclerosis without any evidence of stenosis of the aortic valve.  7. The pulmonic valve was normal in structure.  8. There is mild dilatation of the aortic root measuring 39 mm.   Neuro/Psych negative neurological ROS  negative psych ROS   GI/Hepatic negative GI ROS, Neg liver ROS,   Endo/Other  negative endocrine ROS  Renal/GU negative Renal ROS     Musculoskeletal  (+) Arthritis ,   Abdominal   Peds  Hematology  (+) Blood dyscrasia, anemia ,   Anesthesia Other Findings Day of surgery medications reviewed with the patient.  Reproductive/Obstetrics                            Anesthesia Physical Anesthesia Plan  ASA: IV  Anesthesia Plan: General   Post-op Pain  Management:    Induction: Intravenous  PONV Risk Score and Plan: 1 and Ondansetron, Midazolam and Dexamethasone  Airway Management Planned: Oral ETT  Additional Equipment: Arterial line, CVP and Ultrasound Guidance Line Placement  Intra-op Plan:   Post-operative Plan: Possible Post-op intubation/ventilation  Informed Consent: I have reviewed the patients History and Physical, chart, labs and discussed the procedure including the risks, benefits and alternatives for the proposed anesthesia with the patient or authorized representative who has indicated his/her understanding and acceptance.     Dental advisory given  Plan Discussed with: CRNA  Anesthesia Plan Comments:        Anesthesia Quick Evaluation

## 2018-11-26 NOTE — Progress Notes (Signed)
Patient came to short stay with boxers and socks on.  Tubed to 4E.

## 2018-11-27 DIAGNOSIS — Z87891 Personal history of nicotine dependence: Secondary | ICD-10-CM

## 2018-11-27 MED ORDER — AMLODIPINE BESYLATE 5 MG PO TABS
5.0000 mg | ORAL_TABLET | Freq: Every day | ORAL | Status: DC
  Administered 2018-11-27 – 2018-11-28 (×2): 5 mg via ORAL
  Filled 2018-11-27 (×2): qty 1

## 2018-11-27 MED ORDER — LABETALOL HCL 5 MG/ML IV SOLN: 5.0000 mg | INTRAVENOUS | Status: DC | PRN

## 2018-11-27 NOTE — Consult Note (Signed)
  Date: 11/27/2018               Patient Name:  Garrett Fleming MRN: [id number]  DOB: [birth year] Age / Sex: 63 y.o., male   PCP: [facility]         Requesting Physician: Dr. Nada Libman, MD    Consulting Reason:  Blood pressure regulation     Chief Complaint: Penetrating aortic ulcer with intramural hematoma   History of Present Illness: Garrett Fleming is a 63 year old male with newly diagnosed hypertension as well as a history of tobacco and cocaine use who presented with chest pain rating to the back and associated shortness of breath. He was ultimately found to have a penetrating aortic ulcer with intraluminal hematoma.  He underwent endovascular repair of the left thoracic aneurysm with coverage of the subclavian artery and left subclavian to left carotid transposition by vascular surgery.  We have been consulted to assist with the patient's blood pressure medication control as he will require lifelong antihypertensive therapy.  Vital signs in last 24 hours: Vitals:   11/27/18 0045 11/27/18 0453 11/27/18 0852 11/27/18 1307  BP: 137/80 (!) 155/82 132/79 119/73  Pulse: (!) 59 62 (!) 56   Resp: 15 19 12 16   Temp: 99 F (37.2 C) 99 F (37.2 C) 98.9 F (37.2 C) 99 F (37.2 C)  TempSrc: Oral Oral Oral Oral  SpO2: 95% 96% 96% 94%  Weight: 89.7 kg     Height:       General: A/O x4, in no acute distress, afebrile, nondiaphoretic Cardio: RRR, no mrg's Pulmonary: CTA bilaterally MSK: BLE nontender, nonedematous Psych: Appropriate affect, not depressed in appearance, mild somnolence   Assessment/Plan:  Principal Problem:   Penetrating atherosclerotic ulcer of aorta (HCC) Active Problems:   Essential hypertension   Smoking   Cocaine abuse (HCC)  Uncontrolled HTN: Likley 2/2 to chronic cocaine use vs essential. The patients trend appears to be irregular with wide degree of fluctuation from 180's to 120's systolic.  Current BP therapy with labetalol does  not appear to induce stable bp regulation Would recommend an antihypertensive with a longer half life such as Amlodipine and continuing lisinopril. I feel that once daily dosing would greatly benefit this patient with regard to regular adherence and provide a more regular therapeutic advantage.  Ordered Amlodipine 5mg  daily, titrate upward as tolerated. Would titrate off of labetalol daily Continue labetalol 5mg  PRN for systolic BP > 130 today  Continue Lisinopril  BMP in am Will continue to follow to further titrate treatment  Thank you for the consult.    Lanelle Bal, MD 11/27/2018, 1:25 PM Pager# [phone]

## 2018-11-27 NOTE — Progress Notes (Signed)
  Progress Note    11/27/2018 11:35 AM 1 Day Post-Op  Subjective: No overnight issues  Vitals:   11/27/18 0453 11/27/18 0852  BP: (!) 155/82 132/79  Pulse: 62 (!) 56  Resp: 19 12  Temp: 99 F (37.2 C) 98.9 F (37.2 C)  SpO2: 96% 96%    Physical Exam: Awake alert oriented Abdomen is soft Bilateral femoral pulses palpable Left neck incision clean dry intact Palpable left radial artery pulse at the wrist  CBC    Component Value Date/Time   WBC 9.4 11/26/2018 0321   RBC 3.79 (L) 11/26/2018 0321   HGB 11.9 (L) 11/26/2018 0321   HCT 36.8 (L) 11/26/2018 0321   PLT 344 11/26/2018 0321   MCV 97.1 11/26/2018 0321   MCH 31.4 11/26/2018 0321   MCHC 32.3 11/26/2018 0321   RDW 11.7 11/26/2018 0321    BMET    Component Value Date/Time   NA 137 11/20/2018 0322   K 4.5 11/20/2018 0322   CL 105 11/20/2018 0322   CO2 24 11/20/2018 0322   GLUCOSE 135 (H) 11/20/2018 0322   BUN 9 11/20/2018 0322   CREATININE 0.95 11/23/2018 0527   CALCIUM 8.3 (L) 11/20/2018 0322   GFRNONAA >60 11/23/2018 0527   GFRAA >60 11/23/2018 0527    INR    Component Value Date/Time   INR 0.94 11/16/2018 2342     Intake/Output Summary (Last 24 hours) at 11/27/2018 1135 Last data filed at 11/27/2018 1000 Gross per 24 hour  Intake 1540 ml  Output 1455 ml  Net 85 ml     Assessment:  63 y.o. male is s/p:                          #1: Left subclavian to left carotid transposition                         #2: Endovascular repair of left thoracic aneurysm with coverage of subclavian artery                         #3: Ultrasound-guided right common femoral artery access                         #4: Intravascular ultrasound (IVUS) thoracic aorta                         #5: Aortic arch angiogram  Plan: Arterial line and Foley catheter can be removed Family medicine following for blood pressure control and much appreciated Diet and out of bed as tolerated Lovenox for DVT prophylaxis.   Nusaybah Ivie C.  Randie Heinz, MD [facility] [place] Office: [phone] Pager: [phone]  11/27/2018 11:35 AM

## 2018-11-28 MED ORDER — LABETALOL HCL 100 MG PO TABS
100.0000 mg | ORAL_TABLET | Freq: Three times a day (TID) | ORAL | Status: DC
  Administered 2018-11-28 – 2018-11-29 (×4): 100 mg via ORAL
  Filled 2018-11-28 (×4): qty 1

## 2018-11-28 MED ORDER — AMLODIPINE BESYLATE 10 MG PO TABS
10.0000 mg | ORAL_TABLET | Freq: Every day | ORAL | Status: DC
  Administered 2018-11-29 – 2018-11-30 (×2): 10 mg via ORAL
  Filled 2018-11-28 (×2): qty 1

## 2018-11-28 NOTE — Consult Note (Signed)
  Date: 11/28/2018               Patient Name:  Garrett Fleming MRN: [id number]  DOB: 13-Aug-1956 Age / Sex: 63 y.o., male   PCP: [facility]         Requesting Physician: Dr. Nada Libman, MD    Consulting Reason:   Hypertension     Subjective: The patient was lying in his bed today asleep today upon entering the room.  He continues to endorse mild neck pain but states that overall he feels well.  He denied headache, visual changes, chest pain, palpitations, lower extremity swelling or other concerns.  Objective:  Vital signs in last 24 hours: Vitals:   11/27/18 1641 11/27/18 2034 11/27/18 2355 11/28/18 0544  BP: 107/65 108/65 110/72 111/62  Pulse:  66 64 64  Resp: 14 17 (!) 23 15  Temp: 98.2 F (36.8 C) 99.2 F (37.3 C) 97.9 F (36.6 C) 99.2 F (37.3 C)  TempSrc: Oral Oral Oral Oral  SpO2: 97% 94% 95% 100%  Weight:    89.7 kg  Height:       General: A/O x4, in no acute distress, afebrile, nondiaphoretic Cardio: RRR, no mrg's Pulmonary: CTA bilaterally MSK: BLE nontender, nonedematous Psych: Appropriate affect, not depressed in appearance, engages well  Assessment/Plan:  Principal Problem:   Penetrating atherosclerotic ulcer of aorta (HCC) Active Problems:   Essential hypertension   Smoking   Cocaine abuse (HCC)  Garrett Fleming is a 63 year old male with newly diagnosed hypertension as well as a history of tobacco and cocaine use who presented with chest pain rating to the back and associated shortness of breath. He was ultimately found to have a penetrating aortic ulcer with intraluminal hematoma.  He underwent endovascular repair of the left thoracic aneurysm with coverage of the subclavian artery and left subclavian to left carotid transposition by vascular surgery.  We have been consulted to assist with the patient's blood pressure medication control as he will require lifelong antihypertensive therapy.  HTN: Likley 2/2 to chronic cocaine use  vs essential. The patients trend appears to be irregular with wide degree of fluctuation from 180's to 120's systolic initially but greatly improved today. Goal <120/80. Have initiated amlodipine low dose to better allow for a more regular BP. He appears to have been stable overnight in the 110's systolic with slightly lower HR ~50-60's.  Continue Amlodipine 5mg  daily, titrate upward as after 48 hours if needed Would titrate off of labetalol daily, decreased to 100mg  TID today, then 100 BID and consider coreg once daily as able.  Continue labetalol 5mg  PRN for systolic BP > 140 today  Continue Lisinopril 5mg  daily BMP in am ordered Will continue to follow to further titrate treatment   Signed: Lanelle Bal, MD 11/28/2018, 6:29 AM

## 2018-11-28 NOTE — Progress Notes (Signed)
  Progress Note    11/28/2018 1:22 PM 2 Days Post-Op  Subjective: No overnight issues  Vitals:   11/28/18 0743 11/28/18 1236  BP: 115/73 122/71  Pulse: 68 70  Resp: 15 (!) 24  Temp: 98.8 F (37.1 C) 98.4 F (36.9 C)  SpO2: 95% 97%    Physical Exam: awake alert oriented Abdomen soft Bilateral common femoral pulses are palpable as well as left radial artery pulse Left neck incision clean dry intact with minimal swelling  CBC    Component Value Date/Time   WBC 9.4 11/26/2018 0321   RBC 3.79 (L) 11/26/2018 0321   HGB 11.9 (L) 11/26/2018 0321   HCT 36.8 (L) 11/26/2018 0321   PLT 344 11/26/2018 0321   MCV 97.1 11/26/2018 0321   MCH 31.4 11/26/2018 0321   MCHC 32.3 11/26/2018 0321   RDW 11.7 11/26/2018 0321    BMET    Component Value Date/Time   NA 137 11/20/2018 0322   K 4.5 11/20/2018 0322   CL 105 11/20/2018 0322   CO2 24 11/20/2018 0322   GLUCOSE 135 (H) 11/20/2018 0322   BUN 9 11/20/2018 0322   CREATININE 0.95 11/23/2018 0527   CALCIUM 8.3 (L) 11/20/2018 0322   GFRNONAA >60 11/23/2018 0527   GFRAA >60 11/23/2018 0527    INR    Component Value Date/Time   INR 0.94 11/16/2018 2342     Intake/Output Summary (Last 24 hours) at 11/28/2018 1322 Last data filed at 11/28/2018 5883 Gross per 24 hour  Intake 698 ml  Output 1001 ml  Net -303 ml     Assessment:  63 y.o. male is s/p:                          #1: Left subclavian to left carotid transposition #2: Endovascular repair of left thoracic aneurysm with coverage of subclavian artery #3: Ultrasound-guided right common femoral artery access #4: Intravascular ultrasound (IVUS)thoracic aorta #5: Aortic arch angiogram   Plan: Doing well postoperatively Continue to mobilize Appreciate internal medicine management of blood pressure   Brandon C. Randie Heinz, MD [facility] Office:  [phone] Pager: [phone]  11/28/2018 1:22 PM

## 2018-11-29 ENCOUNTER — Encounter (HOSPITAL_COMMUNITY): Payer: Self-pay | Admitting: Surgery

## 2018-11-29 LAB — BASIC METABOLIC PANEL
Anion gap: 8 (ref 5–15)
BUN: 15 mg/dL (ref 8–23)
CO2: 26 mmol/L (ref 22–32)
Calcium: 8.4 mg/dL — ABNORMAL LOW (ref 8.9–10.3)
Chloride: 103 mmol/L (ref 98–111)
Creatinine, Ser: 0.98 mg/dL (ref 0.61–1.24)
GFR calc Af Amer: >60 mL/min (ref >60)
GFR calc non Af Amer: >60 mL/min (ref >60)
Glucose, Bld: 129 mg/dL — ABNORMAL HIGH (ref 70–99)
Potassium: 4.3 mmol/L (ref 3.5–5.1)
Sodium: 137 mmol/L (ref 135–145)

## 2018-11-29 MED ORDER — CARVEDILOL PHOSPHATE ER 10 MG PO CP24
10.0000 mg | ORAL_CAPSULE | Freq: Every day | ORAL | Status: DC
  Administered 2018-11-29: 10 mg via ORAL
  Filled 2018-11-29: qty 1

## 2018-11-29 MED ORDER — MORPHINE SULFATE (PF) 2 MG/ML IV SOLN: 2.0000 mg | INTRAVENOUS | Status: AC | PRN

## 2018-11-29 MED ORDER — HYDRALAZINE HCL 20 MG/ML IJ SOLN: 2.0000 mg | Freq: Three times a day (TID) | INTRAMUSCULAR | Status: DC | PRN

## 2018-11-29 NOTE — Progress Notes (Signed)
Notified PA Eveland and Dr Crista Elliot regarding IJ. Both agree to d/c IJ verbally. Pt does have peripheral iv access to R wrist. Flushed and blood return noted. IJ removed and intact. Pt tolerated well. Will continue to monitor  Lacy Duverney, RN

## 2018-11-29 NOTE — Progress Notes (Signed)
   Subjective: The patient was resting in his bed today upon entering the room. He endorses chest pain that is sharp, substernal and left sided. This pain is pleuritic in nature but new as of this am. He denied palpitations, headache, visual changes, nausea or diaphoresis.   Objective:  Vital signs in last 24 hours: Vitals:   11/29/18 0322 11/29/18 0753 11/29/18 0815 11/29/18 0816  BP: 113/73 136/76 136/76 136/76  Pulse: 73 66 61   Resp: 17 (!) 22    Temp: 98.4 F (36.9 C) 98.7 F (37.1 C)    TempSrc: Oral Oral    SpO2: 98% 100%    Weight: 88 kg     Height:       General: A/O x4, in no acute distress, afebrile, nondiaphoretic Cardio: RRR, GII Systolic murmur Pulmonary: CTA bilaterally MSK: BLE nontender, nonedematous Psych: Appropriate affect, not depressed in appearance, engages well  Assessment/Plan:  Principal Problem:   Penetrating atherosclerotic ulcer of aorta (HCC) Active Problems:   Essential hypertension   Smoking   Cocaine abuse (HCC)  Garrett Fleming is a 63 year old male with newly diagnosed hypertensionas well as ahistory of tobacco and cocaine use who presented with chest pain rating to the back and associated shortness of breath. He wasultimately found to have a penetrating aortic ulcer with intraluminal hematoma. He underwent endovascular repair of the left thoracic aneurysm with coverage of thesubclavian artery and left subclavian to left carotid transposition by vascular surgery. We have been consulted to assist with the patient's blood pressure medication control as he will require lifelong antihypertensive therapy.  HTN: Likley 2/2 to chronic cocaine use vs essential. The patients trend has improved with a few higher pressures in the 130's systolic. He appears to have been stable overnight in the 110's systolic.  Patient experiencing chest pain this am that is sharp, 5-8/10 pleuritic not associated with diaphoresis, palpitations, hypoxia or back pain.  Uncertain as to how much of this is expected. He is on therapeutic VTE PPX but given his surgery I am uncertain as to if this is related to his surgery. EKG was performed demonstrating NSR, absent ST elevation or depression consistent with MI, pulse ox stable at 97% despite unconfirmed results in the chart. Paint improved with percocet.  Continue Amlodipine increased to 10mg  daily Stop labetalol Start coreg 10mg  long acting daily, consider uptitrating once labetalol has cleared his system if needed. Continue Lisinopril 5mg  daily Thank you for the consult.  Lanelle Bal, MD 11/29/2018, 9:45 AM Pager# [phone]

## 2018-11-29 NOTE — Progress Notes (Addendum)
  Progress Note    11/29/2018 7:18 AM 3 Days Post-Op  Subjective:  Persistent pain L neck around incision   Vitals:   11/28/18 2349 11/29/18 0322  BP: 118/71 113/73  Pulse: 74 73  Resp: 16 17  Temp: 98.4 F (36.9 C) 98.4 F (36.9 C)  SpO2: 99% 98%   Physical Exam: Lungs:  Non labored Incisions:  L neck incision c/d/i; R groin cath site soft, no hematoma Extremities:  Palpable DP pulses BLE; moving all extremities well Neurologic: A&O  CBC    Component Value Date/Time   WBC 9.4 11/26/2018 0321   RBC 3.79 (L) 11/26/2018 0321   HGB 11.9 (L) 11/26/2018 0321   HCT 36.8 (L) 11/26/2018 0321   PLT 344 11/26/2018 0321   MCV 97.1 11/26/2018 0321   MCH 31.4 11/26/2018 0321   MCHC 32.3 11/26/2018 0321   RDW 11.7 11/26/2018 0321    BMET    Component Value Date/Time   NA 137 11/29/2018 0412   K 4.3 11/29/2018 0412   CL 103 11/29/2018 0412   CO2 26 11/29/2018 0412   GLUCOSE 129 (H) 11/29/2018 0412   BUN 15 11/29/2018 0412   CREATININE 0.98 11/29/2018 0412   CALCIUM 8.4 (L) 11/29/2018 0412   GFRNONAA >60 11/29/2018 0412   GFRAA >60 11/29/2018 0412    INR    Component Value Date/Time   INR 0.94 11/16/2018 2342     Intake/Output Summary (Last 24 hours) at 11/29/2018 0718 Last data filed at 11/29/2018 0327 Gross per 24 hour  Intake 340 ml  Output 551 ml  Net -211 ml     Assessment/Plan:  63 y.o. male is s/p #1: Left subclavian to left carotid transposition #2: Endovascular repair of left thoracic aneurysm with coverage of subclavian artery #3: Ultrasound-guided right common femoral artery access #4: Intravascular ultrasound (IVUS)thoracic aorta #5: Aortic arch angiogram   3 Days Post-Op   Perfusing BLE well L neck incision and R groin cath site unremarkable Ween from morphine today D/c home when ok with medicine on recommended antihypertensive regimen     Emilie Rutter, PA-C [facility] [phone] 11/29/2018 7:18 AM   Agree with the above.  OK to d/c from surgical perspective once medical issues resolved.   WELLS Darla Mcdonald

## 2018-11-30 LAB — CREATININE, SERUM
Creatinine, Ser: 0.94 mg/dL (ref 0.61–1.24)
GFR calc Af Amer: >60 mL/min (ref >60)
GFR calc non Af Amer: >60 mL/min (ref >60)

## 2018-11-30 MED ORDER — ATORVASTATIN CALCIUM 20 MG PO TABS: 20.0000 mg | ORAL_TABLET | Freq: Every day | ORAL | 1 refills | Status: DC

## 2018-11-30 MED ORDER — OXYCODONE-ACETAMINOPHEN 7.5-325 MG PO TABS: 1.0000 | ORAL_TABLET | ORAL | 0 refills | Status: DC | PRN

## 2018-11-30 MED ORDER — AMLODIPINE BESYLATE 10 MG PO TABS: 10.0000 mg | ORAL_TABLET | Freq: Every day | ORAL | 1 refills | Status: DC

## 2018-11-30 MED ORDER — CARVEDILOL PHOSPHATE ER 10 MG PO CP24: 10.0000 mg | ORAL_CAPSULE | Freq: Every day | ORAL | 1 refills | Status: DC

## 2018-11-30 MED ORDER — ASPIRIN EC 81 MG PO TBEC: 81.0000 mg | DELAYED_RELEASE_TABLET | Freq: Every day | ORAL | 0 refills | Status: AC

## 2018-11-30 MED ORDER — LISINOPRIL 5 MG PO TABS: 5.0000 mg | ORAL_TABLET | Freq: Every day | ORAL | 1 refills | Status: DC

## 2018-11-30 NOTE — Progress Notes (Signed)
   Subjective: The patient was lying in his bed today upon entering the room. He denied acute concerns stating that his chest pain had improved. It is worse with lying flat and deep breaths, better with walking around and when he relaxes. He denied palpitations, headache, visual changes or abdominal pain.  Objective:  Vital signs in last 24 hours: Vitals:   11/29/18 2358 11/30/18 0359 11/30/18 0805 11/30/18 0807  BP: 119/74 123/81 117/75 117/75  Pulse: 91 98  73  Resp: 16 20  17   Temp: 98.6 F (37 C)   98.4 F (36.9 C)  TempSrc: Oral   Oral  SpO2: 95% 95%  96%  Weight:  87 kg    Height:       General: A/O x4, in no acute distress, afebrile, nondiaphoretic Cardio: RRR, GII systolic murmur Pulmonary: CTA bilaterally MSK: BLE nontender, nonedematous Psych: Appropriate affect, not depressed in appearance, engages well   Assessment/Plan:  Principal Problem:   Penetrating atherosclerotic ulcer of aorta (HCC) Active Problems:   Essential hypertension   Smoking   Cocaine abuse (HCC)  Patient Summary: "Garrett Fleming is a 63 year old male with newly diagnosed hypertensionas well as ahistory of tobacco and cocaine use who presented with chest pain rating to the back and associated shortness of breath. He wasultimately found to have a penetrating aortic ulcer with intraluminal hematoma. He underwent endovascular repair of the left thoracic aneurysm with coverage of thesubclavian artery and left subclavian to left carotid transposition by vascular surgery. We have been consulted to assist with the patient's blood pressure medication control as he will require lifelong antihypertensive therapy."  HTN: Likley 2/2 to chronic cocaine use vs essential. BP improved and appears stable on current regimen.  Chest pain has improved and given the nature appears unlikely to be primarily cardiac in nature. Possible post surgical pain, will defer this determination to the patients primary team.    ContinueAmlodipine 10mg  daily Continue Coreg 10mg  long acting daily, consider uptitrating once labetalol has cleared his system if needed. Continue Lisinopril5mg  daily Thank you for the consult. We will sign off today. Would recommend a BMP in one week from discharge for renal function and electrolyte monitoring.   Lanelle Bal, MD 11/30/2018, 9:50 AM Pager# [phone]

## 2018-11-30 NOTE — Progress Notes (Signed)
  Progress Note    11/30/2018 7:31 AM 4 Days Post-Op  Subjective:  Chest pain over the course of hospital stay still comes and goes however it has been controlled with p.o. medication   Vitals:   11/29/18 2358 11/30/18 0359  BP: 119/74 123/81  Pulse: 91 98  Resp: 16 20  Temp: 98.6 F (37 C)   SpO2: 95% 95%   Physical Exam: Lungs:  Non labored Incisions:  L neck incision c/d/i Extremities:  Palpable L radial; palpable DP pulses Abdomen:  Soft Neurologic: A&O  CBC    Component Value Date/Time   WBC 9.4 11/26/2018 0321   RBC 3.79 (L) 11/26/2018 0321   HGB 11.9 (L) 11/26/2018 0321   HCT 36.8 (L) 11/26/2018 0321   PLT 344 11/26/2018 0321   MCV 97.1 11/26/2018 0321   MCH 31.4 11/26/2018 0321   MCHC 32.3 11/26/2018 0321   RDW 11.7 11/26/2018 0321    BMET    Component Value Date/Time   NA 137 11/29/2018 0412   K 4.3 11/29/2018 0412   CL 103 11/29/2018 0412   CO2 26 11/29/2018 0412   GLUCOSE 129 (H) 11/29/2018 0412   BUN 15 11/29/2018 0412   CREATININE 0.94 11/30/2018 0526   CALCIUM 8.4 (L) 11/29/2018 0412   GFRNONAA >60 11/30/2018 0526   GFRAA >60 11/30/2018 0526    INR    Component Value Date/Time   INR 0.94 11/16/2018 2342     Intake/Output Summary (Last 24 hours) at 11/30/2018 0731 Last data filed at 11/30/2018 0300 Gross per 24 hour  Intake 361 ml  Output 850 ml  Net -489 ml     Assessment/Plan:  63 y.o. male is s/p #1: Left subclavian to left carotid transposition #2: Endovascular repair of left thoracic aneurysm with coverage of subclavian artery #3: Ultrasound-guided right common femoral artery access #4: Intravascular ultrasound (IVUS)thoracic aorta #5: Aortic arch angiogram   4 Days Post-Op   L neck incision healing well Chest discomfort controlled with p.o. medication; EKG negative Patient is anticipating discharge home today Ok for d/c from  vascular standpoint D/c this morning/afternoon if ok with Medicine team   Emilie Rutter, PA-C [facility] [phone] 11/30/2018 7:31 AM
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
## 2018-11-30 NOTE — Progress Notes (Signed)
Pt vitals stable. Pt iv removed and intact. Telebox removed and CCMD notified. Pt provided discharge instructions and education. PT has all belongings. Volunteers called to tx pt via wheelchair to meet ride. Lacy Duverney, RN

## 2018-11-30 NOTE — Discharge Instructions (Signed)
   [facility]   Discharge Instructions  Endovascular Aortic Aneurysm Repair  Please refer to the following instructions for your post-procedure care. Your surgeon or Physician Assistant will discuss any changes with you.  Activity  You are encouraged to walk as much as you can. You can slowly return to normal activities but must avoid strenuous activity and heavy lifting until your doctor tells you it's OK. Avoid activities such as vacuuming or swinging a gold club. It is normal to feel tired for several weeks after your surgery. Do not drive until your doctor gives the OK and you are no longer taking prescription pain medications. It is also normal to have difficulty with sleep habits, eating, and bowel movements after surgery. These will go away with time.  Bathing/Showering  You may shower after you go home. If you have an incision, do not soak in a bathtub, hot tub, or swim until the incision heals completely.  Incision Care  Shower every day. Clean your incision with mild soap and water. Pat the area dry with a clean towel. You do not need a bandage unless otherwise instructed. Do not apply any ointments or creams to your incision. If you clothing is irritating, you may cover your incision with a dry gauze pad.  Diet  Resume your normal diet. There are no special food restrictions following this procedure. A low fat/low cholesterol diet is recommended for all patients with vascular disease. In order to heal from your surgery, it is CRITICAL to get adequate nutrition. Your body requires vitamins, minerals, and protein. Vegetables are the best source of vitamins and minerals. Vegetables also provide the perfect balance of protein. Processed food has little nutritional value, so try to avoid this.  Medications  Resume taking all of your medications unless your doctor or nurse practitioner tells you not to. If your incision is causing pain, you may take  over-the-counter pain relievers such as acetaminophen (Tylenol). If you were prescribed a stronger pain medication, please be aware these medications can cause nausea and constipation. Prevent nausea by taking the medication with a snack or meal. Avoid constipation by drinking plenty of fluids and eating foods with a high amount of fiber, such as fruits, vegetables, and grains. Do not take Tylenol if you are taking prescription pain medications.   Follow up  Our office will schedule a follow-up appointment with a C.T. scan 3-4 weeks after your surgery.  Please call us immediately for any of the following conditions  Severe or worsening pain in your legs or feet or in your abdomen back or chest. Increased pain, redness, drainage (pus) from your incision sit. Increased abdominal pain, bloating, nausea, vomiting or persistent diarrhea. Fever of 101 degrees or higher. Swelling in your leg (s),  Reduce your risk of vascular disease  Stop smoking. If you would like help call [facility] at 1-800-QUIT-NOW (1-800-784-8669) or [facility] at [phone]. Manage your cholesterol Maintain a desired weight Control your diabetes Keep your blood pressure down  If you have questions, please call the office at [phone].   

## 2018-12-06 ENCOUNTER — Telehealth: Payer: Self-pay

## 2018-12-06 MED ORDER — CARVEDILOL PHOSPHATE ER 10 MG PO CP24: 10.0000 mg | ORAL_CAPSULE | Freq: Every day | ORAL | 0 refills | Status: DC

## 2018-12-06 NOTE — Telephone Encounter (Signed)
Pt needed a refill on Carvedilol. He is scheduled for a new patient visit with Dr. Mayford Knife on 12/17/2018

## 2018-12-10 ENCOUNTER — Other Ambulatory Visit: Payer: Self-pay

## 2018-12-10 DIAGNOSIS — I712 Thoracic aortic aneurysm, without rupture, unspecified: Secondary | ICD-10-CM

## 2018-12-16 ENCOUNTER — Telehealth: Payer: Self-pay | Admitting: Cardiology

## 2018-12-16 ENCOUNTER — Encounter: Payer: Self-pay | Admitting: Cardiology

## 2018-12-16 NOTE — Telephone Encounter (Signed)
New Message   Pt c/o medication issue:  1. Name of Medication: Carvedilol  2. How are you currently taking this medication (dosage and times per day)? 10mg    3. Are you having a reaction (difficulty breathing--STAT)? No  4. What is your medication issue? Pt is needing a different medication that his insurance will pay for or he needs prior authorization

## 2018-12-16 NOTE — Telephone Encounter (Signed)
Called patient to reschedule his appointment due to coronavirus.  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  He  underwent left subclavian to left carotid transposition with endovascular repair of the thoracic aorta with coverage of the subclavian artery by Dr. Myra Gianotti.  On discharge he was sent home on amlodipine 10 mg daily, carvedilol CR 10 mg daily and lisinopril 5 mg daily.  His blood pressure has been running around 137/92 mmHg but he has not been on the carvedilol because he said he could not afford it.  I recommended that he call Dr. Estanislado Spire office today to let them know that he cannot afford the long-acting carvedilol and to consider switching to short acting carvedilol 3.125 mg twice daily which should get his blood pressure under control.  Please reschedule his appointment for 4 weeks out and in inform patient to check blood pressures daily and follow-up with his PCP who he was supposed to follow-up for blood pressure management after being discharged.

## 2018-12-16 NOTE — Telephone Encounter (Signed)
Tired to call patient, says disconnected or no longer in service.

## 2018-12-17 ENCOUNTER — Ambulatory Visit: Payer: Medicaid Other | Admitting: Cardiology

## 2018-12-17 MED ORDER — CARVEDILOL 3.125 MG PO TABS: 3.1250 mg | ORAL_TABLET | Freq: Two times a day (BID) | ORAL | 3 refills | Status: DC

## 2018-12-17 NOTE — Telephone Encounter (Signed)
Duplicate encounter

## 2018-12-17 NOTE — Telephone Encounter (Signed)
Spoke with the patient, he expressed understanding about waiting 4 weeks to be seen. Sending in the short acting carvedilol. The patient asked about new PCP, provided them with the [facility] phone number. The patient had no further questions.

## 2019-01-05 NOTE — Telephone Encounter (Signed)
I tried to reach pt today to reschedule his New Pt appt with Dr. Mayford Knife from the [facility]. Called # on file for the pt and # is disconnected. I see he does have an emergency contact, though no DPR on file. I will send this message to Hetty Blend, RN for Dr. Mayford Knife.

## 2019-01-10 ENCOUNTER — Encounter: Payer: Medicaid Other | Admitting: Surgery

## 2019-01-18 NOTE — Telephone Encounter (Signed)
Attempted both numbers on file, not answer and unable to leave voicemail. Patient will need to call back to schedule appointment.

## 2019-01-20 ENCOUNTER — Encounter: Payer: Self-pay | Admitting: Cardiology

## 2019-01-20 NOTE — Telephone Encounter (Signed)
Follow Up:   Pt said he was retuning a call from 2 days ago. He did not know who called hi, I saw no documentation.

## 2019-01-20 NOTE — Telephone Encounter (Signed)
Duplicate encounter

## 2019-01-20 NOTE — Telephone Encounter (Signed)
Virtual Visit Pre-Appointment Phone Call  "(Name), I am calling you today to discuss your upcoming appointment. We are currently trying to limit exposure to the virus that causes COVID-19 by seeing patients at home rather than in the office."  1. "What is the BEST phone number to call the day of the visit?" - include this in appointment notes  2. "Do you have or have access to (through a family member/friend) a smartphone with video capability that we can use for your visit?" a. If yes - list this number in appt notes as "cell" (if different from BEST phone #) and list the appointment type as a VIDEO visit in appointment notes b. If no - list the appointment type as a PHONE visit in appointment notes  3. Confirm consent - "In the setting of the current Covid19 crisis, you are scheduled for a (phone or video) visit with your provider on (date) at (time).  Just as we do with many in-office visits, in order for you to participate in this visit, we must obtain consent.  If you'd like, I can send this to your mychart (if signed up) or email for you to review.  Otherwise, I can obtain your verbal consent now.  All virtual visits are billed to your insurance company just like a normal visit would be.  By agreeing to a virtual visit, we'd like you to understand that the technology does not allow for your provider to perform an examination, and thus may limit your provider's ability to fully assess your condition. If your provider identifies any concerns that need to be evaluated in person, we will make arrangements to do so.  Finally, though the technology is pretty good, we cannot assure that it will always work on either your or our end, and in the setting of a video visit, we may have to convert it to a phone-only visit.  In either situation, we cannot ensure that we have a secure connection.  Are you willing to proceed?" STAFF: Did the patient verbally acknowledge consent to telehealth visit? Document  YES/NO here: YES  4. Advise patient to be prepared - "Two hours prior to your appointment, go ahead and check your blood pressure, pulse, oxygen saturation, and your weight (if you have the equipment to check those) and write them all down. When your visit starts, your provider will ask you for this information. If you have an Apple Watch or Kardia device, please plan to have heart rate information ready on the day of your appointment. Please have a pen and paper handy nearby the day of the visit as well."  5. Give patient instructions for MyChart download to smartphone OR Doximity/Doxy.me as below if video visit (depending on what platform provider is using)  6. Inform patient they will receive a phone call 15 minutes prior to their appointment time (may be from unknown caller ID) so they should be prepared to answer    TELEPHONE CALL NOTE  Garrett Fleming has been deemed a candidate for a follow-up tele-health visit to limit community exposure during the Covid-19 pandemic. I spoke with the patient via phone to ensure availability of phone/video source, confirm preferred email & phone number, and discuss instructions and expectations.  I reminded Garrett Fleming to be prepared with any vital sign and/or heart rhythm information that could potentially be obtained via home monitoring, at the time of his visit. I reminded Garrett Fleming to expect a phone call prior to  his visit.  Dustin Flock, RN 01/20/2019 5:31 PM   INSTRUCTIONS FOR DOWNLOADING THE MYCHART APP TO SMARTPHONE  - The patient must first make sure to have activated MyChart and know their login information - If Apple, go to [employer] and type in MyChart in the search bar and download the app. If Android, ask patient to go to [employer] and type in [place] in the search bar and download the app. The app is free but as with any other app downloads, their phone may require them to verify saved payment information or  Apple/Android password.  - The patient will need to then log into the app with their MyChart username and password, and select [facility] as their healthcare provider to link the account. When it is time for your visit, go to the MyChart app, find appointments, and click Begin Video Visit. Be sure to Select Allow for your device to access the Microphone and Camera for your visit. You will then be connected, and your provider will be with you shortly.  **If they have any issues connecting, or need assistance please contact MyChart service desk [phone])**  **If using a computer, in order to ensure the best quality for their visit they will need to use either of the following Internet Browsers: [employer] or Google Chrome**  IF USING DOXIMITY or DOXY.ME - The patient will receive a link just prior to their visit by text.     FULL LENGTH CONSENT FOR TELE-HEALTH VISIT   I hereby voluntarily request, consent and authorize [facility] and its employed or contracted physicians, physician assistants, nurse practitioners or other licensed health care professionals (the Practitioner), to provide me with telemedicine health care services (the "Services") as deemed necessary by the treating Practitioner. I acknowledge and consent to receive the Services by the Practitioner via telemedicine. I understand that the telemedicine visit will involve communicating with the Practitioner through live audiovisual communication technology and the disclosure of certain medical information by electronic transmission. I acknowledge that I have been given the opportunity to request an in-person assessment or other available alternative prior to the telemedicine visit and am voluntarily participating in the telemedicine visit.  I understand that I have the right to withhold or withdraw my consent to the use of telemedicine in the course of my care at any time, without affecting my right to future care  or treatment, and that the Practitioner or I may terminate the telemedicine visit at any time. I understand that I have the right to inspect all information obtained and/or recorded in the course of the telemedicine visit and may receive copies of available information for a reasonable fee.  I understand that some of the potential risks of receiving the Services via telemedicine include:  Marland Kitchen Delay or interruption in medical evaluation due to technological equipment failure or disruption; . Information transmitted may not be sufficient (e.g. poor resolution of images) to allow for appropriate medical decision making by the Practitioner; and/or  . In rare instances, security protocols could fail, causing a breach of personal health information.  Furthermore, I acknowledge that it is my responsibility to provide information about my medical history, conditions and care that is complete and accurate to the best of my ability. I acknowledge that Practitioner's advice, recommendations, and/or decision may be based on factors not within their control, such as incomplete or inaccurate data provided by me or distortions of diagnostic images or specimens that may result from electronic transmissions.  I understand that the practice of medicine is not an exact science and that Practitioner makes no warranties or guarantees regarding treatment outcomes. I acknowledge that I will receive a copy of this consent concurrently upon execution via email to the email address I last provided but may also request a printed copy by calling the office of [facility].    I understand that my insurance will be billed for this visit.   I have read or had this consent read to me. . I understand the contents of this consent, which adequately explains the benefits and risks of the Services being provided via telemedicine.  . I have been provided ample opportunity to ask questions regarding this consent and the Services and have had  my questions answered to my satisfaction. . I give my informed consent for the services to be provided through the use of telemedicine in my medical care  By participating in this telemedicine visit I agree to the above.
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
## 2019-01-21 ENCOUNTER — Encounter: Payer: Self-pay | Admitting: Cardiology

## 2019-01-21 ENCOUNTER — Telehealth: Payer: Self-pay

## 2019-01-21 ENCOUNTER — Telehealth (INDEPENDENT_AMBULATORY_CARE_PROVIDER_SITE_OTHER): Payer: Medicaid Other | Admitting: Cardiology

## 2019-01-21 ENCOUNTER — Other Ambulatory Visit: Payer: Self-pay

## 2019-01-21 VITALS — Ht 76.0 in | Wt 192.7 lb

## 2019-01-21 DIAGNOSIS — Z7189 Other specified counseling: Secondary | ICD-10-CM | POA: Diagnosis not present

## 2019-01-21 DIAGNOSIS — I719 Aortic aneurysm of unspecified site, without rupture: Secondary | ICD-10-CM

## 2019-01-21 DIAGNOSIS — I10 Essential (primary) hypertension: Secondary | ICD-10-CM | POA: Diagnosis not present

## 2019-01-21 DIAGNOSIS — I2510 Atherosclerotic heart disease of native coronary artery without angina pectoris: Secondary | ICD-10-CM | POA: Diagnosis not present

## 2019-01-21 DIAGNOSIS — I700 Atherosclerosis of aorta: Secondary | ICD-10-CM

## 2019-01-21 DIAGNOSIS — I7121 Aneurysm of the ascending aorta, without rupture: Secondary | ICD-10-CM

## 2019-01-21 DIAGNOSIS — I712 Thoracic aortic aneurysm, without rupture: Secondary | ICD-10-CM

## 2019-01-21 HISTORY — DX: Aneurysm of the ascending aorta, without rupture: I71.21

## 2019-01-21 HISTORY — DX: Thoracic aortic aneurysm, without rupture: I71.2

## 2019-01-21 HISTORY — DX: Atherosclerotic heart disease of native coronary artery without angina pectoris: I25.10

## 2019-01-21 NOTE — Telephone Encounter (Signed)
   Primary Cardiologist: Dr. Mayford Knife  Pt contacted.  History and symptoms reviewed.  Pt will f/u with [facility] provider as scheduled.  Pt. advised that we are restricting visitors at this time and request that only patients present for check-in prior to their appointment.  All other visitors should remain in their car.  If necessary, only one visitor may come with the patient, into the building. For everyone's safety, all patients and visitor entering our practice area should expect to be screened again prior to entering our waiting area.  The patient is coming for a Nurse visit and Labs.   Dustin Flock, RN  01/21/2019 10:23 AM

## 2019-01-21 NOTE — Patient Instructions (Addendum)
Medication Instructions:  Your physician recommends that you continue on your current medications as directed. Please refer to the Current Medication list given to you today.  If you need a refill on your cardiac medications before your next appointment, please call your pharmacy.   Lab work: On 4/28: Fasting, BMET, Lipid and Liver  If you have labs (blood work) drawn today and your tests are completely normal, you will receive your results only by: Marland Kitchen MyChart Message (if you have MyChart) OR . A paper copy in the mail If you have any lab test that is abnormal or we need to change your treatment, we will call you to review the results.  Testing/Procedures: None  Follow-Up:  Nurse Visit Scheduled on 4/28 at 9:30 for Blood Pressure Check  Keep scheduled follow up with Dr. Mayford Knife in July.

## 2019-01-25 ENCOUNTER — Other Ambulatory Visit: Payer: Medicaid Other

## 2019-01-25 ENCOUNTER — Ambulatory Visit (INDEPENDENT_AMBULATORY_CARE_PROVIDER_SITE_OTHER): Payer: Medicaid Other

## 2019-01-25 ENCOUNTER — Other Ambulatory Visit: Payer: Self-pay

## 2019-01-25 VITALS — BP 136/82 | Ht 76.0 in | Wt 193.2 lb

## 2019-01-25 DIAGNOSIS — I10 Essential (primary) hypertension: Secondary | ICD-10-CM

## 2019-01-25 DIAGNOSIS — I2510 Atherosclerotic heart disease of native coronary artery without angina pectoris: Secondary | ICD-10-CM

## 2019-01-25 LAB — BASIC METABOLIC PANEL
BUN/Creatinine Ratio: 12 (ref 10–24)
BUN: 11 mg/dL (ref 8–27)
CO2: 23 mmol/L (ref 20–29)
Calcium: 9.8 mg/dL (ref 8.6–10.2)
Chloride: 102 mmol/L (ref 96–106)
Creatinine, Ser: 0.92 mg/dL (ref 0.76–1.27)
GFR calc Af Amer: 103 mL/min/{1.73_m2} (ref >59)
GFR calc non Af Amer: 89 mL/min/{1.73_m2} (ref >59)
Glucose: 102 mg/dL — ABNORMAL HIGH (ref 65–99)
Potassium: 4.6 mmol/L (ref 3.5–5.2)
Sodium: 138 mmol/L (ref 134–144)

## 2019-01-25 LAB — HEPATIC FUNCTION PANEL
ALT: 33 IU/L (ref 0–44)
AST: 22 IU/L (ref 0–40)
Albumin: 4.5 g/dL (ref 3.8–4.8)
Alkaline Phosphatase: 134 IU/L — ABNORMAL HIGH (ref 39–117)
Bilirubin Total: 0.6 mg/dL (ref 0.0–1.2)
Bilirubin, Direct: 0.2 mg/dL (ref 0.00–0.40)
Total Protein: 7.4 g/dL (ref 6.0–8.5)

## 2019-01-25 LAB — LIPID PANEL
Chol/HDL Ratio: 1.9 ratio (ref 0.0–5.0)
Cholesterol, Total: 139 mg/dL (ref 100–199)
HDL: 72 mg/dL (ref >39)
LDL Calculated: 49 mg/dL (ref 0–99)
Triglycerides: 92 mg/dL (ref 0–149)
VLDL Cholesterol Cal: 18 mg/dL (ref 5–40)

## 2019-01-25 NOTE — Progress Notes (Signed)
1.) Reason for visit: Blood Pressure Check  2.) Name of MD requesting visit: Dr. Mayford Knife  3.) H&P: HTN  4.) Assessment and plan per MD: The patient is doing well since adjusting carvedilol. Dr. Mayford Knife reviewed and agreed to continue current therapy.

## 2019-01-28 ENCOUNTER — Telehealth: Payer: Self-pay

## 2019-01-28 MED ORDER — AMLODIPINE BESYLATE 10 MG PO TABS: 10.0000 mg | ORAL_TABLET | Freq: Every day | ORAL | 3 refills | Status: DC

## 2019-01-28 NOTE — Telephone Encounter (Signed)
The patient has been notified of the result and verbalized understanding.  All questions (if any) were answered. He requested a refill for amlodipine.  Dustin Flock, RN 01/28/2019 1:39 PM

## 2019-01-28 NOTE — Telephone Encounter (Signed)
-----  Message from Sueanne Margarita, MD sent at 01/25/2019  4:08 PM EDT ----- Lipids at goal continue current therapy and forward to PCP for evaluation of elevated alk phos

## 2019-02-02 ENCOUNTER — Telehealth: Payer: Self-pay | Admitting: Cardiology

## 2019-02-02 MED ORDER — ATORVASTATIN CALCIUM 20 MG PO TABS: 20.0000 mg | ORAL_TABLET | Freq: Every day | ORAL | 3 refills | Status: DC

## 2019-02-02 MED ORDER — LISINOPRIL 5 MG PO TABS: 5.0000 mg | ORAL_TABLET | Freq: Every day | ORAL | 3 refills | Status: DC

## 2019-02-02 NOTE — Telephone Encounter (Signed)
OK to refill

## 2019-02-02 NOTE — Telephone Encounter (Signed)
Pt calling requesting a refill on atorvastatin and lisinopril. These medications were prescribed in the hospital. Would Dr. Mayford Knife like to refill these medications? Please address

## 2019-02-02 NOTE — Telephone Encounter (Signed)
Pt's medication was sent to pt's pharmacy as requested. Confirmation received.  °

## 2019-04-05 ENCOUNTER — Other Ambulatory Visit: Payer: Self-pay

## 2019-04-05 ENCOUNTER — Encounter: Payer: Self-pay | Admitting: Nurse Practitioner

## 2019-04-05 ENCOUNTER — Ambulatory Visit (INDEPENDENT_AMBULATORY_CARE_PROVIDER_SITE_OTHER): Payer: Medicaid Other | Admitting: Nurse Practitioner

## 2019-04-05 VITALS — BP 138/85 | HR 64 | Temp 98.9°F | Ht 76.0 in | Wt 203.0 lb

## 2019-04-05 DIAGNOSIS — L989 Disorder of the skin and subcutaneous tissue, unspecified: Secondary | ICD-10-CM

## 2019-04-05 DIAGNOSIS — L910 Hypertrophic scar: Secondary | ICD-10-CM | POA: Diagnosis not present
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
## 2019-04-15 ENCOUNTER — Telehealth: Payer: Self-pay | Admitting: Cardiology

## 2019-04-15 NOTE — Telephone Encounter (Signed)
New Message ° ° ° °Left message to confirm appt and answer covid questions  °

## 2019-04-18 ENCOUNTER — Ambulatory Visit: Payer: Medicaid Other | Admitting: Cardiology
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
## 2019-04-22 ENCOUNTER — Other Ambulatory Visit: Payer: Self-pay

## 2019-04-22 ENCOUNTER — Ambulatory Visit (INDEPENDENT_AMBULATORY_CARE_PROVIDER_SITE_OTHER): Payer: Medicaid Other | Admitting: Surgical

## 2019-04-22 ENCOUNTER — Encounter: Payer: Self-pay | Admitting: Surgical

## 2019-04-22 VITALS — BP 133/76 | HR 59 | Temp 97.5°F | Ht 76.0 in | Wt 201.4 lb

## 2019-04-22 DIAGNOSIS — L910 Hypertrophic scar: Secondary | ICD-10-CM

## 2019-04-22 DIAGNOSIS — L989 Disorder of the skin and subcutaneous tissue, unspecified: Secondary | ICD-10-CM

## 2019-06-27 ENCOUNTER — Telehealth: Payer: Self-pay | Admitting: Cardiology

## 2019-06-27 NOTE — Telephone Encounter (Signed)
New Message    *STAT* If patient is at the pharmacy, call can be transferred to refill team.   1. Which medications need to be refilled? (please list name of each medication and dose if known) Amlodipine 10mg , Atorvastatin 20mg , Carvedilol 3.125mg , Lisinopril 5mg , and acetaminophen 500mg   2. Which pharmacy/location (including street and city if local pharmacy) is medication to be sent to? [facility] on [address]  3. Do they need a 30 day or 90 day supply? 30 day supply

## 2019-06-28 NOTE — Telephone Encounter (Signed)
Pt has refills on all of these medications. I called the pt's pharmacy and requesting refills for the pt and the pharmacist is getting pt's medication refilled. Confirmation verbalized.

## 2019-08-02 ENCOUNTER — Ambulatory Visit: Payer: Medicaid Other | Admitting: Physician Assistant

## 2019-08-08 ENCOUNTER — Ambulatory Visit: Payer: Medicaid Other | Admitting: Cardiology

## 2019-08-15 ENCOUNTER — Encounter: Payer: Self-pay | Admitting: Cardiology

## 2019-08-16 ENCOUNTER — Ambulatory Visit: Payer: Medicaid Other | Admitting: Cardiology
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
## 2019-10-19 ENCOUNTER — Other Ambulatory Visit: Payer: Self-pay

## 2019-10-19 ENCOUNTER — Ambulatory Visit (INDEPENDENT_AMBULATORY_CARE_PROVIDER_SITE_OTHER): Payer: Medicaid Other | Admitting: Cardiology

## 2019-10-19 ENCOUNTER — Encounter: Payer: Self-pay | Admitting: Cardiology

## 2019-10-19 VITALS — BP 146/94 | HR 65 | Ht 76.0 in | Wt 201.6 lb

## 2019-10-19 DIAGNOSIS — I712 Thoracic aortic aneurysm, without rupture: Secondary | ICD-10-CM | POA: Diagnosis not present

## 2019-10-19 DIAGNOSIS — I2510 Atherosclerotic heart disease of native coronary artery without angina pectoris: Secondary | ICD-10-CM

## 2019-10-19 DIAGNOSIS — I7121 Aneurysm of the ascending aorta, without rupture: Secondary | ICD-10-CM

## 2019-10-19 DIAGNOSIS — I719 Aortic aneurysm of unspecified site, without rupture: Secondary | ICD-10-CM | POA: Diagnosis not present

## 2019-10-19 DIAGNOSIS — E7800 Pure hypercholesterolemia, unspecified: Secondary | ICD-10-CM

## 2019-10-19 DIAGNOSIS — I10 Essential (primary) hypertension: Secondary | ICD-10-CM

## 2019-10-19 MED ORDER — LISINOPRIL 10 MG PO TABS: 10.0000 mg | ORAL_TABLET | Freq: Every day | ORAL | 3 refills | Status: DC

## 2019-10-19 MED ORDER — METOPROLOL TARTRATE 100 MG PO TABS: 100.0000 mg | ORAL_TABLET | Freq: Once | ORAL | 0 refills | Status: DC

## 2019-10-19 NOTE — Patient Instructions (Addendum)
Medication Instructions:  Your physician has recommended you make the following change in your medication:  1) INCREASE lisinopril to 10 mg daily   *If you need a refill on your cardiac medications before your next appointment, please call your pharmacy*  Lab Work: CMET and Fasting lipid panel in one week. BMET prior to CT scan.    If you have labs (blood work) drawn today and your tests are completely normal, you will receive your results only by: Marland Kitchen MyChart Message (if you have MyChart) OR . A paper copy in the mail If you have any lab test that is abnormal or we need to change your treatment, we will call you to review the results.  Testing/Procedures: Your physician has requested that you have cardiac CT. Cardiac computed tomography (CT) is a painless test that uses an x-ray machine to take clear, detailed pictures of your heart. For further information please visit [email or web address] Please follow instruction sheet as given.  Follow-Up: At [facility], you and your health needs are our priority.  As part of our continuing mission to provide you with exceptional heart care, we have created designated Provider Care Teams.  These Care Teams include your primary Cardiologist (physician) and Advanced Practice Providers (APPs -  Physician Assistants and Nurse Practitioners) who all work together to provide you with the care you need, when you need it.  Your next appointment:   1 year(s)  The format for your next appointment:   In Person  Provider:   You may see Armanda Magic, MD or one of the following Advanced Practice Providers on your designated Care Team:    Ronie Spies, PA-C  Jacolyn Reedy, PA-C   Other Instructions  Your cardiac CT will be scheduled at one of the below locations:   [facility] [address] [place], [place] [address] [phone]  OR  [facility] [address] [place], [place]  [address] [phone]  If scheduled at [facility], please arrive at the [facility] main entrance of [facility] 30-45 minutes prior to test start time. Proceed to the [facility] Radiology Department (first floor) to check-in and test prep.  If scheduled at [facility], please arrive 15 mins early for check-in and test prep.  Please follow these instructions carefully (unless otherwise directed):  On the Night Before the Test: . Be sure to Drink plenty of water. . Do not consume any caffeinated/decaffeinated beverages or chocolate 12 hours prior to your test. . Do not take any antihistamines 12 hours prior to your test.  On the Day of the Test: . Drink plenty of water. Do not drink any water within one hour of the test. . Do not eat any food 4 hours prior to the test. . You may take your regular medications prior to the test.  . Take metoprolol (Lopressor) two hours prior to test.  After the Test: . Drink plenty of water. . After receiving IV contrast, you may experience a mild flushed feeling. This is normal. . On occasion, you may experience a mild rash up to 24 hours after the test. This is not dangerous. If this occurs, you can take Benadryl 25 mg and increase your fluid intake. . If you experience trouble breathing, this can be serious. If it is severe call 911 IMMEDIATELY. If it is mild, please call our office. . If you take any of these medications: Glipizide/Metformin, Avandament, Glucavance, please do not  take 48 hours after completing test unless otherwise instructed.   Once we have confirmed authorization from your insurance company, we will call you to set up a date and time for your test.   For non-scheduling related questions, please contact the cardiac imaging nurse navigator should you have any questions/concerns: Marchia Bond, RN Navigator Cardiac Imaging [facility] [phone] Office

## 2019-10-27 ENCOUNTER — Other Ambulatory Visit: Payer: Self-pay

## 2019-10-27 ENCOUNTER — Other Ambulatory Visit: Payer: Medicaid Other | Admitting: *Deleted

## 2019-10-27 DIAGNOSIS — I7121 Aneurysm of the ascending aorta, without rupture: Secondary | ICD-10-CM

## 2019-10-27 DIAGNOSIS — I712 Thoracic aortic aneurysm, without rupture: Secondary | ICD-10-CM

## 2019-10-27 DIAGNOSIS — I719 Aortic aneurysm of unspecified site, without rupture: Secondary | ICD-10-CM

## 2019-10-27 DIAGNOSIS — E7800 Pure hypercholesterolemia, unspecified: Secondary | ICD-10-CM

## 2019-10-27 DIAGNOSIS — I10 Essential (primary) hypertension: Secondary | ICD-10-CM

## 2019-10-27 DIAGNOSIS — I2510 Atherosclerotic heart disease of native coronary artery without angina pectoris: Secondary | ICD-10-CM

## 2019-10-27 LAB — COMPREHENSIVE METABOLIC PANEL
ALT: 38 IU/L (ref 0–44)
AST: 31 IU/L (ref 0–40)
Albumin/Globulin Ratio: 1.9 (ref 1.2–2.2)
Albumin: 3.8 g/dL (ref 3.8–4.8)
Alkaline Phosphatase: 96 IU/L (ref 39–117)
BUN/Creatinine Ratio: 16 (ref 10–24)
BUN: 18 mg/dL (ref 8–27)
Bilirubin Total: 0.5 mg/dL (ref 0.0–1.2)
CO2: 25 mmol/L (ref 20–29)
Calcium: 8.7 mg/dL (ref 8.6–10.2)
Chloride: 103 mmol/L (ref 96–106)
Creatinine, Ser: 1.13 mg/dL (ref 0.76–1.27)
GFR calc Af Amer: 80 mL/min/{1.73_m2} (ref >59)
GFR calc non Af Amer: 69 mL/min/{1.73_m2} (ref >59)
Globulin, Total: 2 g/dL (ref 1.5–4.5)
Glucose: 141 mg/dL — ABNORMAL HIGH (ref 65–99)
Potassium: 4.6 mmol/L (ref 3.5–5.2)
Sodium: 142 mmol/L (ref 134–144)
Total Protein: 5.8 g/dL — ABNORMAL LOW (ref 6.0–8.5)

## 2019-10-27 LAB — LIPID PANEL
Chol/HDL Ratio: 2.2 ratio (ref 0.0–5.0)
Cholesterol, Total: 137 mg/dL (ref 100–199)
HDL: 62 mg/dL (ref >39)
LDL Chol Calc (NIH): 53 mg/dL (ref 0–99)
Triglycerides: 124 mg/dL (ref 0–149)
VLDL Cholesterol Cal: 22 mg/dL (ref 5–40)

## 2020-02-13 ENCOUNTER — Other Ambulatory Visit: Payer: Self-pay

## 2020-02-13 ENCOUNTER — Emergency Department (HOSPITAL_COMMUNITY)
Admission: EM | Admit: 2020-02-13 | Discharge: 2020-02-13 | Disposition: A | Discharge status: Home / Self Care | Payer: Medicaid Other | Attending: Emergency Medicine | Admitting: Emergency Medicine

## 2020-02-13 ENCOUNTER — Encounter (HOSPITAL_COMMUNITY): Payer: Self-pay

## 2020-02-13 DIAGNOSIS — I10 Essential (primary) hypertension: Secondary | ICD-10-CM | POA: Insufficient documentation

## 2020-02-13 DIAGNOSIS — F17210 Nicotine dependence, cigarettes, uncomplicated: Secondary | ICD-10-CM | POA: Diagnosis not present

## 2020-02-13 DIAGNOSIS — T7840XA Allergy, unspecified, initial encounter: Secondary | ICD-10-CM | POA: Diagnosis present

## 2020-02-13 DIAGNOSIS — Z79899 Other long term (current) drug therapy: Secondary | ICD-10-CM | POA: Diagnosis not present

## 2020-02-13 DIAGNOSIS — T783XXA Angioneurotic edema, initial encounter: Secondary | ICD-10-CM | POA: Diagnosis not present

## 2020-02-13 DIAGNOSIS — I2510 Atherosclerotic heart disease of native coronary artery without angina pectoris: Secondary | ICD-10-CM | POA: Insufficient documentation

## 2020-02-13 MED ORDER — DEXAMETHASONE SODIUM PHOSPHATE 10 MG/ML IJ SOLN
10.0000 mg | Freq: Once | INTRAMUSCULAR | Status: AC
  Administered 2020-02-13: 10 mg via INTRAVENOUS
  Filled 2020-02-13: qty 1

## 2020-02-13 MED ORDER — DIPHENHYDRAMINE HCL 50 MG/ML IJ SOLN
25.0000 mg | Freq: Once | INTRAMUSCULAR | Status: AC
  Administered 2020-02-13: 25 mg via INTRAVENOUS
  Filled 2020-02-13: qty 1

## 2020-02-13 MED ORDER — EPINEPHRINE 0.3 MG/0.3ML IJ SOAJ
0.3000 mg | Freq: Once | INTRAMUSCULAR | Status: AC
  Administered 2020-02-13: 0.3 mg via INTRAMUSCULAR
  Filled 2020-02-13: qty 0.3

## 2020-02-13 MED ORDER — FENTANYL CITRATE (PF) 100 MCG/2ML IJ SOLN
50.0000 ug | Freq: Once | INTRAMUSCULAR | Status: AC
  Administered 2020-02-13: 50 ug via INTRAVENOUS
  Filled 2020-02-13: qty 2
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
## 2020-02-13 NOTE — Discharge Instructions (Addendum)
Please stop your LISINOPRIL for now.  Please follow-up with your cardiologist in 1 week to have your blood pressure reevaluated  If you develop new or worsening lip or tongue swelling, trouble breathing, trouble swallowing, or change in voice, or any other new/concerning symptoms and call 911 and/or return to the ER for evaluation.

## 2020-02-13 NOTE — ED Triage Notes (Signed)
Pt brought to ED from home via PT[facility] for possible allergic reaction. Pt states he was eating baked chicken from church about 3 hours ago and his face, lips, and tongue began to swell up. Pt reports accompanying nausea, dizziness. NKA. PTAR reports clear lung sounds, 96% on RA. Pt currently 96% on RA in ED, pulse 64, BP 151/93. Lips appear to be swollen. A&Ox4, NAD.

## 2020-02-13 NOTE — ED Provider Notes (Signed)
Care transferred to me.  Since I saw patient around 2 hours ago his lip swelling is about the same, may be improved.  Minimal to no tongue swelling.  He is able to eat crackers without difficulty.  Has no complaints of shortness of breath or trouble breathing/swallowing.  Probably his lisinopril and he has been instructed to stop this and call his cardiologist for change in BP meds.  Discharged home with return precautions.   Pricilla Loveless, MD 02/13/20 [phone]

## 2020-02-14 ENCOUNTER — Telehealth: Payer: Self-pay

## 2020-02-14 DIAGNOSIS — I10 Essential (primary) hypertension: Secondary | ICD-10-CM

## 2020-02-14 MED ORDER — CHLORTHALIDONE 25 MG PO TABS
25.0000 mg | ORAL_TABLET | Freq: Every day | ORAL | 3 refills | Status: DC
Start: 2020-02-14 — End: 2021-02-27

## 2020-02-14 NOTE — Telephone Encounter (Signed)
Garrett Reichert, MD  Theresia Majors, RN  Patient's Lisinopril ws stopped due to what sounds like angioedema from his ACE I. Please start Chlorthalidone 25mg  daily and followup with extender and BMET in 1 week.  Spoke with the patient and he will start taking chlorthalidone 25 mg daily. He will follow up with , NP on 05/26 and have BMET drawn the same day
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
## 2020-02-22 ENCOUNTER — Other Ambulatory Visit: Payer: Medicaid Other

## 2020-02-22 ENCOUNTER — Ambulatory Visit: Payer: Medicaid Other | Admitting: Nurse Practitioner

## 2020-03-04 IMAGING — CT CT ANGIO CHEST-ABD-PELV FOR DISSECTION W/ AND WO/W CM
2 of 10 series · 11 of 46 positions shown, 13 images · IV contrast (iopamidol)
Comparison: Chest CT - 11/16/2018

CLINICAL DATA: Follow-up thoracic aortic dissection

EXAM:
CT ANGIOGRAPHY CHEST, ABDOMEN AND PELVIS
TECHNIQUE: Multidetector CT imaging through the chest, abdomen and pelvis was
performed using the standard protocol during bolus administration of
intravenous contrast. Multiplanar reconstructed images and MIPs were
obtained and reviewed to evaluate the vascular anatomy.
CONTRAST:  100mL Q59H4I-VJ4 IOPAMIDOL ([id number]) INJECTION 76%

[Series 8: cor · coronal · 0.67mm/px · 2 of 151 slices shown]
[im 51/151  soft-tissue]
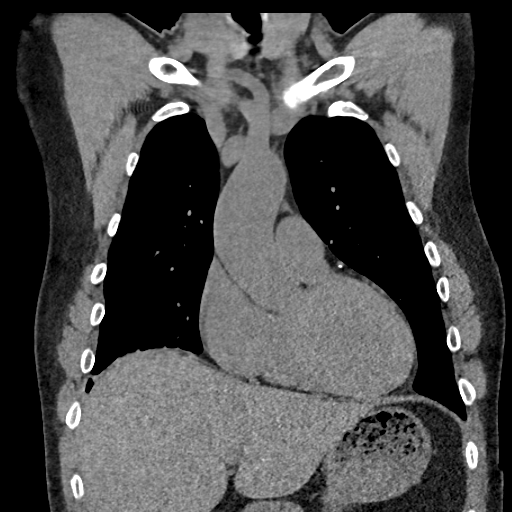
[im 101/151  soft-tissue]
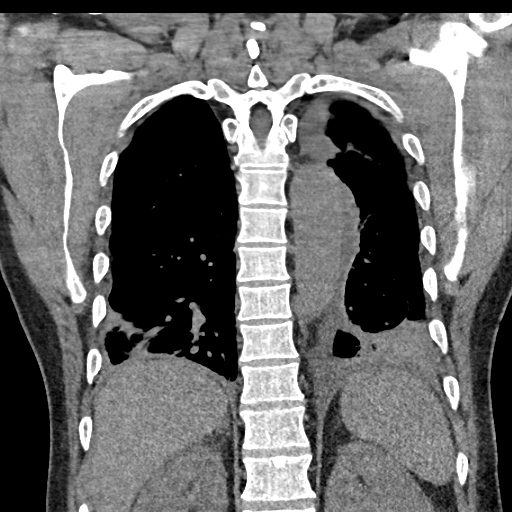

[Series 10: arterial · axial · arterial · 0.77mm/px · z∈[-722,-140]mm · 9 of 351 slices shown, 11 images]
[im 30/351  soft-tissue]
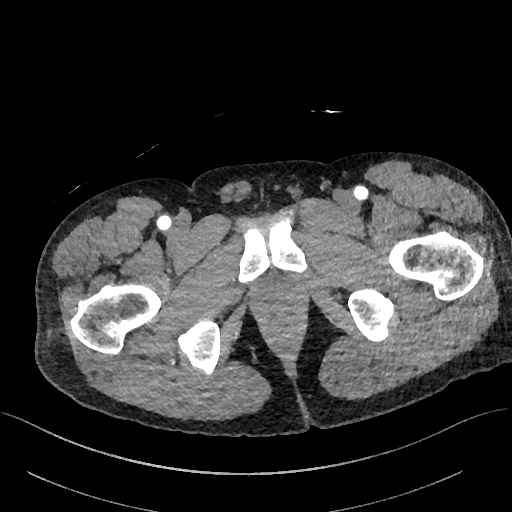
[im 30/351  bone]
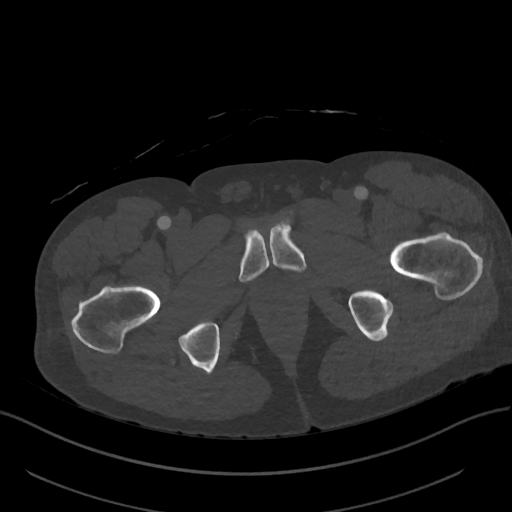
[im 59/351  soft-tissue]
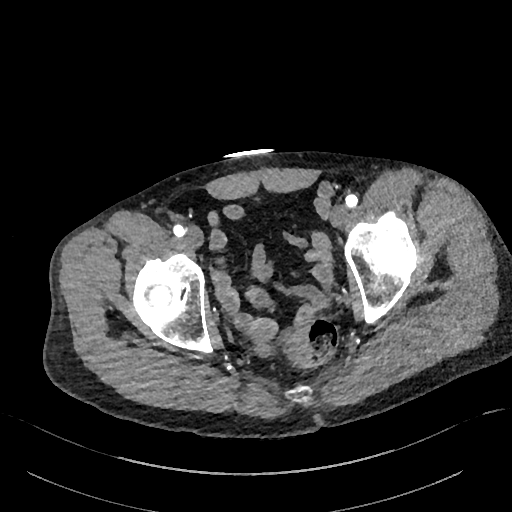
[im 117/351  soft-tissue]
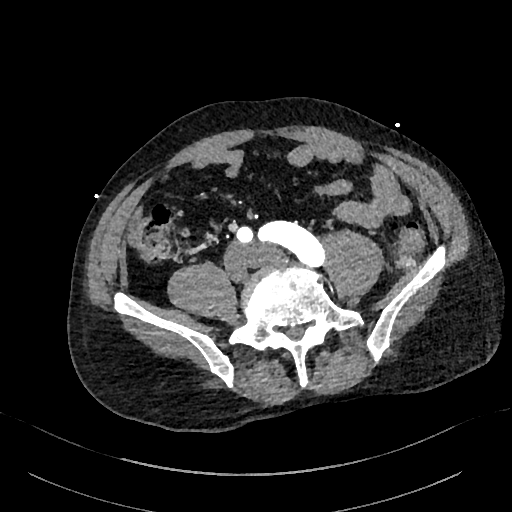
[im 146/351  soft-tissue]
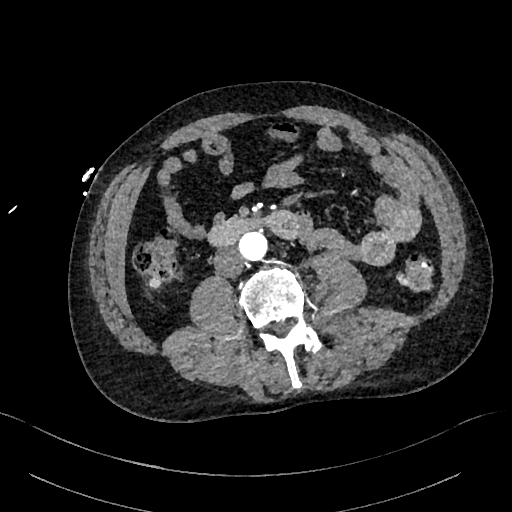
[im 176/351  soft-tissue]
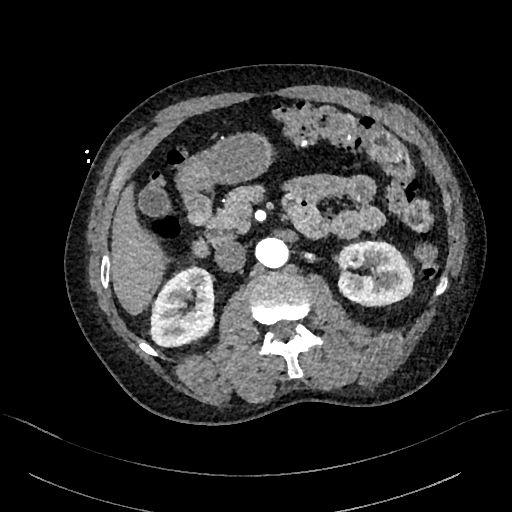
[im 205/351  soft-tissue]
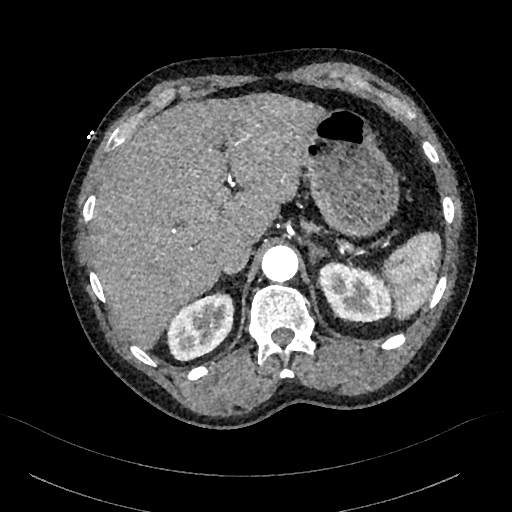
[im 234/351  soft-tissue]
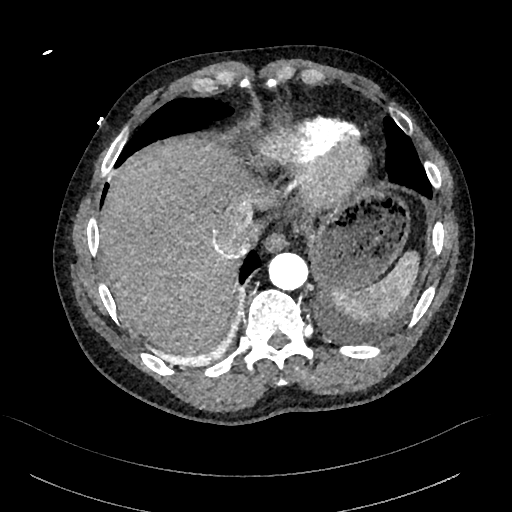
[im 292/351  soft-tissue]
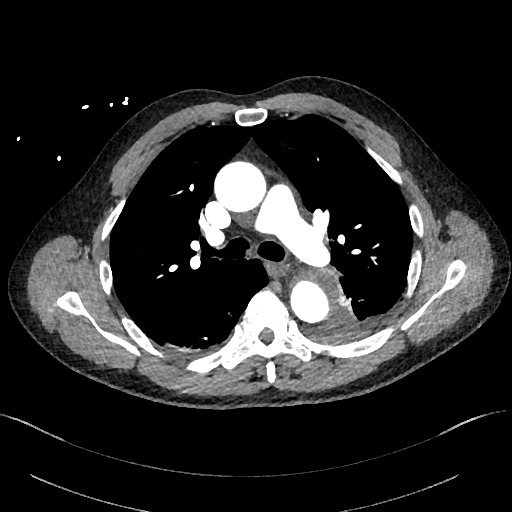
[im 321/351  soft-tissue]
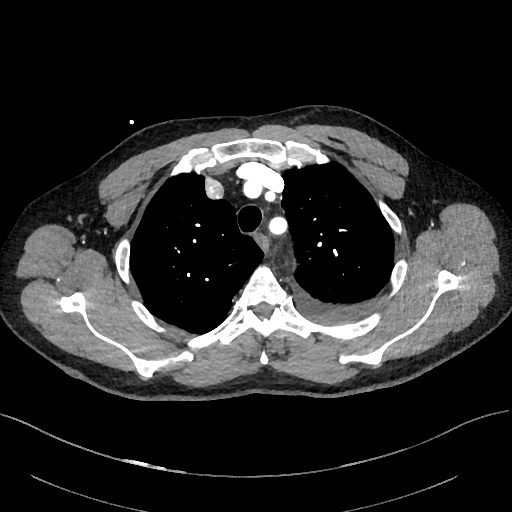
[im 321/351  bone]
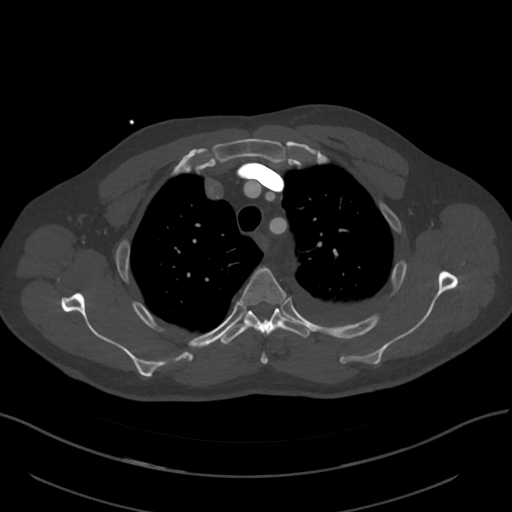

[11 of 46 positions shown; findings below may reference images not displayed]

FINDINGS: CTA CHEST FINDINGS

Vascular Findings:

Mild fusiform aneurysmal dilatation of the ascending thoracic aorta
with measurements as follows. Thoracic aorta tapers to a normal
caliber at the level of the aortic arch.

Interval enlargement of penetrating atherosclerotic ulcer involving
the posterior aspect of the aortic arch with persistent
opacification of the false lumen which now measures approximately
3.1 x 2.4 x 2.4 cm (axial image 84, series 10; coronal image 77,
series 13), previously, measuring approximately 2.8 x 0.9 x 1.9 cm.
The neck of the ulcer is grossly unchanged measuring approximately
1.1 cm in diameter (image 41, series 10), arising approximately 1 cm
peripheral to the takeoff of the left subclavian artery.

Review of the precontrast images demonstrates interval enlargement
of surrounding intramural hematoma about the penetrating ulcer
(images 49 through 64, series 5), beginning just peripheral to the
takeoff of the left subclavian artery (coronal image 74, series 8)
and extending to involve the proximal descending thoracic aorta
(coronal image 96, series 8). No discrete areas of contrast
extravasation.

The branch vessels of the aortic arch are widely patent throughout
their imaged courses with special attention paid to the left
subclavian artery..

The remainder of the mid and descending thoracic aorta are of normal
caliber.

Borderline cardiomegaly. No pericardial effusion. Coronary artery
calcifications.

Although this examination was not tailored for the evaluation the
pulmonary arteries, there are no discrete filling defects within the
central pulmonary arterial tree to suggest central pulmonary
embolism. Normal caliber the main pulmonary artery.

-------------------------------------------------------------

Thoracic aortic measurements:

Sinotubular junction

37 mm as measured in greatest oblique short axis coronal dimension.

Proximal ascending aorta

39 mm as measured in greatest oblique short axis axial dimension at
the level of the main pulmonary artery (axial image 63, series 10)
and approximately 40 mm in greatest oblique short axis coronal
diameter (coronal image 52, series 13).

Aortic arch aorta

32 mm as measured in greatest oblique short axis sagittal dimension
(sagittal image 92, series 14)..

Proximal descending thoracic aorta

28 mm as measured in greatest oblique short axis axial dimension at
the level of the main pulmonary artery.

Distal descending thoracic aorta

29 mm as measured in greatest oblique short axis axial dimension at
the level of the diaphragmatic hiatus.

Review of the MIP images confirms the above findings.

-------------------------------------------------------------

Non-Vascular Findings:

Mediastinum/Lymph Nodes: Scattered mediastinal and hilar lymph nodes
are not enlarged by size criteria and presumably reactive in
etiology. No bulky mediastinal, hilar axillary lymphadenopathy.
Normal appearance of the thyroid gland

Lungs/Pleura: Interval development of a small left-sided pleural
effusion with worsening subsegmental atelectasis within the
bilateral lung bases, left greater than right. No pneumothorax.
There is a minimal amount of nonocclusive debris seen within the
right mainstem bronchus (image 67, series 6). The remainder of the
central pulmonary airways appear widely patent. No discrete
pulmonary nodules.

Musculoskeletal: Mild (approximately 25%) compression deformity
involving the superior endplate of T12 without associated fracture
line. Bone island noted involving the superior endplate of T12.
Regional soft tissues appear normal.

_________________________________________________________

CTA ABDOMEN AND PELVIS FINDINGS

VASCULAR

Aorta: Mildly tortuous but normal caliber abdominal aorta. No
evidence of abdominal aortic dissection or periaortic stranding.

Celiac: Widely patent without a hemodynamically significant
narrowing. Conventional branching pattern.

SMA: Widely patent without hemodynamically significant narrowing.
Conventional branching pattern.

Renals: Duplicated bilaterally; tiny accessory renal arteries are
seen bilaterally, the right-sided which nearly shares in origin with
dominant right renal artery. The dominant bilateral renal arteries
are widely patent without hemodynamically significant narrowing. No
discrete areas of vessel irregularity to suggest FMD.

IMA: Remains patent.

Inflow: Fusiform aneurysmal dilatation of the bilateral common iliac
arteries with the left measuring 2.1 cm (image 241, series 10 and
the right measuring 1.8 cm (image 45, series 10). The bilateral
internal iliac arteries are of normal caliber and widely patent. The
bilateral external iliac arteries are mildly tortuous but of normal
caliber and widely patent.

Veins: The IVC and pelvic venous system appear widely patent on this
arterial phase examination.

Review of the MIP images confirms the above findings.

_________________________________________________________

NON-VASCULAR

Review the abdominal organs is limited to the arterial phase of
enhancement.

Hepatobiliary: Normal hepatic contour. No discrete hyperenhancing
hepatic lesions. Normal appearance of the gallbladder given degree
distention. No radiopaque gallstones. Common bile duct is mildly
dilated for age measuring 0.9 cm in diameter (coronal image 63,
series 13). No intrahepatic bili duct dilatation. No ascites.

Pancreas: Normal appearance of the pancreas.

Spleen: Normal appearance of the spleen.

Adrenals/Urinary Tract: There is symmetric enhancement of the
bilateral kidneys. No definite renal stones on this postcontrast
examination. No discrete renal lesions. No urinary obstruction.

Normal appearance the bilateral adrenal glands.

Normal appearance of the urinary bladder given degree distention.

Stomach/Bowel: Scattered colonic diverticulosis without evidence of
superimposed acute diverticulitis. Moderate colonic stool burden
without evidence of enteric obstruction. Normal appearance of the
terminal ileum and appendix. No pneumoperitoneum, pneumatosis or
portal venous gas.

Lymphatic: No bulky retroperitoneal, mesenteric, pelvic or inguinal
lymphadenopathy.

Reproductive: Normal appearance of the prostate gland. No free fluid
in the pelvic cul-de-sac.

Other: Tiny mesenteric fat containing periumbilical hernia.

Musculoskeletal: No acute or aggressive osseous abnormalities.
Moderate to severe multilevel lumbar spine DDD, worse at L3-L4 and
L4-L5 with disc space height loss, endplate irregularity and small
posteriorly directed disc osteophyte complexes at these locations.
Note is made of bilateral L5-S1 assimilation joint.

Review of the MIP images confirms the above findings.
IMPRESSION: Chest CTA impression:

1. Interval enlargement of contained penetrating atherosclerotic
ulcer involving the distal aspect of the aortic arch and associated
surrounding intramural hematoma. The neck of the ulcer originates
approximately 1 cm peripheral to the takeoff of the left subclavian
artery.
2. Stable uncomplicated fusiform aneurysmal dilatation of the
ascending thoracic aorta measuring 40 mm. Aortic aneurysm NOS
([id number]).
3. Borderline cardiomegaly.
4. Coronary artery calcifications.
5. Interval increase in size small left-sided pleural effusion and
associated worsening bibasilar opacities, left greater right, likely
atelectasis.

Abdomen and pelvic CTA impression:

1. Normal caliber of the abdominal aorta without evidence of
dissection.
2. Fusiform aneurysmal dilatation of the bilateral common iliac
arteries, the left measuring 2.1 cm and the right measuring 1.8 cm.
3. No acute findings within abdomen pelvis.

## 2020-03-12 ENCOUNTER — Other Ambulatory Visit: Payer: Self-pay

## 2020-03-12 ENCOUNTER — Encounter: Payer: Self-pay | Admitting: Plastic Surgery

## 2020-03-12 ENCOUNTER — Ambulatory Visit (INDEPENDENT_AMBULATORY_CARE_PROVIDER_SITE_OTHER): Payer: Medicaid Other | Admitting: Plastic Surgery

## 2020-03-12 VITALS — BP 161/93 | HR 72 | Temp 96.8°F | Ht 76.0 in | Wt 202.0 lb

## 2020-03-12 DIAGNOSIS — L910 Hypertrophic scar: Secondary | ICD-10-CM | POA: Diagnosis not present

## 2020-03-12 NOTE — Progress Notes (Signed)
   Subjective:    Patient ID: Garrett Fleming, male    DOB: 1956/07/20, 64 y.o.   MRN: [id number]  The patient is a 64 year old black male here for further evaluation of a right posterior shoulder keloid and a left posterior scalp changing skin lesion.  The patient states that the scalp lesion is getting the shoulder lesion is getting more painful and very itchy removed from his shoulder in the past.  He does not remember what it was.  He is not sure about his scalp.  He had a recent stent placed and is doing great since.     Review of Systems  Constitutional: Negative.   HENT: Negative.   Eyes: Negative.   Respiratory: Negative.   Cardiovascular: Negative.   Gastrointestinal: Negative.   Endocrine: Negative.   Genitourinary: Negative.   Musculoskeletal: Negative.   Skin: Positive for color change.  Hematological: Negative.   Psychiatric/Behavioral: Negative.        Objective:   Physical Exam Vitals and nursing note reviewed.  Constitutional:      Appearance: Normal appearance.  HENT:     Head: Normocephalic and atraumatic.   Cardiovascular:     Rate and Rhythm: Normal rate.     Pulses: Normal pulses.  Pulmonary:     Effort: Pulmonary effort is normal.  Musculoskeletal:       Arms:  Neurological:     General: No focal deficit present.     Mental Status: He is alert. Mental status is at baseline.  Psychiatric:        Mood and Affect: Mood normal.        Behavior: Behavior normal.        Thought Content: Thought content normal.        Assessment & Plan:     ICD-10-CM   1. Keloid  L91.0     Recommend Kenalog injection to right posterior shoulder keloid.  Recommend excision of scalp lesion.  We will try to save the patient a trip since he has to take the bus.  We can do the Kenalog injection in the OR.  Pictures were obtained of the patient and placed in the chart with the patient's or guardian's permission.
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
## 2020-03-20 ENCOUNTER — Encounter: Payer: Self-pay | Admitting: Nurse Practitioner

## 2020-03-20 ENCOUNTER — Other Ambulatory Visit: Payer: Medicaid Other

## 2020-03-20 ENCOUNTER — Other Ambulatory Visit: Payer: Self-pay

## 2020-03-20 ENCOUNTER — Ambulatory Visit: Payer: Medicaid Other | Admitting: Nurse Practitioner

## 2020-03-20 VITALS — BP 122/80 | HR 64 | Ht 76.0 in | Wt 211.1 lb

## 2020-03-20 DIAGNOSIS — E7800 Pure hypercholesterolemia, unspecified: Secondary | ICD-10-CM

## 2020-03-20 DIAGNOSIS — I10 Essential (primary) hypertension: Secondary | ICD-10-CM

## 2020-03-20 DIAGNOSIS — I719 Aortic aneurysm of unspecified site, without rupture: Secondary | ICD-10-CM

## 2020-03-20 DIAGNOSIS — I2510 Atherosclerotic heart disease of native coronary artery without angina pectoris: Secondary | ICD-10-CM | POA: Diagnosis not present

## 2020-03-20 DIAGNOSIS — I712 Thoracic aortic aneurysm, without rupture, unspecified: Secondary | ICD-10-CM

## 2020-03-20 DIAGNOSIS — I259 Chronic ischemic heart disease, unspecified: Secondary | ICD-10-CM

## 2020-03-20 MED ORDER — AMLODIPINE BESYLATE 10 MG PO TABS: 10.0000 mg | ORAL_TABLET | Freq: Every day | ORAL | 3 refills | Status: DC

## 2020-03-20 MED ORDER — CARVEDILOL 3.125 MG PO TABS: 3.1250 mg | ORAL_TABLET | Freq: Two times a day (BID) | ORAL | 3 refills | Status: DC

## 2020-03-20 MED ORDER — ATORVASTATIN CALCIUM 20 MG PO TABS: 20.0000 mg | ORAL_TABLET | Freq: Every day | ORAL | 3 refills | Status: DC

## 2020-03-20 NOTE — Patient Instructions (Addendum)
After Visit Summary:  We will be checking the following labs today - BMET, CBC, HPF and Lipids   Medication Instructions:    Continue with your current medicines.   I sent in 3 refills for you today.    If you need a refill on your cardiac medications before your next appointment, please call your pharmacy.     Testing/Procedures To Be Arranged:  Coronary CT  CTA of the aorta  Follow-Up:   See Dr. Turner in 4 months  We will get you a visit back to [facility] with Dr. Brabham.     At [facility], you and your health needs are our priority.  As part of our continuing mission to provide you with exceptional heart care, we have created designated Provider Care Teams.  These Care Teams include your primary Cardiologist (physician) and Advanced Practice Providers (APPs -  Physician Assistants and Nurse Practitioners) who all work together to provide you with the care you need, when you need it.  Special Instructions:  . Stay safe, wash your hands for at least 20 seconds and wear a mask when needed.  . It was good to talk with you today.   Your cardiac CT will be scheduled at one of the below locations:   [facility] [address] [place], [place] [address] [phone]  If scheduled at [facility], please arrive at the North Tower main entrance of [facility] 30 minutes prior to test start time. Proceed to the  Radiology Department (first floor) to check-in and test prep.   Please follow these instructions carefully (unless otherwise directed):  Hold all erectile dysfunction medications at least 3 days (72 hrs) prior to test.  On the Night Before the Test: . Be sure to Drink plenty of water. . Do not consume any caffeinated/decaffeinated beverages or chocolate 12 hours prior to your test. . Do not take any antihistamines 12 hours prior to your test. .   On the Day of the Test: . Drink plenty of water. Do not drink any water  within one hour of the test. . Do not eat any food 4 hours prior to the test. . You may take your regular medications prior to the test. BUT . Hold the Chlorthalidone the day of your CT scan       After the Test: . Drink plenty of water. . After receiving IV contrast, you may experience a mild flushed feeling. This is normal. . On occasion, you may experience a mild rash up to 24 hours after the test. This is not dangerous. If this occurs, you can take Benadryl 25 mg and increase your fluid intake. . If you experience trouble breathing, this can be serious. If it is severe call [phone] IMMEDIATELY. If it is mild, please call our office.   Once we have confirmed authorization from your insurance company, we will call you to set up a date and time for your test.   For non-scheduling related questions, please contact the cardiac imaging nurse navigator should you have any questions/concerns: Sara Wallace, Cardiac Imaging Nurse Navigator Merle Tai, Interim Cardiac Imaging Nurse Navigator  [facility] Direct Office Dial: [phone]   For scheduling needs, including cancellations and rescheduling, please call [phone].      Call the [facility] office at [phone] if you have any questions, problems or concerns.      

## 2020-03-21 LAB — BASIC METABOLIC PANEL
BUN/Creatinine Ratio: 17 (ref 10–24)
BUN: 18 mg/dL (ref 8–27)
CO2: 25 mmol/L (ref 20–29)
Calcium: 9.4 mg/dL (ref 8.6–10.2)
Chloride: 104 mmol/L (ref 96–106)
Creatinine, Ser: 1.03 mg/dL (ref 0.76–1.27)
GFR calc Af Amer: 90 mL/min/{1.73_m2} (ref >59)
GFR calc non Af Amer: 77 mL/min/{1.73_m2} (ref >59)
Glucose: 101 mg/dL — ABNORMAL HIGH (ref 65–99)
Potassium: 4.4 mmol/L (ref 3.5–5.2)
Sodium: 142 mmol/L (ref 134–144)

## 2020-03-21 LAB — HEPATIC FUNCTION PANEL
ALT: 25 IU/L (ref 0–44)
AST: 22 IU/L (ref 0–40)
Albumin: 4.3 g/dL (ref 3.8–4.8)
Alkaline Phosphatase: 118 IU/L (ref 48–121)
Bilirubin Total: 0.5 mg/dL (ref 0.0–1.2)
Bilirubin, Direct: 0.17 mg/dL (ref 0.00–0.40)
Total Protein: 6.7 g/dL (ref 6.0–8.5)

## 2020-03-21 LAB — LIPID PANEL
Chol/HDL Ratio: 2.1 ratio (ref 0.0–5.0)
Cholesterol, Total: 168 mg/dL (ref 100–199)
HDL: 80 mg/dL (ref >39)
LDL Chol Calc (NIH): 58 mg/dL (ref 0–99)
Triglycerides: 189 mg/dL — ABNORMAL HIGH (ref 0–149)
VLDL Cholesterol Cal: 30 mg/dL (ref 5–40)

## 2020-03-21 LAB — CBC
Hematocrit: 42.8 % (ref 37.5–51.0)
Hemoglobin: 14.5 g/dL (ref 13.0–17.7)
MCH: 33.1 pg — ABNORMAL HIGH (ref 26.6–33.0)
MCHC: 33.9 g/dL (ref 31.5–35.7)
MCV: 98 fL — ABNORMAL HIGH (ref 79–97)
Platelets: 257 10*3/uL (ref 150–450)
RBC: 4.38 x10E6/uL (ref 4.14–5.80)
RDW: 11.8 % (ref 11.6–15.4)
WBC: 6 10*3/uL (ref 3.4–10.8)

## 2020-03-28 ENCOUNTER — Telehealth (HOSPITAL_COMMUNITY): Payer: Self-pay | Admitting: *Deleted

## 2020-03-28 NOTE — Telephone Encounter (Signed)
Reaching out to patient to offer assistance regarding upcoming cardiac imaging study; pt's significant other verbalizes understanding of appt date/time, parking situation and where to check in, pre-test NPO status and medications ordered, and verified current allergies; name and call back number provided for further questions should they arise  Burley Saver RN [facility] [phone] office [phone] cell

## 2020-03-29 ENCOUNTER — Telehealth (HOSPITAL_COMMUNITY): Payer: Self-pay | Admitting: *Deleted

## 2020-03-29 ENCOUNTER — Ambulatory Visit (HOSPITAL_COMMUNITY): Admission: RE | Admit: 2020-03-29 | Payer: Medicaid Other | Source: Ambulatory Visit

## 2020-03-29 NOTE — Telephone Encounter (Signed)
Pt's significant other called to say that pt is not feeling well today so he won't be able to make his 15:15 cardiac CT appointment scheduled for today (March 29, 2020).  She was informed that her number would be passed on to the scheduling team so the scan can be rescheduled. Radiology nurses and CT made aware.
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
## 2020-04-03 ENCOUNTER — Other Ambulatory Visit: Payer: Self-pay

## 2020-04-03 ENCOUNTER — Encounter: Payer: Self-pay | Admitting: Plastic Surgery

## 2020-04-03 ENCOUNTER — Ambulatory Visit (INDEPENDENT_AMBULATORY_CARE_PROVIDER_SITE_OTHER): Payer: Medicaid Other | Admitting: Plastic Surgery

## 2020-04-03 ENCOUNTER — Telehealth: Payer: Self-pay | Admitting: *Deleted

## 2020-04-03 VITALS — BP 151/89 | HR 63 | Temp 97.8°F | Ht 76.0 in | Wt 207.4 lb

## 2020-04-03 DIAGNOSIS — L989 Disorder of the skin and subcutaneous tissue, unspecified: Secondary | ICD-10-CM

## 2020-04-03 DIAGNOSIS — L910 Hypertrophic scar: Secondary | ICD-10-CM

## 2020-04-03 MED ORDER — HYDROCODONE-ACETAMINOPHEN 5-325 MG PO TABS: 1.0000 | ORAL_TABLET | Freq: Three times a day (TID) | ORAL | 0 refills | Status: AC | PRN

## 2020-04-03 MED ORDER — ONDANSETRON HCL 4 MG PO TABS: 4.0000 mg | ORAL_TABLET | Freq: Three times a day (TID) | ORAL | 0 refills | Status: DC | PRN

## 2020-04-03 MED ORDER — CEPHALEXIN 500 MG PO CAPS: 500.0000 mg | ORAL_CAPSULE | Freq: Four times a day (QID) | ORAL | 0 refills | Status: AC

## 2020-04-03 NOTE — Telephone Encounter (Signed)
Low risk procedure but requires general anesthesia. Seen by Norma Fredrickson recently who recommended a coronary CT prior to clearance. Awaiting scheduling of the coronary CT

## 2020-04-03 NOTE — Telephone Encounter (Signed)
Called and spoke with the patient Garrett Fleming. I asked the patient if someone from the scheduling department been in contact with him about getting scheduled for the Coronary CT. Patient stated that he was scheduled, he cancelled the appointment and that he has been trying to call to get another appointment. Patient stated that he would call Dr. Norris Cross office to see if someone can help him get scheduled again.

## 2020-04-03 NOTE — Telephone Encounter (Signed)
Called the requesting office of [facility] and spoke with Sharolyn Douglas to inform her that the patient Mr. Garrett Fleming is awaiting to be scheduled for a Coronary CT. She asked if the scan would be scheduled before the scheduled date of upcoming procedure. I let her know that I can't say for sure I do not schedule for that and that I will reach out to the patient to see if he has ben scheduled or if someone may have reached out to him to get him scheduled. She thank me for the call and that she will pass along the information.

## 2020-04-03 NOTE — Telephone Encounter (Signed)
   [facility] Pre-operative Risk Assessment    HEARTCARE STAFF: - Please ensure there is not already an duplicate clearance open for this procedure. - Under Visit Info/Reason for Call, type in Other and utilize the format Clearance MM/DD/YY or Clearance TBD. Do not use dashes or single digits. - If request is for dental extraction, please clarify the # of teeth to be extracted.  Request for surgical clearance:  1. What type of surgery is being performed? EXCISION OF SCALP LESION AND RIGHT SHOULDER KELOID INJECTION   2. When is this surgery scheduled? 04/18/20   3. What type of clearance is required (medical clearance vs. Pharmacy clearance to hold med vs. Both)? MEDICAL  4. Are there any medications that need to be held prior to surgery and how long? NONE LISTED   5. Practice name and name of physician performing surgery? [facility]; DR. Marla Roe AND Venetia Night YOUNG, PA-C   6. What is the office phone number? [phone]   7.   What is the office fax number? [phone]  8.   Anesthesia type (None, local, MAC, general) ? GENERAL   Julaine Hua 04/03/2020, 12:07 PM  _________________________________________________________________   (provider comments below)

## 2020-04-10 NOTE — Telephone Encounter (Signed)
Pt has CT appt scheduled 04/13/2020 2:30 PM CT CARDIAC

## 2020-04-12 ENCOUNTER — Telehealth (HOSPITAL_COMMUNITY): Payer: Self-pay | Admitting: *Deleted

## 2020-04-12 NOTE — Telephone Encounter (Signed)
Reaching out to patient to offer assistance regarding upcoming cardiac imaging study; pt verbalizes understanding of appt date/time, parking situation and where to check in, pre-test NPO status and medications ordered, and verified current allergies; name and call back number provided for further questions should they arise  Kerin Ransom Tai RN [place] [facility] [phone] office [phone] cell  Pt to take 174m metoprolol 2 hours prior to test if they are able to obtain medications.  Pt's significant other verbalized understanding that if pt takes metoprolol, then he would not take his other blood pressure medications.  If pt is unable to obtain the metoprolol, pt would take his carvedilol 2 hours prior to the scan.

## 2020-04-13 ENCOUNTER — Ambulatory Visit (HOSPITAL_COMMUNITY)
Admission: RE | Admit: 2020-04-13 | Discharge: 2020-04-13 | Disposition: A | Discharge status: Home / Self Care | Payer: Medicaid Other | Source: Ambulatory Visit | Attending: Nurse Practitioner | Admitting: Nurse Practitioner

## 2020-04-13 DIAGNOSIS — I259 Chronic ischemic heart disease, unspecified: Secondary | ICD-10-CM

## 2020-04-13 DIAGNOSIS — I712 Thoracic aortic aneurysm, without rupture, unspecified: Secondary | ICD-10-CM

## 2020-04-13 DIAGNOSIS — I719 Aortic aneurysm of unspecified site, without rupture: Secondary | ICD-10-CM | POA: Diagnosis present

## 2020-04-13 MED ORDER — NITROGLYCERIN 0.4 MG SL SUBL
0.8000 mg | SUBLINGUAL_TABLET | Freq: Once | SUBLINGUAL | Status: AC
  Administered 2020-04-13: 0.8 mg via SUBLINGUAL

## 2020-04-13 MED ORDER — NITROGLYCERIN 0.4 MG SL SUBL
SUBLINGUAL_TABLET | SUBLINGUAL | Status: AC
  Filled 2020-04-13: qty 2

## 2020-04-13 MED ORDER — IOHEXOL 350 MG/ML SOLN
80.0000 mL | Freq: Once | INTRAVENOUS | Status: AC | PRN
  Administered 2020-04-13: 80 mL via INTRAVENOUS

## 2020-04-13 NOTE — Telephone Encounter (Signed)
   Primary Cardiologist: Armanda Magic, MD  Chart reviewed as part of pre-operative protocol coverage. Patient recently seen by Norma Fredrickson NP who also assessed patient for pre-op evaluation at that visit. Per her excellent note, "will see what his studies show. He can do over 4 mets of activity but will need his studies completed as outlined." Coronary CTA planned for 04/13/20, to be reviewed further by [facility] for final input on preop decision upon completion. Per protocol, provider who saw patient in clinic should route final decision to surgeon (no further separate input needed from separate preop APP team at this time). Will remove from preop box.  Laurann Montana, PA-C 04/13/2020, 8:13 AM

## 2020-04-14 ENCOUNTER — Other Ambulatory Visit (HOSPITAL_COMMUNITY): Payer: Medicaid Other

## 2020-04-18 ENCOUNTER — Other Ambulatory Visit: Payer: Self-pay

## 2020-04-18 ENCOUNTER — Encounter: Payer: Self-pay | Admitting: Surgical

## 2020-04-18 ENCOUNTER — Encounter (HOSPITAL_BASED_OUTPATIENT_CLINIC_OR_DEPARTMENT_OTHER): Payer: Self-pay | Admitting: Plastic Surgery

## 2020-04-18 NOTE — Telephone Encounter (Signed)
   Primary Cardiologist: Armanda Magic, MD  Chart reviewed as part of pre-operative protocol coverage. Given past medical history and time since last visit, based on ACC/AHA guidelines, [facility] would be at acceptable risk for the planned procedure without further cardiovascular testing.   I will route this recommendation to the requesting party via Epic fax function and remove from pre-op pool.  Please call with questions.  Thomasene Ripple. Tishana Clinkenbeard NP-C    04/18/2020, 4:07 PM [facility] [address] Office [phone] Fax [phone]

## 2020-04-18 NOTE — Progress Notes (Signed)
Chart reviewed with Dr Tacy Dura, aware of recent CT coronary results. OK for [facility].

## 2020-04-18 NOTE — H&P (View-Only) (Signed)
Surgical Clearance has been received from [facility] for patient's upcoming surgery with Dr. Ulice Bold . Medications to hold prior to surgery: None.

## 2020-04-18 NOTE — Progress Notes (Signed)
Surgical Clearance has been received from [facility] for patient's upcoming surgery with Dr. Dillingham . Medications to hold prior to surgery: None.  

## 2020-04-18 NOTE — Telephone Encounter (Signed)
Matt with [facility] is calling to follow up in regards to the status of clearance for the procedure. Please return call to discuss at [phone].

## 2020-04-21 ENCOUNTER — Other Ambulatory Visit (HOSPITAL_COMMUNITY)
Admission: RE | Admit: 2020-04-21 | Discharge: 2020-04-21 | Disposition: A | Discharge status: Home / Self Care | Payer: Medicaid Other | Source: Ambulatory Visit | Attending: Plastic Surgery | Admitting: Plastic Surgery

## 2020-04-21 DIAGNOSIS — Z20822 Contact with and (suspected) exposure to covid-19: Secondary | ICD-10-CM | POA: Insufficient documentation

## 2020-04-21 DIAGNOSIS — Z01812 Encounter for preprocedural laboratory examination: Secondary | ICD-10-CM | POA: Diagnosis not present

## 2020-04-21 LAB — SARS CORONAVIRUS 2 (TAT 6-24 HRS): SARS Coronavirus 2: NEGATIVE

## 2020-04-25 ENCOUNTER — Ambulatory Visit (HOSPITAL_BASED_OUTPATIENT_CLINIC_OR_DEPARTMENT_OTHER)
Admission: RE | Admit: 2020-04-25 | Discharge: 2020-04-25 | Disposition: A | Discharge status: Home / Self Care | Payer: Medicaid Other | Attending: Plastic Surgery | Admitting: Plastic Surgery

## 2020-04-25 ENCOUNTER — Encounter (HOSPITAL_BASED_OUTPATIENT_CLINIC_OR_DEPARTMENT_OTHER): Payer: Self-pay | Admitting: Plastic Surgery

## 2020-04-25 ENCOUNTER — Ambulatory Visit (HOSPITAL_BASED_OUTPATIENT_CLINIC_OR_DEPARTMENT_OTHER): Payer: Medicaid Other | Admitting: Anesthesiology

## 2020-04-25 ENCOUNTER — Other Ambulatory Visit: Payer: Self-pay

## 2020-04-25 ENCOUNTER — Encounter (HOSPITAL_BASED_OUTPATIENT_CLINIC_OR_DEPARTMENT_OTHER)
Admission: RE | Disposition: A | Discharge status: Home / Self Care | Payer: Self-pay | Source: Home / Self Care | Attending: Plastic Surgery

## 2020-04-25 ENCOUNTER — Telehealth (INDEPENDENT_AMBULATORY_CARE_PROVIDER_SITE_OTHER): Payer: Medicaid Other | Admitting: Plastic Surgery

## 2020-04-25 DIAGNOSIS — I700 Atherosclerosis of aorta: Secondary | ICD-10-CM | POA: Diagnosis not present

## 2020-04-25 DIAGNOSIS — I712 Thoracic aortic aneurysm, without rupture: Secondary | ICD-10-CM | POA: Diagnosis not present

## 2020-04-25 DIAGNOSIS — F17210 Nicotine dependence, cigarettes, uncomplicated: Secondary | ICD-10-CM | POA: Diagnosis not present

## 2020-04-25 DIAGNOSIS — L910 Hypertrophic scar: Secondary | ICD-10-CM

## 2020-04-25 DIAGNOSIS — L918 Other hypertrophic disorders of the skin: Secondary | ICD-10-CM | POA: Insufficient documentation

## 2020-04-25 DIAGNOSIS — F17200 Nicotine dependence, unspecified, uncomplicated: Secondary | ICD-10-CM | POA: Insufficient documentation

## 2020-04-25 DIAGNOSIS — H5461 Unqualified visual loss, right eye, normal vision left eye: Secondary | ICD-10-CM | POA: Diagnosis not present

## 2020-04-25 DIAGNOSIS — Z79899 Other long term (current) drug therapy: Secondary | ICD-10-CM | POA: Diagnosis not present

## 2020-04-25 DIAGNOSIS — I10 Essential (primary) hypertension: Secondary | ICD-10-CM | POA: Insufficient documentation

## 2020-04-25 DIAGNOSIS — I2510 Atherosclerotic heart disease of native coronary artery without angina pectoris: Secondary | ICD-10-CM | POA: Insufficient documentation

## 2020-04-25 DIAGNOSIS — L989 Disorder of the skin and subcutaneous tissue, unspecified: Secondary | ICD-10-CM

## 2020-04-25 HISTORY — PX: LESION REMOVAL: SHX5196

## 2020-04-25 HISTORY — PX: KENALOG INJECTION: SHX5298

## 2020-04-25 LAB — BASIC METABOLIC PANEL
Anion gap: 11 (ref 5–15)
BUN: 19 mg/dL (ref 8–23)
CO2: 25 mmol/L (ref 22–32)
Calcium: 9.1 mg/dL (ref 8.9–10.3)
Chloride: 106 mmol/L (ref 98–111)
Creatinine, Ser: 1.05 mg/dL (ref 0.61–1.24)
GFR calc Af Amer: >60 mL/min (ref >60)
GFR calc non Af Amer: >60 mL/min (ref >60)
Glucose, Bld: 116 mg/dL — ABNORMAL HIGH (ref 70–99)
Potassium: 3.8 mmol/L (ref 3.5–5.1)
Sodium: 142 mmol/L (ref 135–145)

## 2020-04-25 SURGERY — WIDE EXCISION, LESION, UPPER EXTREMITY
Anesthesia: General | Site: Shoulder | Laterality: Right

## 2020-04-25 MED ORDER — HYDRALAZINE HCL 20 MG/ML IJ SOLN
INTRAMUSCULAR | Status: AC
  Filled 2020-04-25: qty 1

## 2020-04-25 MED ORDER — OXYCODONE HCL 5 MG PO TABS
ORAL_TABLET | ORAL | Status: AC
  Filled 2020-04-25: qty 1

## 2020-04-25 MED ORDER — EPHEDRINE 5 MG/ML INJ
INTRAVENOUS | Status: AC
  Filled 2020-04-25: qty 10

## 2020-04-25 MED ORDER — MORPHINE SULFATE (PF) 4 MG/ML IV SOLN: 2.0000 mg | INTRAVENOUS | Status: DC | PRN

## 2020-04-25 MED ORDER — LIDOCAINE 2% (20 MG/ML) 5 ML SYRINGE
INTRAMUSCULAR | Status: AC
  Filled 2020-04-25: qty 5

## 2020-04-25 MED ORDER — TRIAMCINOLONE ACETONIDE 40 MG/ML IJ SUSP
INTRAMUSCULAR | Status: AC
  Filled 2020-04-25: qty 5

## 2020-04-25 MED ORDER — OXYCODONE HCL 5 MG/5ML PO SOLN: 5.0000 mg | Freq: Once | ORAL | Status: AC | PRN

## 2020-04-25 MED ORDER — CEFAZOLIN SODIUM-DEXTROSE 2-4 GM/100ML-% IV SOLN
INTRAVENOUS | Status: AC
  Filled 2020-04-25: qty 100

## 2020-04-25 MED ORDER — FENTANYL CITRATE (PF) 100 MCG/2ML IJ SOLN
25.0000 ug | INTRAMUSCULAR | Status: DC | PRN
  Administered 2020-04-25 (×2): 50 ug via INTRAVENOUS

## 2020-04-25 MED ORDER — LIDOCAINE-EPINEPHRINE 1 %-1:100000 IJ SOLN
INTRAMUSCULAR | Status: DC | PRN
  Administered 2020-04-25: 4 mL via INTRADERMAL

## 2020-04-25 MED ORDER — CHLORHEXIDINE GLUCONATE CLOTH 2 % EX PADS: 6.0000 | MEDICATED_PAD | Freq: Once | CUTANEOUS | Status: DC

## 2020-04-25 MED ORDER — DIPHENHYDRAMINE HCL 50 MG/ML IJ SOLN
INTRAMUSCULAR | Status: AC
  Filled 2020-04-25: qty 1

## 2020-04-25 MED ORDER — ONDANSETRON HCL 4 MG/2ML IJ SOLN: 4.0000 mg | Freq: Once | INTRAMUSCULAR | Status: DC | PRN

## 2020-04-25 MED ORDER — FENTANYL CITRATE (PF) 100 MCG/2ML IJ SOLN
INTRAMUSCULAR | Status: DC | PRN
  Administered 2020-04-25 (×2): 50 ug via INTRAVENOUS

## 2020-04-25 MED ORDER — IBUPROFEN 600 MG PO TABS
600.0000 mg | ORAL_TABLET | Freq: Four times a day (QID) | ORAL | 0 refills | Status: DC | PRN
Start: 2020-04-25 — End: 2023-05-04

## 2020-04-25 MED ORDER — FENTANYL CITRATE (PF) 100 MCG/2ML IJ SOLN
INTRAMUSCULAR | Status: AC
  Filled 2020-04-25: qty 2

## 2020-04-25 MED ORDER — OXYCODONE HCL 5 MG PO TABS
5.0000 mg | ORAL_TABLET | Freq: Once | ORAL | Status: AC | PRN
  Administered 2020-04-25: 5 mg via ORAL

## 2020-04-25 MED ORDER — SODIUM CHLORIDE 0.9% FLUSH: 3.0000 mL | INTRAVENOUS | Status: DC | PRN

## 2020-04-25 MED ORDER — EPHEDRINE SULFATE 50 MG/ML IJ SOLN
INTRAMUSCULAR | Status: DC | PRN
Start: 2020-04-25 — End: 2020-04-25
  Administered 2020-04-25 (×2): 10 mg via INTRAVENOUS

## 2020-04-25 MED ORDER — MIDAZOLAM HCL 2 MG/2ML IJ SOLN
INTRAMUSCULAR | Status: AC
  Filled 2020-04-25: qty 2

## 2020-04-25 MED ORDER — CEFAZOLIN SODIUM-DEXTROSE 2-4 GM/100ML-% IV SOLN
2.0000 g | INTRAVENOUS | Status: AC
  Administered 2020-04-25: 2 g via INTRAVENOUS

## 2020-04-25 MED ORDER — LACTATED RINGERS IV SOLN: INTRAVENOUS | Status: DC

## 2020-04-25 MED ORDER — TRIAMCINOLONE ACETONIDE 40 MG/ML IJ SUSP
INTRAMUSCULAR | Status: DC | PRN
  Administered 2020-04-25: .4 mL via INTRAMUSCULAR

## 2020-04-25 MED ORDER — HYDRALAZINE HCL 20 MG/ML IJ SOLN
10.0000 mg | Freq: Once | INTRAMUSCULAR | Status: AC
  Administered 2020-04-25: 10 mg via INTRAVENOUS

## 2020-04-25 MED ORDER — MIDAZOLAM HCL 5 MG/5ML IJ SOLN
INTRAMUSCULAR | Status: DC | PRN
  Administered 2020-04-25: 2 mg via INTRAVENOUS

## 2020-04-25 MED ORDER — OXYCODONE HCL 5 MG PO TABS: 5.0000 mg | ORAL_TABLET | ORAL | Status: DC | PRN

## 2020-04-25 MED ORDER — PHENYLEPHRINE 40 MCG/ML (10ML) SYRINGE FOR IV PUSH (FOR BLOOD PRESSURE SUPPORT)
PREFILLED_SYRINGE | INTRAVENOUS | Status: AC
  Filled 2020-04-25: qty 10

## 2020-04-25 MED ORDER — HYDROCODONE-ACETAMINOPHEN 5-325 MG PO TABS: 1.0000 | ORAL_TABLET | Freq: Three times a day (TID) | ORAL | 0 refills | Status: AC | PRN

## 2020-04-25 MED ORDER — DEXAMETHASONE SODIUM PHOSPHATE 4 MG/ML IJ SOLN
INTRAMUSCULAR | Status: DC | PRN
  Administered 2020-04-25: 5 mg via INTRAVENOUS

## 2020-04-25 MED ORDER — CARVEDILOL 3.125 MG PO TABS
3.1250 mg | ORAL_TABLET | Freq: Once | ORAL | Status: AC
  Administered 2020-04-25: 3.125 mg via ORAL
  Filled 2020-04-25: qty 1

## 2020-04-25 MED ORDER — ONDANSETRON HCL 4 MG/2ML IJ SOLN
INTRAMUSCULAR | Status: AC
  Filled 2020-04-25: qty 2

## 2020-04-25 MED ORDER — ACETAMINOPHEN 325 MG RE SUPP: 650.0000 mg | RECTAL | Status: DC | PRN

## 2020-04-25 MED ORDER — SODIUM CHLORIDE 0.9% FLUSH: 3.0000 mL | Freq: Two times a day (BID) | INTRAVENOUS | Status: DC

## 2020-04-25 MED ORDER — ONDANSETRON HCL 4 MG/2ML IJ SOLN
INTRAMUSCULAR | Status: DC | PRN
  Administered 2020-04-25: 4 mg via INTRAVENOUS

## 2020-04-25 MED ORDER — PROPOFOL 10 MG/ML IV BOLUS
INTRAVENOUS | Status: DC | PRN
  Administered 2020-04-25: 150 mg via INTRAVENOUS

## 2020-04-25 MED ORDER — PROPOFOL 10 MG/ML IV BOLUS
INTRAVENOUS | Status: AC
  Filled 2020-04-25: qty 20

## 2020-04-25 MED ORDER — LIDOCAINE HCL (CARDIAC) PF 100 MG/5ML IV SOSY
PREFILLED_SYRINGE | INTRAVENOUS | Status: DC | PRN
  Administered 2020-04-25: 80 mg via INTRAVENOUS

## 2020-04-25 MED ORDER — DEXAMETHASONE SODIUM PHOSPHATE 10 MG/ML IJ SOLN
INTRAMUSCULAR | Status: AC
  Filled 2020-04-25: qty 1

## 2020-04-25 MED ORDER — SODIUM CHLORIDE 0.9 % IV SOLN: 250.0000 mL | INTRAVENOUS | Status: DC | PRN

## 2020-04-25 MED ORDER — ACETAMINOPHEN 325 MG PO TABS: 650.0000 mg | ORAL_TABLET | ORAL | Status: DC | PRN

## 2020-04-25 MED ORDER — SUCCINYLCHOLINE CHLORIDE 200 MG/10ML IV SOSY
PREFILLED_SYRINGE | INTRAVENOUS | Status: AC
  Filled 2020-04-25: qty 10

## 2020-04-25 SURGICAL SUPPLY — 60 items
ADH SKN CLS APL DERMABOND .7 (GAUZE/BANDAGES/DRESSINGS)
BLADE CLIPPER SURG (BLADE) IMPLANT
BLADE SURG 15 STRL LF DISP TIS (BLADE) ×2 IMPLANT
BLADE SURG 15 STRL SS (BLADE) ×4
BNDG CONFORM 2 STRL LF (GAUZE/BANDAGES/DRESSINGS) IMPLANT
BNDG ELASTIC 2X5.8 VLCR STR LF (GAUZE/BANDAGES/DRESSINGS) IMPLANT
CANISTER SUCT 1200ML W/VALVE (MISCELLANEOUS) IMPLANT
CLOSURE WOUND 1/2 X4 (GAUZE/BANDAGES/DRESSINGS)
CORD BIPOLAR FORCEPS 12FT (ELECTRODE) IMPLANT
COVER BACK TABLE 60X90IN (DRAPES) ×4 IMPLANT
COVER MAYO STAND STRL (DRAPES) ×4 IMPLANT
COVER WAND RF STERILE (DRAPES) IMPLANT
DECANTER SPIKE VIAL GLASS SM (MISCELLANEOUS) ×4 IMPLANT
DERMABOND ADVANCED (GAUZE/BANDAGES/DRESSINGS)
DERMABOND ADVANCED .7 DNX12 (GAUZE/BANDAGES/DRESSINGS) IMPLANT
DRAPE LAPAROTOMY 100X72 PEDS (DRAPES) IMPLANT
DRAPE U-SHAPE 76X120 STRL (DRAPES) IMPLANT
DRSG TEGADERM 2-3/8X2-3/4 SM (GAUZE/BANDAGES/DRESSINGS) IMPLANT
ELECT NDL BLADE 2-5/6 (NEEDLE) ×1 IMPLANT
ELECT NEEDLE BLADE 2-5/6 (NEEDLE) ×4 IMPLANT
ELECT REM PT RETURN 9FT ADLT (ELECTROSURGICAL)
ELECT REM PT RETURN 9FT PED (ELECTROSURGICAL)
ELECTRODE REM PT RETRN 9FT PED (ELECTROSURGICAL) IMPLANT
ELECTRODE REM PT RTRN 9FT ADLT (ELECTROSURGICAL) IMPLANT
GAUZE SPONGE 4X4 12PLY STRL LF (GAUZE/BANDAGES/DRESSINGS) IMPLANT
GAUZE XEROFORM 1X8 LF (GAUZE/BANDAGES/DRESSINGS) IMPLANT
GLOVE BIO SURGEON STRL SZ 6.5 (GLOVE) ×6 IMPLANT
GLOVE BIO SURGEONS STRL SZ 6.5 (GLOVE) ×2
GOWN STRL REUS W/ TWL LRG LVL3 (GOWN DISPOSABLE) ×4 IMPLANT
GOWN STRL REUS W/TWL LRG LVL3 (GOWN DISPOSABLE) ×8
NDL PRECISIONGLIDE 27X1.5 (NEEDLE) IMPLANT
NEEDLE HYPO 30GX1 BEV (NEEDLE) ×4 IMPLANT
NEEDLE PRECISIONGLIDE 27X1.5 (NEEDLE) IMPLANT
NS IRRIG 1000ML POUR BTL (IV SOLUTION) IMPLANT
PACK BASIN DAY SURGERY FS (CUSTOM PROCEDURE TRAY) ×4 IMPLANT
PENCIL SMOKE EVACUATOR (MISCELLANEOUS) IMPLANT
SHEET MEDIUM DRAPE 40X70 STRL (DRAPES) IMPLANT
SPONGE GAUZE 2X2 8PLY STER LF (GAUZE/BANDAGES/DRESSINGS)
SPONGE GAUZE 2X2 8PLY STRL LF (GAUZE/BANDAGES/DRESSINGS) IMPLANT
STRIP CLOSURE SKIN 1/2X4 (GAUZE/BANDAGES/DRESSINGS) IMPLANT
SUCTION FRAZIER HANDLE 10FR (MISCELLANEOUS)
SUCTION TUBE FRAZIER 10FR DISP (MISCELLANEOUS) IMPLANT
SUT MNCRL 6-0 UNDY P1 1X18 (SUTURE) IMPLANT
SUT MNCRL AB 4-0 PS2 18 (SUTURE) IMPLANT
SUT MON AB 5-0 P3 18 (SUTURE) IMPLANT
SUT MON AB 5-0 PS2 18 (SUTURE) IMPLANT
SUT MONOCRYL 6-0 P1 1X18 (SUTURE)
SUT PROLENE 5 0 P 3 (SUTURE) IMPLANT
SUT PROLENE 5 0 PS 2 (SUTURE) IMPLANT
SUT PROLENE 6 0 P 1 18 (SUTURE) IMPLANT
SUT VIC AB 5-0 P-3 18X BRD (SUTURE) IMPLANT
SUT VIC AB 5-0 P3 18 (SUTURE)
SUT VIC AB 5-0 PS2 18 (SUTURE) IMPLANT
SUT VICRYL 4-0 PS2 18IN ABS (SUTURE) IMPLANT
SYR BULB EAR ULCER 3OZ GRN STR (SYRINGE) IMPLANT
SYR CONTROL 10ML LL (SYRINGE) ×4 IMPLANT
TOWEL GREEN STERILE FF (TOWEL DISPOSABLE) ×4 IMPLANT
TRAY DSU PREP LF (CUSTOM PROCEDURE TRAY) ×4 IMPLANT
TUBE CONNECTING 20'X1/4 (TUBING)
TUBE CONNECTING 20X1/4 (TUBING) IMPLANT

## 2020-04-25 NOTE — Interval H&P Note (Signed)
History and Physical Interval Note:  04/25/2020 11:52 AM  Garrett Fleming  has presented today for surgery, with the diagnosis of keloid, changin skin lesion.  The various methods of treatment have been discussed with the patient and family. After consideration of risks, benefits and other options for treatment, the patient has consented to  Procedure(s) with comments: Excision of keloid/changing skin lesion of scalp (N/A) - 1 hour total, please kenalog injection to right shoulder keloid (Right) as a surgical intervention.  The patient's history has been reviewed, patient examined, no change in status, stable for surgery.  I have reviewed the patient's chart and labs.  Questions were answered to the patient's satisfaction.     Alena Bills [name]

## 2020-04-25 NOTE — Discharge Instructions (Addendum)
INSTRUCTIONS FOR AFTER SURGERY   You will likely have some questions about what to expect following your operation.  The following information will help you and your family understand what to expect when you are discharged from the hospital.  Following these guidelines will help ensure a smooth recovery and reduce risks of complications.  Postoperative instructions include information on: diet, wound care, medications and physical activity.  AFTER SURGERY Expect to go home after the procedure.  In some cases, you may need to spend one night in the hospital for observation.  DIET This surgery does not require a specific diet.  However, I have to mention that the healthier you eat the better your body can start healing. It is important to increasing your protein intake.  This means limiting the foods with added sugar.  Focus on fruits and vegetables and some meat.  Be sure to drink water.  If your urine is bright yellow, then it is concentrated, and you need to drink more water.  As a general rule after surgery, you should have 8 ounces of water every hour while awake.  If you find you are persistently nauseated or unable to take in liquids let us know.  NO TOBACCO USE or EXPOSURE.  This will slow your healing process and increase the risk of a wound.  WOUND CARE You can shower the day after surgery.  Use fragrance free soap.  Dial, Dove, [place] and Cetaphil are usually mild on the skin.  If you have steri-strips / tape directly attached to your skin leave them in place. It is OK to get these wet.  No baths, pools or hot tubs for two weeks. We close your incision to leave the smallest and best-looking scar. No ointment or creams on your incisions until given the go ahead.  Especially not Neosporin (Too many skin reactions with this one).  A few weeks after surgery you can use Mederma and start massaging the scar.  ACTIVITY No heavy lifting until cleared by the doctor.  It is OK to walk and climb stairs.  In fact, moving your legs is very important to decrease your risk of a blood clot.  It will also help keep you from getting deconditioned.  Every 1 to 2 hours get up and walk for 5 minutes. This will help with a quicker recovery back to normal.  Let pain be your guide so you don't do too much.  NO, you cannot do the spring cleaning and don't plan on taking care of anyone else.  This is your time for TLC.   WORK Everyone returns to work at different times. As a rough guide, most people take at least 1 - 2 weeks off prior to returning to work. If you need documentation for your job, bring the forms to your postoperative follow up visit.  DRIVING Arrange for someone to bring you home from the hospital.  You may be able to drive a few days after surgery but not while taking any narcotics or valium.  BOWEL MOVEMENTS Constipation can occur after anesthesia and while taking pain medication.  It is important to stay ahead for your comfort.  We recommend taking Milk of Magnesia (2 tablespoons; twice a day) while taking the pain pills.  SEROMA This is fluid your body tried to put in the surgical site.  This is normal but if it creates excessive pain and swelling let us know.  It usually decreases in a few weeks.  MEDICATIONS and PAIN CONTROL  At your preoperative visit for you history and physical you were given the following medications: 1. An antibiotic: Start this medication when you get home and take according to the instructions on the bottle. 2. Zofran 4 mg:  This is to treat nausea and vomiting.  You can take this every 6 hours as needed and only if needed. 3. Norco (hydrocodone/acetaminophen) 5/325 mg:  This is only to be used after you have taken the motrin or the tylenol. Every 8 hours as needed. Over the counter Medication to take: 4. Ibuprofen (Motrin) 600 mg:  Take this every 6 hours.  If you have additional pain then take 500 mg of the tylenol.  Only take the Norco after you have tried these  two. 5. Miralax or stool softener of choice: Take this according to the bottle if you take the Norco.  WHEN TO CALL Call your surgeon's office if any of the following occur:  Fever 101 degrees F or greater  Excessive bleeding or fluid from the incision site.  Pain that increases over time without aid from the medications  Redness, warmth, or pus draining from incision sites  Persistent nausea or inability to take in liquids  Severe misshapen area that underwent the operation.   Post Anesthesia Home Care Instructions  Activity: Get plenty of rest for the remainder of the day. A responsible individual must stay with you for 24 hours following the procedure.  For the next 24 hours, DO NOT: -Drive a car -Advertising copywriter -Drink alcoholic beverages -Take any medication unless instructed by your physician -Make any legal decisions or sign important papers.  Meals: Start with liquid foods such as gelatin or soup. Progress to regular foods as tolerated. Avoid greasy, spicy, heavy foods. If nausea and/or vomiting occur, drink only clear liquids until the nausea and/or vomiting subsides. Call your physician if vomiting continues.  Special Instructions/Symptoms: Your throat may feel dry or sore from the anesthesia or the breathing tube placed in your throat during surgery. If this causes discomfort, gargle with warm salt water. The discomfort should disappear within 24 hours.

## 2020-04-25 NOTE — Anesthesia Preprocedure Evaluation (Addendum)
Anesthesia Evaluation  Patient identified by MRN, date of birth, ID band Patient awake    Reviewed: Allergy & Precautions, NPO status , Patient's Chart, lab work & pertinent test results, reviewed documented beta blocker date and time   History of Anesthesia Complications Negative for: history of anesthetic complications  Airway Mallampati: II  TM Distance: >3 FB Neck ROM: Full    Dental  (+) Dental Advisory Given, Chipped, Poor Dentition   Pulmonary Current Smoker and Patient abstained from smoking.,    Pulmonary exam normal        Cardiovascular hypertension, Pt. on medications and Pt. on home beta blockers + CAD  Normal cardiovascular exam   '20 TTE - EF 55-60%. LA was moderately dilated. AV sclerosis without any evidence of stenosis. There is mild dilatation of the aortic root measuring 39 mm.  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  Cardiac clearance, see note in chart    Neuro/Psych  Blindness of right eye  negative psych ROS   GI/Hepatic negative GI ROS, (+)     substance abuse  cocaine use,   Endo/Other  negative endocrine ROS  Renal/GU negative Renal ROS     Musculoskeletal  (+) Arthritis ,   Abdominal   Peds  Hematology negative hematology ROS (+)   Anesthesia Other Findings   Reproductive/Obstetrics                          Anesthesia Physical Anesthesia Plan  ASA: III  Anesthesia Plan: General   Post-op Pain Management:    Induction: Intravenous  PONV Risk Score and Plan: 2 and Treatment may vary due to age or medical condition, Ondansetron, Midazolam and Dexamethasone  Airway Management Planned: LMA  Additional Equipment: None  Intra-op Plan:   Post-operative Plan: Extubation in OR  Informed Consent: I have reviewed the patients History and Physical, chart, labs and discussed the procedure including the risks,  benefits and alternatives for the proposed anesthesia with the patient or authorized representative who has indicated his/her understanding and acceptance.     Dental advisory given  Plan Discussed with: CRNA and Anesthesiologist  Anesthesia Plan Comments:        Anesthesia Quick Evaluation

## 2020-04-25 NOTE — Telephone Encounter (Signed)
Patient's fiance, [name], called to request pain medication for Mr. Raul Del. He cannot tolerate tylenol well. Please call her to advise. [phone]

## 2020-04-25 NOTE — Transfer of Care (Signed)
Immediate Anesthesia Transfer of Care Note  Patient: Garrett Fleming  Procedure(s) Performed: Excision of keloid/changing skin lesion of scalp (Left Scalp) kenalog injection to right shoulder keloid (Right Shoulder)  Patient Location: PACU  Anesthesia Type:General  Level of Consciousness: sedated  Airway & Oxygen Therapy: Patient Spontanous Breathing and Patient connected to face mask oxygen  Post-op Assessment: Report given to RN and Post -op Vital signs reviewed and stable  Post vital signs: Reviewed and stable  Last Vitals:  Vitals Value Taken Time  BP 158/85 04/25/20 1256  Temp    Pulse 54 04/25/20 1259  Resp 10 04/25/20 1259  SpO2 100 % 04/25/20 1259  Vitals shown include unvalidated device data.  Last Pain:  Vitals:   04/25/20 1004  TempSrc: Oral  PainSc: 0-No pain         Complications: No complications documented.

## 2020-04-25 NOTE — Anesthesia Procedure Notes (Signed)
Procedure Name: LMA Insertion Date/Time: 04/25/2020 12:26 PM Performed by: Burna Cash, CRNA Pre-anesthesia Checklist: Patient identified, Emergency Drugs available, Suction available and Patient being monitored Patient Re-evaluated:Patient Re-evaluated prior to induction Oxygen Delivery Method: Circle system utilized Preoxygenation: Pre-oxygenation with 100% oxygen Induction Type: IV induction Ventilation: Mask ventilation without difficulty LMA: LMA inserted LMA Size: 5.0 Number of attempts: 1 Airway Equipment and Method: Bite block Placement Confirmation: positive ETCO2 Tube secured with: Tape Dental Injury: Teeth and Oropharynx as per pre-operative assessment

## 2020-04-25 NOTE — Telephone Encounter (Signed)
Patient didn't pick up his preop medications. Resent Pain meds.

## 2020-04-25 NOTE — Op Note (Signed)
DATE OF OPERATION: 04/25/2020  LOCATION: [facility]  PREOPERATIVE DIAGNOSIS: Keloid of neck  POSTOPERATIVE DIAGNOSIS: Same  PROCEDURE: Excision of keloid of neck 1 x 2 cm, kenalog injection to 1 x 5 cm keloid of back  SURGEON: Jak Haggar Sanger Ayleen Mckinstry, DO  ASSISTANT: Joni Fears, PA  EBL: 5 cc  CONDITION: Stable  COMPLICATIONS: None  INDICATION: The patient, Garrett Fleming, is a 64 y.o. male born on 17-Sep-1956, is here for treatment of a neck keloid.   PROCEDURE DETAILS:  The patient was seen prior to surgery and marked.  The IV antibiotics were given. The patient was taken to the operating room and given a general anesthetic. A standard time out was performed and all information was confirmed by those in the room. SCDs were placed.   The patient was placed on the right lateral position.  The scalp was prepped and draped.  Local with epinephrine was injected for intraoperative hemostasis and postoperative pain control.   The #10 blade was used to make an incision in an ellipse to excise the 1 x 2 cm keloid.  A 4-0 Monocryl was used to close the skin edges.  The back keloid 1 x 5 cm was injected with 0.4 mg of kenalog 40 mg.  The patient was allowed to wake up and taken to recovery room in stable condition at the end of the case. The family was notified at the end of the case.   The advanced practice practitioner (APP) assisted throughout the case.  The APP was essential in retraction and counter traction when needed to make the case progress smoothly.  This retraction and assistance made it possible to see the tissue plans for the procedure.  The assistance was needed for blood control, tissue re-approximation and assisted with closure of the incision site.

## 2020-04-25 NOTE — Anesthesia Postprocedure Evaluation (Signed)
Anesthesia Post Note  Patient: Garrett Fleming  Procedure(s) Performed: Excision of keloid/changing skin lesion of scalp (Left Scalp) kenalog injection to right shoulder keloid (Right Shoulder)     Patient location during evaluation: PACU Anesthesia Type: General Level of consciousness: awake and alert Pain management: pain level controlled Vital Signs Assessment: post-procedure vital signs reviewed and stable Respiratory status: spontaneous breathing, nonlabored ventilation, respiratory function stable and patient connected to nasal cannula oxygen Cardiovascular status: blood pressure returned to baseline and stable Postop Assessment: no apparent nausea or vomiting Anesthetic complications: no   No complications documented.  Last Vitals:  Vitals:   04/25/20 1345 04/25/20 1405  BP: (!) 171/94 (!) 156/84  Pulse: 51 53  Resp: (!) 10 16  Temp:  36.6 C  SpO2: 100% 100%    Last Pain:  Vitals:   04/25/20 1405  TempSrc:   PainSc: 5                  Niccole Witthuhn S

## 2020-04-26 ENCOUNTER — Encounter (HOSPITAL_BASED_OUTPATIENT_CLINIC_OR_DEPARTMENT_OTHER): Payer: Self-pay | Admitting: Plastic Surgery

## 2020-04-26 LAB — SURGICAL PATHOLOGY

## 2020-05-01 ENCOUNTER — Encounter: Payer: Medicaid Other | Admitting: Plastic Surgery

## 2020-05-03 NOTE — Interval H&P Note (Signed)
History and Physical Interval Note:  05/03/2020 7:19 AM  Garrett Fleming  has presented today for surgery, with the diagnosis of keloid, changin skin lesion.  The various methods of treatment have been discussed with the patient and family. After consideration of risks, benefits and other options for treatment, the patient has consented to  Procedure(s) with comments: Excision of keloid/changing skin lesion of scalp (Left) - 1 hour total, please kenalog injection to right shoulder keloid (Right) as a surgical intervention.  The patient's history has been reviewed, patient examined, no change in status, stable for surgery.  I have reviewed the patient's chart and labs.  Questions were answered to the patient's satisfaction.     Alena Bills [name]

## 2020-05-07 NOTE — Progress Notes (Signed)
Subjective:     Patient ID: Garrett Fleming, male    DOB: [birth year], 64 y.o.   MRN: [id number]  Chief Complaint  Patient presents with  . Post-op Follow-up    HPI: The patient is a 64 y.o. male here for follow-up after undergoing excision of keloid of neck/scalp (1 x 2 cm) and Kenalog injection to 1 x 5 cm keloid of back on 04/25/2020 with Dr. Ulice Fleming.  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 Mild itching reported around posterior shoulder keloid.  Scalp incision is healing very nicely, C/D/I.  No signs of redness, drainage, infection, seroma/hematoma.  Keloid on posterior right shoulder appears to have reduced some in size.  No signs of infection, drainage.  Area around the keloid has some dryness from bandage adhesive.  Review of Systems  Constitutional: Negative for chills and fever.  HENT: Negative for congestion and sore throat.   Respiratory: Negative for cough and shortness of breath.   Cardiovascular: Negative for chest pain.  Gastrointestinal: Negative for abdominal pain, nausea and vomiting.  Skin: Negative for rash.     Objective:   Vital Signs BP (!) 158/88 (BP Location: Left Arm, Patient Position: Sitting, Cuff Size: Large)   Pulse 60   Temp 98.7 F (37.1 C) (Oral)   SpO2 97%  Vital Signs and Nursing Note Reviewed  Physical Exam Constitutional:      Appearance: Normal appearance. He is normal weight.  HENT:     Head: Normocephalic and atraumatic.      Comments: Incision healing very well, c/d/i.  No signs of infection, redness, drainage, seroma/hematoma.  Few sutures were removed today Cardiovascular:     Rate and Rhythm: Normal rate.  Pulmonary:     Effort: Pulmonary effort is normal.  Musculoskeletal:        General: Normal range of motion.     Cervical back: Normal range of motion.       Back:     Comments: Keloid appears to  have reduced some in size.  Some irritation redness and dryness surrounding the keloid from the dressing adhesive.  No signs of infection, drainage.  Skin:    General: Skin is warm and dry.     Coloration: Skin is not pale.     Findings: No erythema or rash.  Neurological:     Mental Status: He is alert and oriented to person, place, and time.     Gait: Gait is intact.  Psychiatric:        Mood and Affect: Mood and affect normal.        Cognition and Memory: Memory normal.        Judgment: Judgment normal.    Pictures were obtained of the patient and placed in the chart with the patient's or guardian's permission.    Assessment/Plan:     ICD-10-CM   1. Skin lesion of scalp  L98.9   2. Keloid  L91.0    Mr. Garrett Fleming is doing very well.  His scalp incision is healing nicely, C/D/I.  A few sutures were removed today.  Right posterior shoulder keloid has reduced some in size.  Some mild irritation redness and dryness on skin around the keloid from dressing adhesive.  May apply Vaseline to dry skin around keloid for moisture.  He does not have to keep the keloid covered at this time unless desired for comfort.  Follow-up as needed.  Call office with any questions/concerns.  Garrett Abrahams, PA-C 05/08/2020, 1:23 PM

## 2020-05-08 ENCOUNTER — Encounter: Payer: Self-pay | Admitting: Plastic Surgery

## 2020-05-08 ENCOUNTER — Other Ambulatory Visit: Payer: Self-pay

## 2020-05-08 ENCOUNTER — Ambulatory Visit (INDEPENDENT_AMBULATORY_CARE_PROVIDER_SITE_OTHER): Payer: Medicaid Other | Admitting: Plastic Surgery

## 2020-05-08 VITALS — BP 158/88 | HR 60 | Temp 98.7°F

## 2020-05-08 DIAGNOSIS — L989 Disorder of the skin and subcutaneous tissue, unspecified: Secondary | ICD-10-CM

## 2020-05-08 DIAGNOSIS — L910 Hypertrophic scar: Secondary | ICD-10-CM

## 2020-05-09 ENCOUNTER — Other Ambulatory Visit: Payer: Self-pay

## 2020-05-09 DIAGNOSIS — I712 Thoracic aortic aneurysm, without rupture, unspecified: Secondary | ICD-10-CM

## 2020-05-25 ENCOUNTER — Inpatient Hospital Stay: Admission: RE | Admit: 2020-05-25 | Payer: Medicaid Other | Source: Ambulatory Visit

## 2020-05-28 ENCOUNTER — Ambulatory Visit: Payer: Medicaid Other | Admitting: Surgery

## 2020-07-11 ENCOUNTER — Ambulatory Visit: Payer: Medicaid Other | Admitting: Cardiology

## 2020-07-16 ENCOUNTER — Ambulatory Visit: Payer: Medicaid Other | Admitting: Surgery

## 2020-08-03 ENCOUNTER — Ambulatory Visit: Payer: Medicaid Other | Admitting: Cardiology

## 2020-08-06 ENCOUNTER — Ambulatory Visit: Payer: Medicaid Other | Admitting: Surgery

## 2020-08-07 ENCOUNTER — Other Ambulatory Visit: Payer: Self-pay

## 2020-08-07 DIAGNOSIS — I712 Thoracic aortic aneurysm, without rupture, unspecified: Secondary | ICD-10-CM

## 2020-08-08 ENCOUNTER — Ambulatory Visit: Payer: Medicaid Other | Admitting: Cardiology

## 2020-09-11 ENCOUNTER — Ambulatory Visit: Payer: Medicaid Other | Admitting: Cardiology

## 2020-10-01 ENCOUNTER — Ambulatory Visit: Payer: Medicaid Other | Admitting: Surgery

## 2020-10-04 ENCOUNTER — Other Ambulatory Visit: Payer: Self-pay

## 2020-10-04 DIAGNOSIS — I712 Thoracic aortic aneurysm, without rupture, unspecified: Secondary | ICD-10-CM

## 2020-10-09 ENCOUNTER — Ambulatory Visit: Payer: Medicaid Other | Admitting: Cardiology

## 2020-10-26 ENCOUNTER — Ambulatory Visit
Admission: RE | Admit: 2020-10-26 | Discharge: 2020-10-26 | Disposition: A | Discharge status: Home / Self Care | Payer: Medicaid Other | Source: Ambulatory Visit | Attending: Surgery | Admitting: Surgery

## 2020-10-26 DIAGNOSIS — I712 Thoracic aortic aneurysm, without rupture, unspecified: Secondary | ICD-10-CM

## 2020-10-29 ENCOUNTER — Telehealth: Payer: Self-pay

## 2020-10-29 NOTE — Telephone Encounter (Addendum)
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  Per Dr. Myra Gianotti, CTA is needed in addition to the CTA chest (which patient already had previously).  Pt is status post TAA repair and possible preop planning.  Hopefully the patient can be rescheduled prior to his appointment on 11/05/20.

## 2020-11-05 ENCOUNTER — Ambulatory Visit: Payer: Medicaid Other | Admitting: Surgery

## 2020-11-05 ENCOUNTER — Telehealth: Payer: Self-pay

## 2020-11-05 NOTE — Telephone Encounter (Signed)
I spoke to the Rep at [employer]  [facility] care).  She says since we keep making changes to the patient's imaging authorization it will have to be resubmitted instead of updated.  It has been in clinical review since 10/30/20 with a new tracking # [id number].  Patient was supposed to come in to the office today but did not show.  Hopefully we can get his CTA ab/pel approved again soon for Dr. Myra Gianotti and his office appt rescheduled.

## 2020-11-27 ENCOUNTER — Ambulatory Visit: Payer: Medicaid Other | Admitting: Cardiology

## 2020-12-28 ENCOUNTER — Telehealth: Payer: Self-pay | Admitting: Cardiology

## 2020-12-28 NOTE — Telephone Encounter (Signed)
Spoke with the patient's wife who states that the patient has been having nose bleeds. Advised patient's wife that he needs to see his primary care provider in regards to his nose bleeds. Advised him to continue current medications. Patient's wife states that he does not have a primary care provider. Asked if they needed assistance with finding a new PCP. Stated that he was in the process of getting one.  Advised patient that he can seek care at an urgent care if nose bleed reoccur and unable to stop. Patient and wife verbalized understanding.

## 2020-12-28 NOTE — Telephone Encounter (Signed)
    Pt c/o medication issue:  1. Name of Medication: aspirin 81 MG chewable tablet  2. How are you currently taking this medication (dosage and times per day)?   3. Are you having a reaction (difficulty breathing--STAT)?   4. What is your medication issue? Pt's fiance calling, she said pt had bleeding episodes. She said pt had nose bleed yesterday 4x. They don't know if Dr. Mayford Knife would like to see him sooner or need to go to the hospital for check up

## 2021-01-14 ENCOUNTER — Encounter (HOSPITAL_COMMUNITY): Payer: Medicaid Other

## 2021-01-14 ENCOUNTER — Ambulatory Visit: Payer: Medicaid Other | Admitting: Surgery

## 2021-02-08 ENCOUNTER — Ambulatory Visit: Payer: Medicaid Other | Admitting: Cardiology

## 2021-02-26 ENCOUNTER — Other Ambulatory Visit: Payer: Self-pay | Admitting: Cardiology

## 2021-03-02 ENCOUNTER — Other Ambulatory Visit: Payer: Self-pay

## 2021-03-02 ENCOUNTER — Encounter (HOSPITAL_COMMUNITY): Payer: Self-pay | Admitting: Emergency Medicine

## 2021-03-02 ENCOUNTER — Emergency Department (HOSPITAL_COMMUNITY)
Admission: EM | Admit: 2021-03-02 | Discharge: 2021-03-02 | Disposition: A | Discharge status: Home / Self Care | Payer: Medicaid Other | Attending: Emergency Medicine | Admitting: Emergency Medicine

## 2021-03-02 DIAGNOSIS — R112 Nausea with vomiting, unspecified: Secondary | ICD-10-CM | POA: Insufficient documentation

## 2021-03-02 DIAGNOSIS — I2510 Atherosclerotic heart disease of native coronary artery without angina pectoris: Secondary | ICD-10-CM | POA: Insufficient documentation

## 2021-03-02 DIAGNOSIS — K0889 Other specified disorders of teeth and supporting structures: Secondary | ICD-10-CM | POA: Diagnosis present

## 2021-03-02 DIAGNOSIS — F17210 Nicotine dependence, cigarettes, uncomplicated: Secondary | ICD-10-CM | POA: Diagnosis not present

## 2021-03-02 DIAGNOSIS — Z79899 Other long term (current) drug therapy: Secondary | ICD-10-CM | POA: Diagnosis not present

## 2021-03-02 DIAGNOSIS — Z20822 Contact with and (suspected) exposure to covid-19: Secondary | ICD-10-CM | POA: Insufficient documentation

## 2021-03-02 DIAGNOSIS — Z951 Presence of aortocoronary bypass graft: Secondary | ICD-10-CM | POA: Diagnosis not present

## 2021-03-02 DIAGNOSIS — I10 Essential (primary) hypertension: Secondary | ICD-10-CM | POA: Diagnosis not present

## 2021-03-02 DIAGNOSIS — K047 Periapical abscess without sinus: Secondary | ICD-10-CM

## 2021-03-02 DIAGNOSIS — Z7982 Long term (current) use of aspirin: Secondary | ICD-10-CM | POA: Diagnosis not present

## 2021-03-02 LAB — COMPREHENSIVE METABOLIC PANEL
ALT: 22 U/L (ref 0–44)
AST: 21 U/L (ref 15–41)
Albumin: 3.7 g/dL (ref 3.5–5.0)
Alkaline Phosphatase: 85 U/L (ref 38–126)
Anion gap: 9 (ref 5–15)
BUN: 19 mg/dL (ref 8–23)
CO2: 28 mmol/L (ref 22–32)
Calcium: 8.9 mg/dL (ref 8.9–10.3)
Chloride: 101 mmol/L (ref 98–111)
Creatinine, Ser: 1.24 mg/dL (ref 0.61–1.24)
GFR, Estimated: >60 mL/min (ref >60)
Glucose, Bld: 113 mg/dL — ABNORMAL HIGH (ref 70–99)
Potassium: 3.7 mmol/L (ref 3.5–5.1)
Sodium: 138 mmol/L (ref 135–145)
Total Bilirubin: 0.9 mg/dL (ref 0.3–1.2)
Total Protein: 7.2 g/dL (ref 6.5–8.1)

## 2021-03-02 LAB — CBC WITH DIFFERENTIAL/PLATELET
Abs Immature Granulocytes: 0.04 10*3/uL (ref 0.00–0.07)
Basophils Absolute: 0.1 10*3/uL (ref 0.0–0.1)
Basophils Relative: 1 %
Eosinophils Absolute: 0 10*3/uL (ref 0.0–0.5)
Eosinophils Relative: 0 %
HCT: 43.1 % (ref 39.0–52.0)
Hemoglobin: 13.9 g/dL (ref 13.0–17.0)
Immature Granulocytes: 0 %
Lymphocytes Relative: 7 %
Lymphs Abs: 0.7 10*3/uL (ref 0.7–4.0)
MCH: 32 pg (ref 26.0–34.0)
MCHC: 32.3 g/dL (ref 30.0–36.0)
MCV: 99.1 fL (ref 80.0–100.0)
Monocytes Absolute: 0.9 10*3/uL (ref 0.1–1.0)
Monocytes Relative: 9 %
Neutro Abs: 8.1 10*3/uL — ABNORMAL HIGH (ref 1.7–7.7)
Neutrophils Relative %: 83 %
Platelets: 281 10*3/uL (ref 150–400)
RBC: 4.35 MIL/uL (ref 4.22–5.81)
RDW: 12.6 % (ref 11.5–15.5)
WBC: 9.7 10*3/uL (ref 4.0–10.5)
nRBC: 0 % (ref 0.0–0.2)

## 2021-03-02 LAB — LACTIC ACID, PLASMA: Lactic Acid, Venous: 1.1 mmol/L (ref 0.5–1.9)

## 2021-03-02 MED ORDER — BENZOCAINE 10 % MT GEL
Freq: Once | OROMUCOSAL | Status: AC
  Filled 2021-03-02: qty 9

## 2021-03-02 MED ORDER — ACETAMINOPHEN 325 MG PO TABS
650.0000 mg | ORAL_TABLET | Freq: Once | ORAL | Status: AC | PRN
  Administered 2021-03-02: 650 mg via ORAL
  Filled 2021-03-02: qty 2

## 2021-03-02 MED ORDER — AMOXICILLIN-POT CLAVULANATE 875-125 MG PO TABS
1.0000 | ORAL_TABLET | Freq: Two times a day (BID) | ORAL | 0 refills | Status: AC
Start: 2021-03-02 — End: 2021-03-07
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
## 2021-03-02 NOTE — Discharge Instructions (Addendum)
  You need to take the antibiotic that was prescribed exactly as written for the full course.  Please attend your dentist appointment as previously scheduled.

## 2021-03-02 NOTE — ED Provider Notes (Addendum)
Emergency Medicine Provider Triage Evaluation Note  Garrett Fleming , a 65 y.o. male  was evaluated in triage.  Pt complains of dental abscess x1 day. Patient reports impacted left upper molar.  He denies any fever.  He states the pain has been constant. Hasn't been to the dentist in "awhile."  Review of Systems  Positive: Dental pain, facial swelling, chills Negative: fever  Physical Exam  BP (!) 149/85 (BP Location: Left Arm)   Pulse 79   Temp (!) 102.6 F (39.2 C) (Oral)   Resp 20   SpO2 96%  Gen:   Awake, no distress  Resp:  Normal effort  MSK:   Moves extremities without difficulty  Other:  Mild swelling to left upper cheek.  No gross abscess seen on dental exam although patient unable to open mouth very wide 2/2 to pain.  Medical Decision Making  Medically screening exam initiated at 4:36 PM.  Appropriate orders placed.  Lenord Carbo was informed that the remainder of the evaluation will be completed by another provider, this initial triage assessment does not replace that evaluation, and the importance of remaining in the ED until their evaluation is complete.  Febrile to 102.6 in triage.  No hypotension or tachycardia.  We will get basic labs including lactic acid and single blood culture for possible evolving sepsis.  Tylenol given for fever and pain.  Patient will need more thorough dental exam once placed in a room.   Portions of this note were generated with Scientist, clinical (histocompatibility and immunogenetics). Dictation errors may occur despite best attempts at proofreading.    Shanon Ace, PA-C 03/02/21 1641    Shanon Ace, PA-C 03/02/21 1642    Wynetta Fines, MD 03/03/21 2049

## 2021-03-02 NOTE — ED Notes (Signed)
DC  instructions reviewed with pt.  Pt verbalized understanding.  PT DC,

## 2021-03-02 NOTE — ED Triage Notes (Signed)
Pt reports L sided dental abscess/facial swelling since yesterday with fever, chills, and vomited x 1.

## 2021-03-03 LAB — SARS CORONAVIRUS 2 (TAT 6-24 HRS): SARS Coronavirus 2: NEGATIVE

## 2021-03-07 LAB — CULTURE, BLOOD (SINGLE): Culture: NO GROWTH

## 2021-05-28 ENCOUNTER — Other Ambulatory Visit: Payer: Self-pay | Admitting: *Deleted

## 2021-05-28 DIAGNOSIS — I10 Essential (primary) hypertension: Secondary | ICD-10-CM

## 2021-05-28 DIAGNOSIS — I2510 Atherosclerotic heart disease of native coronary artery without angina pectoris: Secondary | ICD-10-CM

## 2021-05-28 DIAGNOSIS — E7800 Pure hypercholesterolemia, unspecified: Secondary | ICD-10-CM

## 2021-05-28 MED ORDER — CARVEDILOL 3.125 MG PO TABS: 3.1250 mg | ORAL_TABLET | Freq: Two times a day (BID) | ORAL | 0 refills | Status: DC

## 2021-05-28 MED ORDER — ATORVASTATIN CALCIUM 20 MG PO TABS: 20.0000 mg | ORAL_TABLET | Freq: Every day | ORAL | 0 refills | Status: DC

## 2021-10-25 ENCOUNTER — Ambulatory Visit: Payer: Medicaid Other | Admitting: Cardiology

## 2023-04-12 ENCOUNTER — Other Ambulatory Visit: Payer: Self-pay

## 2023-04-12 ENCOUNTER — Emergency Department (HOSPITAL_COMMUNITY)
Admission: EM | Admit: 2023-04-12 | Discharge: 2023-04-12 | Disposition: A | Discharge status: Home / Self Care | Payer: Medicare HMO | Attending: Emergency Medicine | Admitting: Emergency Medicine

## 2023-04-12 ENCOUNTER — Encounter (HOSPITAL_COMMUNITY): Payer: Self-pay | Admitting: *Deleted

## 2023-04-12 ENCOUNTER — Emergency Department (HOSPITAL_COMMUNITY): Payer: Medicare HMO

## 2023-04-12 DIAGNOSIS — F17200 Nicotine dependence, unspecified, uncomplicated: Secondary | ICD-10-CM | POA: Insufficient documentation

## 2023-04-12 DIAGNOSIS — I10 Essential (primary) hypertension: Secondary | ICD-10-CM | POA: Diagnosis not present

## 2023-04-12 DIAGNOSIS — R0602 Shortness of breath: Secondary | ICD-10-CM | POA: Insufficient documentation

## 2023-04-12 DIAGNOSIS — F1490 Cocaine use, unspecified, uncomplicated: Secondary | ICD-10-CM | POA: Diagnosis not present

## 2023-04-12 DIAGNOSIS — R062 Wheezing: Secondary | ICD-10-CM | POA: Diagnosis not present

## 2023-04-12 DIAGNOSIS — I517 Cardiomegaly: Secondary | ICD-10-CM | POA: Diagnosis not present

## 2023-04-12 DIAGNOSIS — Z95828 Presence of other vascular implants and grafts: Secondary | ICD-10-CM | POA: Diagnosis not present

## 2023-04-12 LAB — CBC WITH DIFFERENTIAL/PLATELET
Abs Immature Granulocytes: 0.01 10*3/uL (ref 0.00–0.07)
Basophils Absolute: 0 10*3/uL (ref 0.0–0.1)
Basophils Relative: 1 %
Eosinophils Absolute: 0.1 10*3/uL (ref 0.0–0.5)
Eosinophils Relative: 2 %
HCT: 41.9 % (ref 39.0–52.0)
Hemoglobin: 13.6 g/dL (ref 13.0–17.0)
Immature Granulocytes: 0 %
Lymphocytes Relative: 25 %
Lymphs Abs: 1.4 10*3/uL (ref 0.7–4.0)
MCH: 32.5 pg (ref 26.0–34.0)
MCHC: 32.5 g/dL (ref 30.0–36.0)
MCV: 100.2 fL — ABNORMAL HIGH (ref 80.0–100.0)
Monocytes Absolute: 0.6 10*3/uL (ref 0.1–1.0)
Monocytes Relative: 10 %
Neutro Abs: 3.3 10*3/uL (ref 1.7–7.7)
Neutrophils Relative %: 62 %
Platelets: 283 10*3/uL (ref 150–400)
RBC: 4.18 MIL/uL — ABNORMAL LOW (ref 4.22–5.81)
RDW: 11.9 % (ref 11.5–15.5)
WBC: 5.4 10*3/uL (ref 4.0–10.5)
nRBC: 0 % (ref 0.0–0.2)

## 2023-04-12 LAB — RAPID URINE DRUG SCREEN, HOSP PERFORMED
Amphetamines: NOT DETECTED
Barbiturates: NOT DETECTED
Benzodiazepines: NOT DETECTED
Cocaine: POSITIVE — AB
Opiates: NOT DETECTED
Tetrahydrocannabinol: NOT DETECTED

## 2023-04-12 LAB — COMPREHENSIVE METABOLIC PANEL
ALT: 17 U/L (ref 0–44)
AST: 21 U/L (ref 15–41)
Albumin: 3.5 g/dL (ref 3.5–5.0)
Alkaline Phosphatase: 85 U/L (ref 38–126)
Anion gap: 11 (ref 5–15)
BUN: 16 mg/dL (ref 8–23)
CO2: 27 mmol/L (ref 22–32)
Calcium: 8.8 mg/dL — ABNORMAL LOW (ref 8.9–10.3)
Chloride: 103 mmol/L (ref 98–111)
Creatinine, Ser: 1.11 mg/dL (ref 0.61–1.24)
GFR, Estimated: >60 mL/min (ref >60)
Glucose, Bld: 114 mg/dL — ABNORMAL HIGH (ref 70–99)
Potassium: 4.5 mmol/L (ref 3.5–5.1)
Sodium: 141 mmol/L (ref 135–145)
Total Bilirubin: 0.6 mg/dL (ref 0.3–1.2)
Total Protein: 7.3 g/dL (ref 6.5–8.1)

## 2023-04-12 LAB — TROPONIN I (HIGH SENSITIVITY): Troponin I (High Sensitivity): 8 ng/L (ref <18)

## 2023-04-12 MED ORDER — ALBUTEROL SULFATE HFA 108 (90 BASE) MCG/ACT IN AERS
2.0000 | INHALATION_SPRAY | Freq: Once | RESPIRATORY_TRACT | Status: AC
  Administered 2023-04-12: 2 via RESPIRATORY_TRACT
  Filled 2023-04-12: qty 6.7

## 2023-04-12 MED ORDER — AZITHROMYCIN 250 MG PO TABS: 250.0000 mg | ORAL_TABLET | Freq: Every day | ORAL | 0 refills | Status: DC

## 2023-04-12 NOTE — ED Notes (Signed)
Pt ambulated into room. Complains of SOB with productive cough for a few weeks worsening past few days. States throat hurts from coughing. Denies chest pain. Wife at bedside. Call bell in reach.

## 2023-04-12 NOTE — ED Triage Notes (Signed)
The pt  reports that he has had difficulty talking and some  sob he has had a recent weight loss

## 2023-04-12 NOTE — ED Provider Notes (Signed)
[facility] EMERGENCY DEPARTMENT AT [facility] Provider Note   CSN: [id number] Arrival date & time: 04/12/23  1702     History Chief Complaint  Patient presents with   Shortness of Breath    HPI Garrett Fleming is a 67 y.o. male presenting for shortness of breath and wheezing.  He is a 67 year old male with an extensive medical history including substance use disorder, hypertension, hyperlipidemia..  Additionally he has a history of cocaine use disorder, aortic arch aneurysm. States that he is coming in with 2 weeks of shortness of breath feeling that he cannot catch his breath after using cocaine. Denies fevers chills nausea vomiting syncope or chest pain.  He is otherwise ambulatory tolerating p.o. intake at this time.  He has been smoking for years states that he is having some worsening wheezing recently.   Patient's recorded medical, surgical, social, medication list and allergies were reviewed in the Snapshot window as part of the initial history.   Review of Systems   Review of Systems  Constitutional:  Negative for chills and fever.  HENT:  Negative for ear pain and sore throat.   Eyes:  Negative for pain and visual disturbance.  Respiratory:  Positive for shortness of breath and wheezing. Negative for cough.   Cardiovascular:  Negative for chest pain and palpitations.  Gastrointestinal:  Negative for abdominal pain and vomiting.  Genitourinary:  Negative for dysuria and hematuria.  Musculoskeletal:  Negative for arthralgias and back pain.  Skin:  Negative for color change and rash.  Neurological:  Negative for seizures and syncope.  All other systems reviewed and are negative.   Physical Exam Updated Vital Signs BP (!) 168/91 (BP Location: Right Arm)   Pulse (!) 55   Temp 98.6 F (37 C) (Oral)   Resp 15   Ht 6\' 4"  (1.93 m)   Wt 98.8 kg   SpO2 98%   BMI 26.51 kg/m  Physical Exam Vitals and nursing note reviewed.  Constitutional:      General: He  is not in acute distress.    Appearance: He is well-developed. He is ill-appearing.  HENT:     Head: Normocephalic and atraumatic.  Eyes:     Conjunctiva/sclera: Conjunctivae normal.  Cardiovascular:     Rate and Rhythm: Normal rate and regular rhythm.     Heart sounds: No murmur heard. Pulmonary:     Effort: Pulmonary effort is normal. No respiratory distress.     Breath sounds: Wheezing present.  Abdominal:     Palpations: Abdomen is soft.     Tenderness: There is no abdominal tenderness.  Musculoskeletal:        General: No swelling.     Cervical back: Neck supple.  Skin:    General: Skin is warm and dry.     Capillary Refill: Capillary refill takes less than 2 seconds.  Neurological:     Mental Status: He is alert.  Psychiatric:        Mood and Affect: Mood normal.      ED Course/ Medical Decision Making/ A&P    Procedures Procedures   Medications Ordered in ED Medications  albuterol (VENTOLIN HFA) 108 (90 Base) MCG/ACT inhaler 2 puff (has no administration in time range)    Medical Decision Making:    Garrett Fleming is a 67 y.o. male who presented to the ED today with acute on chronic shortness of breath detailed above.     Handoff received from EMS.  Patient placed  on continuous vitals and telemetry monitoring while in ED which was reviewed periodically.   Complete initial physical exam performed, notably the patient  was hemodynamically stable no acute distress.      Reviewed and confirmed nursing documentation for past medical history, family history, social history.    Initial Assessment:   With the patient's presentation of shortness of breath, most likely diagnosis is developing COPD in the setting of longstanding tobacco use disorder. Other diagnoses were considered including (but not limited to) pneumonia pneumothorax, ACS, PE, aortic dissection. These are considered less likely due to history of present illness and physical exam findings.   This is  most consistent with an acute life/limb threatening illness complicated by underlying chronic conditions.  Initial Plan:  Screening labs including CBC and Metabolic panel to evaluate for infectious or metabolic etiology of disease.  Urinalysis with reflex culture ordered to evaluate for UTI or relevant urologic/nephrologic pathology.  CXR to evaluate for structural/infectious intrathoracic pathology.  Troponin/EKG to evaluate for cardiac pathology. Objective evaluation as below reviewed with plan for close reassessment  Initial Study Results:   Laboratory  All laboratory results reviewed without evidence of clinically relevant pathology.     EKG EKG was reviewed independently. Rate, rhythm, axis, intervals all examined and without medically relevant abnormality. ST segments without concerns for elevations.    Radiology  All images reviewed independently. Agree with radiology report at this time.   DG Chest Portable 1 View  Result Date: 04/12/2023 CLINICAL DATA:  Shortness of breath EXAM: PORTABLE CHEST 1 VIEW COMPARISON:  11/26/2018 FINDINGS: Mild cardiomegaly. No acute airspace disease or effusion. Stable enlarged cardiomediastinal silhouette with similar appearance of aortic stent. IMPRESSION: No active disease. Mild cardiomegaly. Electronically Signed   By: Jasmine Pang M.D.   On: 04/12/2023 18:33    Reassessment and Plan:   No acute pathology detected on exam.  Wheezing improved after administration of albuterol.  He is coughing substantially and this has been present for 2 weeks.  Likely developing bronchitis, to given duration of symptoms will treat for atypical infections with azithromycin and recommend he follow-up with his PCP in the outpatient setting for ongoing care and management.  Aortic dissection seems grossly inconsistent with this presentation based on lack of pain, well appearance and stable x-ray.   Disposition:  I have considered need for hospitalization, however,  considering all of the above, I believe this patient is stable for discharge at this time.  Patient/family educated about specific return precautions for given chief complaint and symptoms.  Patient/family educated about follow-up with PCP.     Patient/family expressed understanding of return precautions and need for follow-up. Patient spoken to regarding all imaging and laboratory results and appropriate follow up for these results. All education provided in verbal form with additional information in written form. Time was allowed for answering of patient questions. Patient discharged.    Emergency Department Medication Summary:   Medications  albuterol (VENTOLIN HFA) 108 (90 Base) MCG/ACT inhaler 2 puff (has no administration in time range)     Clinical Impression:  1. SOB (shortness of breath)      Data Unavailable   Final Clinical Impression(s) / ED Diagnoses Final diagnoses:  SOB (shortness of breath)    Rx / DC Orders ED Discharge Orders     None         Glyn Ade, MD 04/12/23 1859

## 2023-05-04 ENCOUNTER — Emergency Department (HOSPITAL_COMMUNITY)
Admission: EM | Admit: 2023-05-04 | Discharge: 2023-05-04 | Disposition: A | Discharge status: Home / Self Care | Payer: Medicare HMO | Attending: Emergency Medicine | Admitting: Emergency Medicine

## 2023-05-04 ENCOUNTER — Emergency Department (HOSPITAL_COMMUNITY): Payer: Medicare HMO

## 2023-05-04 ENCOUNTER — Other Ambulatory Visit: Payer: Self-pay

## 2023-05-04 ENCOUNTER — Encounter (HOSPITAL_COMMUNITY): Payer: Self-pay | Admitting: Emergency Medicine

## 2023-05-04 DIAGNOSIS — R1312 Dysphagia, oropharyngeal phase: Secondary | ICD-10-CM | POA: Diagnosis not present

## 2023-05-04 DIAGNOSIS — L03221 Cellulitis of neck: Secondary | ICD-10-CM | POA: Diagnosis not present

## 2023-05-04 DIAGNOSIS — D380 Neoplasm of uncertain behavior of larynx: Secondary | ICD-10-CM | POA: Diagnosis not present

## 2023-05-04 DIAGNOSIS — I10 Essential (primary) hypertension: Secondary | ICD-10-CM | POA: Insufficient documentation

## 2023-05-04 DIAGNOSIS — S2243XA Multiple fractures of ribs, bilateral, initial encounter for closed fracture: Secondary | ICD-10-CM | POA: Diagnosis not present

## 2023-05-04 DIAGNOSIS — R221 Localized swelling, mass and lump, neck: Secondary | ICD-10-CM | POA: Diagnosis not present

## 2023-05-04 DIAGNOSIS — R001 Bradycardia, unspecified: Secondary | ICD-10-CM | POA: Diagnosis not present

## 2023-05-04 DIAGNOSIS — C329 Malignant neoplasm of larynx, unspecified: Secondary | ICD-10-CM | POA: Diagnosis not present

## 2023-05-04 DIAGNOSIS — R590 Localized enlarged lymph nodes: Secondary | ICD-10-CM | POA: Diagnosis not present

## 2023-05-04 DIAGNOSIS — Z681 Body mass index (BMI) 19 or less, adult: Secondary | ICD-10-CM | POA: Diagnosis not present

## 2023-05-04 DIAGNOSIS — J387 Other diseases of larynx: Secondary | ICD-10-CM | POA: Diagnosis not present

## 2023-05-04 DIAGNOSIS — R1310 Dysphagia, unspecified: Secondary | ICD-10-CM | POA: Diagnosis not present

## 2023-05-04 DIAGNOSIS — J988 Other specified respiratory disorders: Secondary | ICD-10-CM | POA: Diagnosis not present

## 2023-05-04 DIAGNOSIS — F1410 Cocaine abuse, uncomplicated: Secondary | ICD-10-CM | POA: Diagnosis not present

## 2023-05-04 DIAGNOSIS — F17210 Nicotine dependence, cigarettes, uncomplicated: Secondary | ICD-10-CM | POA: Diagnosis not present

## 2023-05-04 DIAGNOSIS — D491 Neoplasm of unspecified behavior of respiratory system: Secondary | ICD-10-CM | POA: Diagnosis not present

## 2023-05-04 DIAGNOSIS — R042 Hemoptysis: Secondary | ICD-10-CM | POA: Diagnosis not present

## 2023-05-04 DIAGNOSIS — R627 Adult failure to thrive: Secondary | ICD-10-CM | POA: Diagnosis not present

## 2023-05-04 DIAGNOSIS — Z743 Need for continuous supervision: Secondary | ICD-10-CM | POA: Diagnosis not present

## 2023-05-04 DIAGNOSIS — R633 Feeding difficulties, unspecified: Secondary | ICD-10-CM | POA: Diagnosis not present

## 2023-05-04 DIAGNOSIS — R531 Weakness: Secondary | ICD-10-CM | POA: Diagnosis not present

## 2023-05-04 DIAGNOSIS — E43 Unspecified severe protein-calorie malnutrition: Secondary | ICD-10-CM | POA: Diagnosis not present

## 2023-05-04 DIAGNOSIS — I71019 Dissection of thoracic aorta, unspecified: Secondary | ICD-10-CM | POA: Diagnosis not present

## 2023-05-04 DIAGNOSIS — M7989 Other specified soft tissue disorders: Secondary | ICD-10-CM | POA: Diagnosis not present

## 2023-05-04 LAB — CBC
HCT: 36 % — ABNORMAL LOW (ref 39.0–52.0)
Hemoglobin: 11.9 g/dL — ABNORMAL LOW (ref 13.0–17.0)
MCH: 32.1 pg (ref 26.0–34.0)
MCHC: 33.1 g/dL (ref 30.0–36.0)
MCV: 97 fL (ref 80.0–100.0)
Platelets: 308 10*3/uL (ref 150–400)
RBC: 3.71 MIL/uL — ABNORMAL LOW (ref 4.22–5.81)
RDW: 12 % (ref 11.5–15.5)
WBC: 8.4 10*3/uL (ref 4.0–10.5)
nRBC: 0 % (ref 0.0–0.2)

## 2023-05-04 LAB — COMPREHENSIVE METABOLIC PANEL
ALT: 18 U/L (ref 0–44)
AST: 19 U/L (ref 15–41)
Albumin: 3.4 g/dL — ABNORMAL LOW (ref 3.5–5.0)
Alkaline Phosphatase: 79 U/L (ref 38–126)
Anion gap: 14 (ref 5–15)
BUN: 25 mg/dL — ABNORMAL HIGH (ref 8–23)
CO2: 29 mmol/L (ref 22–32)
Calcium: 9.1 mg/dL (ref 8.9–10.3)
Chloride: 99 mmol/L (ref 98–111)
Creatinine, Ser: 1.35 mg/dL — ABNORMAL HIGH (ref 0.61–1.24)
GFR, Estimated: 58 mL/min — ABNORMAL LOW (ref >60)
Glucose, Bld: 112 mg/dL — ABNORMAL HIGH (ref 70–99)
Potassium: 3.8 mmol/L (ref 3.5–5.1)
Sodium: 142 mmol/L (ref 135–145)
Total Bilirubin: 0.9 mg/dL (ref 0.3–1.2)
Total Protein: 7.9 g/dL (ref 6.5–8.1)

## 2023-05-04 LAB — TSH: TSH: 0.244 u[IU]/mL — ABNORMAL LOW (ref 0.350–4.500)

## 2023-05-04 MED ORDER — PIPERACILLIN-TAZOBACTAM 3.375 G IVPB 30 MIN
3.3750 g | Freq: Once | INTRAVENOUS | Status: AC
  Administered 2023-05-04: 3.375 g via INTRAVENOUS
  Filled 2023-05-04: qty 50

## 2023-05-04 MED ORDER — IOHEXOL 350 MG/ML SOLN
75.0000 mL | Freq: Once | INTRAVENOUS | Status: AC | PRN
  Administered 2023-05-04: 75 mL via INTRAVENOUS

## 2023-05-04 MED ORDER — METHYLPREDNISOLONE SODIUM SUCC 125 MG IJ SOLR
125.0000 mg | Freq: Once | INTRAMUSCULAR | Status: AC
  Administered 2023-05-04: 125 mg via INTRAVENOUS
  Filled 2023-05-04: qty 2

## 2023-05-04 MED ORDER — LACTATED RINGERS IV SOLN: INTRAVENOUS | Status: DC

## 2023-05-04 MED ORDER — VANCOMYCIN HCL 1500 MG/300ML IV SOLN
1500.0000 mg | Freq: Once | INTRAVENOUS | Status: AC
  Administered 2023-05-04: 1500 mg via INTRAVENOUS
  Filled 2023-05-04: qty 300

## 2023-05-04 NOTE — ED Triage Notes (Addendum)
Pt BIB [facility] for growth/mass to throat since yesterday morning, denies difficulty breathing or SOB, pt reports recent congestion; v/s 168/104, HR 84, O2 97%, RR 16, 98.2, CBG 128; pt reports he has not taken BP meds in 2 weeks, raspy voice noted

## 2023-05-04 NOTE — ED Notes (Signed)
[facility] was called to check on patient's Bed status - nothing known yet but are on the list.

## 2023-05-04 NOTE — ED Provider Notes (Addendum)
  Physical Exam  BP (!) 152/108   Pulse 68   Temp 98.4 F (36.9 C) (Oral)   Resp 18   Ht 6\' 4"  (1.93 m)   Wt 76.7 kg   SpO2 97%   BMI 20.57 kg/m   Physical Exam Constitutional:      General: He is not in acute distress.    Appearance: Normal appearance.  HENT:     Head: Normocephalic and atraumatic.     Comments: Hoarse voice, palpable neck mass    Nose: No congestion or rhinorrhea.  Eyes:     General:        Right eye: No discharge.        Left eye: No discharge.     Extraocular Movements: Extraocular movements intact.     Pupils: Pupils are equal, round, and reactive to light.  Cardiovascular:     Rate and Rhythm: Normal rate and regular rhythm.     Heart sounds: No murmur heard. Pulmonary:     Effort: No respiratory distress.     Breath sounds: No wheezing or rales.  Abdominal:     General: There is no distension.     Tenderness: There is no abdominal tenderness.  Musculoskeletal:        General: Normal range of motion.     Cervical back: Normal range of motion.  Skin:    General: Skin is warm and dry.  Neurological:     General: No focal deficit present.     Mental Status: He is alert.     Procedures  Procedures  ED Course / MDM    Medical Decision Making Amount and/or Complexity of Data Reviewed Labs: ordered. Radiology: ordered.  Risk Prescription drug management.   Patient received in handoff.  Ulcerated laryngeal mass pending transfer to [facility]  I spoke with the transfer center this morning around 10 AM who stated that they were still at capacity and will accept the patient in transfer when a bed becomes available.  However, shortly after this call, a [facility] arrived to transfer the patient.  I did ask the EMS crew if the patient has been formally accepted and they indicated that they had.  Patient then transferred to [facility].       [place], [place], MD 05/04/23 1911    Glendora Score, MD 05/04/23  1911

## 2023-05-04 NOTE — ED Notes (Signed)
[facility] called w/patient information, transferred call to [employer] for medical report.
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
## 2023-05-04 NOTE — Progress Notes (Signed)
ED Pharmacy Antibiotic Sign Off An antibiotic consult was received from an ED provider for vancomycin per pharmacy dosing for cellulitis. A chart review was completed to assess appropriateness.   The following one time order(s) were placed:  Vancomycin 1500mg  IV x1  Further antibiotic and/or antibiotic pharmacy consults should be ordered by the admitting provider if indicated.   Thank you for allowing pharmacy to be a part of this patient's care.   Vernard Gambles, PharmD, BCPS   Clinical Pharmacist 05/04/23 4:55 AM

## 2023-05-05 DIAGNOSIS — M7989 Other specified soft tissue disorders: Secondary | ICD-10-CM | POA: Diagnosis not present

## 2023-05-05 DIAGNOSIS — J387 Other diseases of larynx: Secondary | ICD-10-CM | POA: Diagnosis not present

## 2023-05-05 DIAGNOSIS — R221 Localized swelling, mass and lump, neck: Secondary | ICD-10-CM | POA: Diagnosis not present

## 2023-05-05 DIAGNOSIS — C329 Malignant neoplasm of larynx, unspecified: Secondary | ICD-10-CM | POA: Diagnosis not present

## 2023-05-06 DIAGNOSIS — R001 Bradycardia, unspecified: Secondary | ICD-10-CM | POA: Diagnosis not present

## 2023-05-06 DIAGNOSIS — C329 Malignant neoplasm of larynx, unspecified: Secondary | ICD-10-CM | POA: Diagnosis not present

## 2023-05-07 DIAGNOSIS — C329 Malignant neoplasm of larynx, unspecified: Secondary | ICD-10-CM | POA: Diagnosis not present

## 2023-05-07 DIAGNOSIS — D380 Neoplasm of uncertain behavior of larynx: Secondary | ICD-10-CM | POA: Diagnosis not present

## 2023-05-08 DIAGNOSIS — C329 Malignant neoplasm of larynx, unspecified: Secondary | ICD-10-CM | POA: Diagnosis not present

## 2023-05-09 DIAGNOSIS — C329 Malignant neoplasm of larynx, unspecified: Secondary | ICD-10-CM | POA: Diagnosis not present

## 2023-05-10 DIAGNOSIS — C329 Malignant neoplasm of larynx, unspecified: Secondary | ICD-10-CM | POA: Diagnosis not present

## 2023-05-12 DIAGNOSIS — C329 Malignant neoplasm of larynx, unspecified: Secondary | ICD-10-CM | POA: Diagnosis not present

## 2023-05-12 DIAGNOSIS — R1310 Dysphagia, unspecified: Secondary | ICD-10-CM | POA: Diagnosis not present

## 2023-06-11 DIAGNOSIS — R1310 Dysphagia, unspecified: Secondary | ICD-10-CM | POA: Diagnosis not present

## 2023-06-11 DIAGNOSIS — C329 Malignant neoplasm of larynx, unspecified: Secondary | ICD-10-CM | POA: Diagnosis not present

## 2023-06-21 ENCOUNTER — Other Ambulatory Visit: Payer: Self-pay

## 2023-06-21 ENCOUNTER — Emergency Department (HOSPITAL_COMMUNITY): Payer: Medicare HMO | Admitting: Anesthesiology

## 2023-06-21 ENCOUNTER — Emergency Department (HOSPITAL_COMMUNITY): Payer: Medicare HMO

## 2023-06-21 ENCOUNTER — Inpatient Hospital Stay (HOSPITAL_COMMUNITY): Payer: Medicare HMO

## 2023-06-21 ENCOUNTER — Encounter (HOSPITAL_COMMUNITY)
Admission: EM | Disposition: A | Discharge status: Other Acute Inpatient Hospital | Payer: Self-pay | Source: Home / Self Care | Attending: Internal Medicine

## 2023-06-21 ENCOUNTER — Inpatient Hospital Stay (HOSPITAL_COMMUNITY)
Admission: EM | Admit: 2023-06-21 | Discharge: 2023-07-06 | DRG: 004 | Payer: Medicare HMO | Attending: Internal Medicine | Admitting: Internal Medicine

## 2023-06-21 DIAGNOSIS — Z1152 Encounter for screening for COVID-19: Secondary | ICD-10-CM

## 2023-06-21 DIAGNOSIS — I7121 Aneurysm of the ascending aorta, without rupture: Secondary | ICD-10-CM | POA: Diagnosis not present

## 2023-06-21 DIAGNOSIS — Z95828 Presence of other vascular implants and grafts: Secondary | ICD-10-CM

## 2023-06-21 DIAGNOSIS — E860 Dehydration: Secondary | ICD-10-CM | POA: Diagnosis present

## 2023-06-21 DIAGNOSIS — I10 Essential (primary) hypertension: Secondary | ICD-10-CM

## 2023-06-21 DIAGNOSIS — Z8774 Personal history of (corrected) congenital malformations of heart and circulatory system: Secondary | ICD-10-CM | POA: Diagnosis not present

## 2023-06-21 DIAGNOSIS — R609 Edema, unspecified: Secondary | ICD-10-CM | POA: Diagnosis not present

## 2023-06-21 DIAGNOSIS — J387 Other diseases of larynx: Secondary | ICD-10-CM | POA: Diagnosis present

## 2023-06-21 DIAGNOSIS — Z430 Encounter for attention to tracheostomy: Secondary | ICD-10-CM | POA: Diagnosis not present

## 2023-06-21 DIAGNOSIS — J9601 Acute respiratory failure with hypoxia: Secondary | ICD-10-CM | POA: Diagnosis not present

## 2023-06-21 DIAGNOSIS — R627 Adult failure to thrive: Secondary | ICD-10-CM | POA: Diagnosis present

## 2023-06-21 DIAGNOSIS — K269 Duodenal ulcer, unspecified as acute or chronic, without hemorrhage or perforation: Secondary | ICD-10-CM | POA: Diagnosis not present

## 2023-06-21 DIAGNOSIS — T380X5A Adverse effect of glucocorticoids and synthetic analogues, initial encounter: Secondary | ICD-10-CM | POA: Diagnosis present

## 2023-06-21 DIAGNOSIS — M6259 Muscle wasting and atrophy, not elsewhere classified, multiple sites: Secondary | ICD-10-CM | POA: Diagnosis present

## 2023-06-21 DIAGNOSIS — K7689 Other specified diseases of liver: Secondary | ICD-10-CM | POA: Diagnosis not present

## 2023-06-21 DIAGNOSIS — R1110 Vomiting, unspecified: Secondary | ICD-10-CM | POA: Diagnosis not present

## 2023-06-21 DIAGNOSIS — K449 Diaphragmatic hernia without obstruction or gangrene: Secondary | ICD-10-CM | POA: Diagnosis present

## 2023-06-21 DIAGNOSIS — Z5912 Inadequate housing utilities: Secondary | ICD-10-CM

## 2023-06-21 DIAGNOSIS — I77819 Aortic ectasia, unspecified site: Secondary | ICD-10-CM | POA: Diagnosis not present

## 2023-06-21 DIAGNOSIS — J449 Chronic obstructive pulmonary disease, unspecified: Secondary | ICD-10-CM | POA: Diagnosis not present

## 2023-06-21 DIAGNOSIS — D62 Acute posthemorrhagic anemia: Secondary | ICD-10-CM | POA: Diagnosis not present

## 2023-06-21 DIAGNOSIS — J690 Pneumonitis due to inhalation of food and vomit: Secondary | ICD-10-CM | POA: Diagnosis not present

## 2023-06-21 DIAGNOSIS — R221 Localized swelling, mass and lump, neck: Secondary | ICD-10-CM | POA: Diagnosis not present

## 2023-06-21 DIAGNOSIS — R490 Dysphonia: Secondary | ICD-10-CM | POA: Diagnosis present

## 2023-06-21 DIAGNOSIS — K264 Chronic or unspecified duodenal ulcer with hemorrhage: Secondary | ICD-10-CM | POA: Diagnosis not present

## 2023-06-21 DIAGNOSIS — R64 Cachexia: Secondary | ICD-10-CM | POA: Diagnosis not present

## 2023-06-21 DIAGNOSIS — J0411 Acute tracheitis with obstruction: Principal | ICD-10-CM | POA: Diagnosis present

## 2023-06-21 DIAGNOSIS — J9690 Respiratory failure, unspecified, unspecified whether with hypoxia or hypercapnia: Secondary | ICD-10-CM | POA: Diagnosis not present

## 2023-06-21 DIAGNOSIS — I739 Peripheral vascular disease, unspecified: Secondary | ICD-10-CM | POA: Diagnosis present

## 2023-06-21 DIAGNOSIS — R220 Localized swelling, mass and lump, head: Secondary | ICD-10-CM | POA: Diagnosis not present

## 2023-06-21 DIAGNOSIS — Z8719 Personal history of other diseases of the digestive system: Secondary | ICD-10-CM

## 2023-06-21 DIAGNOSIS — F17210 Nicotine dependence, cigarettes, uncomplicated: Secondary | ICD-10-CM | POA: Diagnosis present

## 2023-06-21 DIAGNOSIS — R061 Stridor: Secondary | ICD-10-CM | POA: Diagnosis not present

## 2023-06-21 DIAGNOSIS — N179 Acute kidney failure, unspecified: Secondary | ICD-10-CM | POA: Diagnosis present

## 2023-06-21 DIAGNOSIS — J0410 Acute tracheitis without obstruction: Secondary | ICD-10-CM | POA: Diagnosis present

## 2023-06-21 DIAGNOSIS — Z9181 History of falling: Secondary | ICD-10-CM

## 2023-06-21 DIAGNOSIS — H5461 Unqualified visual loss, right eye, normal vision left eye: Secondary | ICD-10-CM | POA: Diagnosis present

## 2023-06-21 DIAGNOSIS — K2100 Gastro-esophageal reflux disease with esophagitis, without bleeding: Secondary | ICD-10-CM

## 2023-06-21 DIAGNOSIS — J982 Interstitial emphysema: Secondary | ICD-10-CM | POA: Diagnosis present

## 2023-06-21 DIAGNOSIS — Z888 Allergy status to other drugs, medicaments and biological substances status: Secondary | ICD-10-CM

## 2023-06-21 DIAGNOSIS — R0902 Hypoxemia: Principal | ICD-10-CM

## 2023-06-21 DIAGNOSIS — Z5901 Sheltered homelessness: Secondary | ICD-10-CM

## 2023-06-21 DIAGNOSIS — C329 Malignant neoplasm of larynx, unspecified: Secondary | ICD-10-CM | POA: Diagnosis present

## 2023-06-21 DIAGNOSIS — R0602 Shortness of breath: Secondary | ICD-10-CM | POA: Diagnosis present

## 2023-06-21 DIAGNOSIS — K921 Melena: Secondary | ICD-10-CM | POA: Diagnosis not present

## 2023-06-21 DIAGNOSIS — E43 Unspecified severe protein-calorie malnutrition: Secondary | ICD-10-CM | POA: Diagnosis not present

## 2023-06-21 DIAGNOSIS — D630 Anemia in neoplastic disease: Secondary | ICD-10-CM | POA: Diagnosis present

## 2023-06-21 DIAGNOSIS — J988 Other specified respiratory disorders: Secondary | ICD-10-CM | POA: Diagnosis not present

## 2023-06-21 DIAGNOSIS — I2510 Atherosclerotic heart disease of native coronary artery without angina pectoris: Secondary | ICD-10-CM

## 2023-06-21 DIAGNOSIS — Z9889 Other specified postprocedural states: Secondary | ICD-10-CM

## 2023-06-21 DIAGNOSIS — Z681 Body mass index (BMI) 19 or less, adult: Secondary | ICD-10-CM

## 2023-06-21 DIAGNOSIS — I6529 Occlusion and stenosis of unspecified carotid artery: Secondary | ICD-10-CM | POA: Diagnosis not present

## 2023-06-21 DIAGNOSIS — J939 Pneumothorax, unspecified: Secondary | ICD-10-CM | POA: Diagnosis not present

## 2023-06-21 DIAGNOSIS — D649 Anemia, unspecified: Secondary | ICD-10-CM | POA: Diagnosis not present

## 2023-06-21 DIAGNOSIS — K209 Esophagitis, unspecified without bleeding: Secondary | ICD-10-CM | POA: Diagnosis not present

## 2023-06-21 DIAGNOSIS — I517 Cardiomegaly: Secondary | ICD-10-CM | POA: Diagnosis not present

## 2023-06-21 DIAGNOSIS — Z931 Gastrostomy status: Secondary | ICD-10-CM | POA: Diagnosis not present

## 2023-06-21 DIAGNOSIS — I771 Stricture of artery: Secondary | ICD-10-CM | POA: Diagnosis not present

## 2023-06-21 DIAGNOSIS — E872 Acidosis, unspecified: Secondary | ICD-10-CM | POA: Diagnosis present

## 2023-06-21 DIAGNOSIS — R54 Age-related physical debility: Secondary | ICD-10-CM | POA: Diagnosis present

## 2023-06-21 DIAGNOSIS — R1310 Dysphagia, unspecified: Secondary | ICD-10-CM | POA: Diagnosis present

## 2023-06-21 DIAGNOSIS — C323 Malignant neoplasm of laryngeal cartilage: Secondary | ICD-10-CM | POA: Diagnosis not present

## 2023-06-21 DIAGNOSIS — Z930 Tracheostomy status: Secondary | ICD-10-CM | POA: Diagnosis not present

## 2023-06-21 DIAGNOSIS — R7989 Other specified abnormal findings of blood chemistry: Secondary | ICD-10-CM | POA: Diagnosis present

## 2023-06-21 DIAGNOSIS — Z8679 Personal history of other diseases of the circulatory system: Secondary | ICD-10-CM

## 2023-06-21 DIAGNOSIS — T797XXA Traumatic subcutaneous emphysema, initial encounter: Secondary | ICD-10-CM | POA: Diagnosis not present

## 2023-06-21 DIAGNOSIS — R109 Unspecified abdominal pain: Secondary | ICD-10-CM | POA: Diagnosis not present

## 2023-06-21 DIAGNOSIS — R918 Other nonspecific abnormal finding of lung field: Secondary | ICD-10-CM | POA: Diagnosis not present

## 2023-06-21 DIAGNOSIS — K2101 Gastro-esophageal reflux disease with esophagitis, with bleeding: Secondary | ICD-10-CM | POA: Diagnosis not present

## 2023-06-21 DIAGNOSIS — R000 Tachycardia, unspecified: Secondary | ICD-10-CM | POA: Diagnosis not present

## 2023-06-21 DIAGNOSIS — Z5982 Transportation insecurity: Secondary | ICD-10-CM

## 2023-06-21 DIAGNOSIS — Z79899 Other long term (current) drug therapy: Secondary | ICD-10-CM

## 2023-06-21 DIAGNOSIS — Z8781 Personal history of (healed) traumatic fracture: Secondary | ICD-10-CM

## 2023-06-21 DIAGNOSIS — J90 Pleural effusion, not elsewhere classified: Secondary | ICD-10-CM | POA: Diagnosis not present

## 2023-06-21 DIAGNOSIS — N281 Cyst of kidney, acquired: Secondary | ICD-10-CM | POA: Diagnosis not present

## 2023-06-21 DIAGNOSIS — Z5900 Homelessness unspecified: Secondary | ICD-10-CM | POA: Diagnosis not present

## 2023-06-21 HISTORY — PX: TRACHEOSTOMY TUBE PLACEMENT: SHX814

## 2023-06-21 LAB — CBC WITH DIFFERENTIAL/PLATELET
Abs Immature Granulocytes: 0.03 10*3/uL (ref 0.00–0.07)
Basophils Absolute: 0 10*3/uL (ref 0.0–0.1)
Basophils Relative: 0 %
Eosinophils Absolute: 0 10*3/uL (ref 0.0–0.5)
Eosinophils Relative: 0 %
HCT: 43.7 % (ref 39.0–52.0)
Hemoglobin: 14.3 g/dL (ref 13.0–17.0)
Immature Granulocytes: 0 %
Lymphocytes Relative: 9 %
Lymphs Abs: 1 10*3/uL (ref 0.7–4.0)
MCH: 30.7 pg (ref 26.0–34.0)
MCHC: 32.7 g/dL (ref 30.0–36.0)
MCV: 93.8 fL (ref 80.0–100.0)
Monocytes Absolute: 0.8 10*3/uL (ref 0.1–1.0)
Monocytes Relative: 6 %
Neutro Abs: 10.3 10*3/uL — ABNORMAL HIGH (ref 1.7–7.7)
Neutrophils Relative %: 85 %
Platelets: 390 10*3/uL (ref 150–400)
RBC: 4.66 MIL/uL (ref 4.22–5.81)
RDW: 13.3 % (ref 11.5–15.5)
WBC: 12.2 10*3/uL — ABNORMAL HIGH (ref 4.0–10.5)
nRBC: 0 % (ref 0.0–0.2)

## 2023-06-21 LAB — COMPREHENSIVE METABOLIC PANEL
ALT: 32 U/L (ref 0–44)
AST: 23 U/L (ref 15–41)
Albumin: 3.7 g/dL (ref 3.5–5.0)
Alkaline Phosphatase: 97 U/L (ref 38–126)
Anion gap: 17 — ABNORMAL HIGH (ref 5–15)
BUN: 45 mg/dL — ABNORMAL HIGH (ref 8–23)
CO2: 25 mmol/L (ref 22–32)
Calcium: 9.4 mg/dL (ref 8.9–10.3)
Chloride: 98 mmol/L (ref 98–111)
Creatinine, Ser: 1.43 mg/dL — ABNORMAL HIGH (ref 0.61–1.24)
GFR, Estimated: 54 mL/min — ABNORMAL LOW (ref >60)
Glucose, Bld: 140 mg/dL — ABNORMAL HIGH (ref 70–99)
Potassium: 4.1 mmol/L (ref 3.5–5.1)
Sodium: 140 mmol/L (ref 135–145)
Total Bilirubin: 1.5 mg/dL — ABNORMAL HIGH (ref 0.3–1.2)
Total Protein: 7.5 g/dL (ref 6.5–8.1)

## 2023-06-21 LAB — RESP PANEL BY RT-PCR (RSV, FLU A&B, COVID)  RVPGX2
Influenza A by PCR: NEGATIVE
Influenza B by PCR: NEGATIVE
Resp Syncytial Virus by PCR: NEGATIVE
SARS Coronavirus 2 by RT PCR: NEGATIVE

## 2023-06-21 LAB — MRSA NEXT GEN BY PCR, NASAL: MRSA by PCR Next Gen: NOT DETECTED

## 2023-06-21 LAB — I-STAT CG4 LACTIC ACID, ED: Lactic Acid, Venous: 2 mmol/L (ref 0.5–1.9)

## 2023-06-21 LAB — GLUCOSE, CAPILLARY: Glucose-Capillary: 153 mg/dL — ABNORMAL HIGH (ref 70–99)

## 2023-06-21 SURGERY — CREATION, TRACHEOSTOMY
Anesthesia: Monitor Anesthesia Care

## 2023-06-21 MED ORDER — ORAL CARE MOUTH RINSE
15.0000 mL | OROMUCOSAL | Status: DC
  Administered 2023-06-21 – 2023-07-06 (×56): 15 mL via OROMUCOSAL

## 2023-06-21 MED ORDER — POLYETHYLENE GLYCOL 3350 17 G PO PACK
17.0000 g | PACK | Freq: Every day | ORAL | Status: DC
  Administered 2023-06-21 – 2023-07-06 (×13): 17 g
  Filled 2023-06-21 (×15): qty 1

## 2023-06-21 MED ORDER — POLYETHYLENE GLYCOL 3350 17 G PO PACK: 17.0000 g | PACK | Freq: Every day | ORAL | Status: DC | PRN

## 2023-06-21 MED ORDER — FENTANYL CITRATE PF 50 MCG/ML IJ SOSY: 25.0000 ug | PREFILLED_SYRINGE | INTRAMUSCULAR | Status: DC | PRN

## 2023-06-21 MED ORDER — DEXTROSE IN LACTATED RINGERS 5 % IV SOLN: INTRAVENOUS | Status: DC

## 2023-06-21 MED ORDER — ONDANSETRON HCL 4 MG/2ML IJ SOLN
4.0000 mg | Freq: Four times a day (QID) | INTRAMUSCULAR | Status: DC | PRN
  Administered 2023-06-25 – 2023-06-27 (×3): 4 mg via INTRAVENOUS
  Filled 2023-06-21 (×3): qty 2

## 2023-06-21 MED ORDER — SODIUM CHLORIDE 0.9 % IV SOLN
3.0000 g | Freq: Four times a day (QID) | INTRAVENOUS | Status: AC
  Administered 2023-06-21 – 2023-06-26 (×21): 3 g via INTRAVENOUS
  Filled 2023-06-21 (×21): qty 8

## 2023-06-21 MED ORDER — EPINEPHRINE 1 MG/10ML IJ SOSY
PREFILLED_SYRINGE | INTRAMUSCULAR | Status: DC | PRN
Start: 2023-06-21 — End: 2023-06-21
  Administered 2023-06-21 (×2): 10 ug via INTRAVENOUS

## 2023-06-21 MED ORDER — PROPOFOL 10 MG/ML IV BOLUS
INTRAVENOUS | Status: AC
  Filled 2023-06-21: qty 20

## 2023-06-21 MED ORDER — LACTATED RINGERS IV BOLUS
500.0000 mL | Freq: Once | INTRAVENOUS | Status: AC
  Administered 2023-06-21: 500 mL via INTRAVENOUS

## 2023-06-21 MED ORDER — LIDOCAINE-EPINEPHRINE 1 %-1:100000 IJ SOLN
INTRAMUSCULAR | Status: AC
  Filled 2023-06-21: qty 1

## 2023-06-21 MED ORDER — PHENYLEPHRINE HCL (PRESSORS) 10 MG/ML IV SOLN
INTRAVENOUS | Status: DC | PRN
Start: 2023-06-21 — End: 2023-06-21
  Administered 2023-06-21: 160 ug via INTRAVENOUS
  Administered 2023-06-21: 80 ug via INTRAVENOUS

## 2023-06-21 MED ORDER — VANCOMYCIN HCL 1500 MG/300ML IV SOLN
1500.0000 mg | Freq: Once | INTRAVENOUS | Status: DC
  Filled 2023-06-21 (×2): qty 300

## 2023-06-21 MED ORDER — LACTATED RINGERS IV SOLN
INTRAVENOUS | Status: DC | PRN
Start: 2023-06-21 — End: 2023-06-21

## 2023-06-21 MED ORDER — DOCUSATE SODIUM 50 MG/5ML PO LIQD
100.0000 mg | Freq: Two times a day (BID) | ORAL | Status: DC
  Administered 2023-06-21 – 2023-06-27 (×10): 100 mg
  Filled 2023-06-21 (×12): qty 10

## 2023-06-21 MED ORDER — LIDOCAINE-EPINEPHRINE 1 %-1:100000 IJ SOLN
INTRAMUSCULAR | Status: DC | PRN
  Administered 2023-06-21: 6 mL

## 2023-06-21 MED ORDER — DEXMEDETOMIDINE HCL IN NACL 80 MCG/20ML IV SOLN
INTRAVENOUS | Status: DC | PRN
Start: 2023-06-21 — End: 2023-06-21
  Administered 2023-06-21: 12 ug via INTRAVENOUS
  Administered 2023-06-21: 4 ug via INTRAVENOUS
  Administered 2023-06-21: 8 ug via INTRAVENOUS

## 2023-06-21 MED ORDER — CHLORHEXIDINE GLUCONATE CLOTH 2 % EX PADS
6.0000 | MEDICATED_PAD | Freq: Every day | CUTANEOUS | Status: DC
  Administered 2023-06-21 – 2023-06-26 (×7): 6 via TOPICAL

## 2023-06-21 MED ORDER — FAMOTIDINE 20 MG PO TABS
20.0000 mg | ORAL_TABLET | Freq: Two times a day (BID) | ORAL | Status: DC
  Administered 2023-06-21 – 2023-06-22 (×2): 20 mg
  Filled 2023-06-21 (×2): qty 1

## 2023-06-21 MED ORDER — DEXAMETHASONE SODIUM PHOSPHATE 4 MG/ML IJ SOLN
4.0000 mg | Freq: Once | INTRAMUSCULAR | Status: AC
  Administered 2023-06-21: 4 mg via INTRAVENOUS
  Filled 2023-06-21: qty 1

## 2023-06-21 MED ORDER — VASOPRESSIN 20 UNIT/ML IV SOLN
INTRAVENOUS | Status: AC
  Filled 2023-06-21: qty 1

## 2023-06-21 MED ORDER — VASOPRESSIN 20 UNIT/ML IV SOLN
INTRAVENOUS | Status: DC | PRN
Start: 2023-06-21 — End: 2023-06-21
  Administered 2023-06-21 (×3): 2 [IU] via INTRAVENOUS

## 2023-06-21 MED ORDER — DOCUSATE SODIUM 100 MG PO CAPS: 100.0000 mg | ORAL_CAPSULE | Freq: Two times a day (BID) | ORAL | Status: DC | PRN

## 2023-06-21 MED ORDER — DEXMEDETOMIDINE HCL IN NACL 400 MCG/100ML IV SOLN
0.0000 ug/kg/h | INTRAVENOUS | Status: DC
  Administered 2023-06-21 – 2023-06-22 (×2): 0.4 ug/kg/h via INTRAVENOUS
  Filled 2023-06-21 (×2): qty 100

## 2023-06-21 MED ORDER — PIPERACILLIN-TAZOBACTAM 3.375 G IVPB 30 MIN
3.3750 g | Freq: Once | INTRAVENOUS | Status: AC
  Administered 2023-06-21: 3.375 g via INTRAVENOUS
  Filled 2023-06-21: qty 50

## 2023-06-21 MED ORDER — FENTANYL CITRATE PF 50 MCG/ML IJ SOSY
25.0000 ug | PREFILLED_SYRINGE | INTRAMUSCULAR | Status: DC | PRN
  Administered 2023-06-22: 50 ug via INTRAVENOUS
  Filled 2023-06-21: qty 1

## 2023-06-21 MED ORDER — ORAL CARE MOUTH RINSE: 15.0000 mL | OROMUCOSAL | Status: DC | PRN

## 2023-06-21 MED ORDER — PROPOFOL 10 MG/ML IV BOLUS
INTRAVENOUS | Status: DC | PRN
Start: 2023-06-21 — End: 2023-06-21
  Administered 2023-06-21: 20 mg via INTRAVENOUS
  Administered 2023-06-21: 120 mg via INTRAVENOUS

## 2023-06-21 SURGICAL SUPPLY — 46 items
BAG COUNTER SPONGE SURGICOUNT (BAG) ×1 IMPLANT
BAG SPNG CNTER NS LX DISP (BAG) ×1
BLADE CLIPPER SURG (BLADE) IMPLANT
BLADE SURG 15 STRL LF DISP TIS (BLADE) IMPLANT
BLADE SURG 15 STRL SS (BLADE)
CANISTER SUCT 3000ML PPV (MISCELLANEOUS) ×1 IMPLANT
CLEANER TIP ELECTROSURG 2X2 (MISCELLANEOUS) ×1 IMPLANT
CORD BIPOLAR FORCEPS 12FT (ELECTRODE) ×1 IMPLANT
COVER SURGICAL LIGHT HANDLE (MISCELLANEOUS) ×1 IMPLANT
DRAPE HALF SHEET 40X57 (DRAPES) IMPLANT
ELECT COATED BLADE 2.86 ST (ELECTRODE) ×1 IMPLANT
ELECT REM PT RETURN 9FT ADLT (ELECTROSURGICAL) ×1
ELECTRODE REM PT RTRN 9FT ADLT (ELECTROSURGICAL) ×1 IMPLANT
FORCEPS BIPOLAR SPETZLER 8 1.0 (NEUROSURGERY SUPPLIES) IMPLANT
GAUZE 4X4 16PLY ~~LOC~~+RFID DBL (SPONGE) ×1 IMPLANT
GAUZE XEROFORM 5X9 LF (GAUZE/BANDAGES/DRESSINGS) IMPLANT
GLOVE BIO SURGEON STRL SZ7.5 (GLOVE) ×1 IMPLANT
GLOVE BIOGEL PI IND STRL 8 (GLOVE) ×1 IMPLANT
GOWN STRL REUS W/ TWL LRG LVL3 (GOWN DISPOSABLE) ×1 IMPLANT
GOWN STRL REUS W/ TWL XL LVL3 (GOWN DISPOSABLE) ×1 IMPLANT
GOWN STRL REUS W/TWL LRG LVL3 (GOWN DISPOSABLE) ×1
GOWN STRL REUS W/TWL XL LVL3 (GOWN DISPOSABLE) ×1
HOLDER TRACH TUBE VELCRO 19.5 (MISCELLANEOUS) IMPLANT
KIT BASIN OR (CUSTOM PROCEDURE TRAY) ×1 IMPLANT
KIT SUCTION CATH 14FR (SUCTIONS) IMPLANT
KIT TURNOVER KIT B (KITS) ×1 IMPLANT
NDL HYPO 25GX1X1/2 BEV (NEEDLE) ×1 IMPLANT
NEEDLE HYPO 25GX1X1/2 BEV (NEEDLE) ×1 IMPLANT
NS IRRIG 1000ML POUR BTL (IV SOLUTION) ×1 IMPLANT
PAD ARMBOARD 7.5X6 YLW CONV (MISCELLANEOUS) ×2 IMPLANT
PENCIL SMOKE EVACUATOR (MISCELLANEOUS) ×1 IMPLANT
SPONGE DRAIN TRACH 4X4 STRL 2S (GAUZE/BANDAGES/DRESSINGS) ×1 IMPLANT
SPONGE INTESTINAL PEANUT (DISPOSABLE) ×1 IMPLANT
STAPLER VISISTAT 35W (STAPLE) ×1 IMPLANT
SUT CHROMIC 2 0 SH (SUTURE) ×1 IMPLANT
SUT ETHILON 2 0 FS 18 (SUTURE) ×2 IMPLANT
SUT SILK 2 0 PERMA HAND 18 BK (SUTURE) ×1 IMPLANT
SUT SILK 3 0 REEL (SUTURE) ×1 IMPLANT
SYR 20ML LL LF (SYRINGE) ×1 IMPLANT
SYR BULB IRRIG 60ML STRL (SYRINGE) IMPLANT
SYR CONTROL 10ML LL (SYRINGE) ×1 IMPLANT
Shiley 6 CUFFED ×1
TRAY ENT MC OR (CUSTOM PROCEDURE TRAY) ×1 IMPLANT
TUBE CONNECTING 12X1/4 (SUCTIONS) ×1 IMPLANT
TUBE TRACH 6.0 CUFF FLEX (MISCELLANEOUS) IMPLANT
WATER STERILE IRR 1000ML POUR (IV SOLUTION) ×1 IMPLANT

## 2023-06-21 NOTE — Progress Notes (Signed)
06/21/2023 Discussed with ENT: awake trach: airway full of pus Start unsasyn, check culture data PAD/VAP prevention bundle Wean to TC as able Can go to floor as early as tomorrow if stable on TC  Myrla Halsted MD PCCM

## 2023-06-21 NOTE — Anesthesia Preprocedure Evaluation (Signed)
Anesthesia Evaluation  Patient identified by MRN, date of birth, ID band Patient awake  General Assessment Comment:Pt in respiratory distress from upper airway obstruction.Preop documentation limited or incomplete due to emergent nature of procedure.  Airway Mallampati: III   Neck ROM: limited    Dental   Pulmonary shortness of breath, Current Smoker and Patient abstained from smoking.     unstable     Cardiovascular hypertension, + Peripheral Vascular Disease   Rhythm:regular Rate:Tachycardia     Neuro/Psych    GI/Hepatic ,,,(+)     substance abuse  cocaine use  Endo/Other    Renal/GU      Musculoskeletal  (+) Arthritis ,    Abdominal   Peds  Hematology   Anesthesia Other Findings   Reproductive/Obstetrics                             Anesthesia Physical Anesthesia Plan  ASA: 4 and emergent  Anesthesia Plan: MAC   Post-op Pain Management:    Induction: Intravenous  PONV Risk Score and Plan: 0 and Treatment may vary due to age or medical condition  Airway Management Planned: Simple Face Mask  Additional Equipment:   Intra-op Plan:   Post-operative Plan: Post-operative intubation/ventilation  Informed Consent:   Plan Discussed with: CRNA, Anesthesiologist and Surgeon  Anesthesia Plan Comments:        Anesthesia Quick Evaluation

## 2023-06-21 NOTE — ED Provider Notes (Signed)
[place] EMERGENCY DEPARTMENT AT [facility] Provider Note   CSN: [id number] Arrival date & time: 06/21/23  1615     History  Chief Complaint  Patient presents with   Shortness of Breath    Garrett Fleming is a 67 y.o. male.  The history is provided by the patient and medical records. The history is limited by the condition of the patient. No language interpreter was used.  Shortness of Breath Severity:  Severe Onset quality:  Gradual Duration:  4 days Timing:  Constant Progression:  Worsening Chronicity:  Recurrent Context: URI   Relieved by:  Position changes Worsened by:  Deep breathing and coughing Ineffective treatments:  None tried Associated symptoms: cough, neck pain, sputum production and wheezing   Associated symptoms: no abdominal pain, no chest pain, no diaphoresis, no fever, no headaches, no rash and no vomiting        Home Medications Prior to Admission medications   Medication Sig Start Date End Date Taking? Authorizing Provider  lisinopril (ZESTRIL) 10 MG tablet Take 1 tablet (10 mg total) by mouth daily. 10/19/19 02/13/20  Quintella Reichert, MD      Allergies    Lisinopril    Review of Systems   Review of Systems  Constitutional:  Positive for chills and fatigue. Negative for diaphoresis and fever.  HENT:  Negative for congestion.   Respiratory:  Positive for cough, sputum production, chest tightness, shortness of breath and wheezing. Negative for stridor.   Cardiovascular:  Negative for chest pain, palpitations and leg swelling.  Gastrointestinal:  Negative for abdominal pain, constipation, diarrhea, nausea and vomiting.  Genitourinary:  Negative for dysuria and flank pain.  Musculoskeletal:  Positive for neck pain. Negative for back pain.  Skin:  Positive for wound. Negative for rash.  Neurological:  Negative for light-headedness and headaches.  Psychiatric/Behavioral:  Negative for agitation and confusion.   All other systems  reviewed and are negative.   Physical Exam Updated Vital Signs SpO2 (!) 88%  Physical Exam Vitals and nursing note reviewed.  Constitutional:      General: He is in acute distress.     Appearance: He is ill-appearing.  HENT:     Head: Normocephalic and atraumatic.  Eyes:     Conjunctiva/sclera: Conjunctivae normal.     Pupils: Pupils are equal, round, and reactive to light.  Neck:     Comments: Swelling and purulence coming out of anterior neck. Cardiovascular:     Rate and Rhythm: Regular rhythm. Tachycardia present.     Heart sounds: No murmur heard. Pulmonary:     Effort: Respiratory distress present.     Breath sounds: Stridor present. Wheezing and rhonchi present.  Chest:     Chest wall: No tenderness.  Abdominal:     Palpations: Abdomen is soft.     Tenderness: There is no abdominal tenderness.  Musculoskeletal:        General: No swelling.     Right lower leg: No edema.     Left lower leg: No edema.  Skin:    General: Skin is warm and dry.     Capillary Refill: Capillary refill takes less than 2 seconds.     Findings: Erythema present.  Neurological:     General: No focal deficit present.     Mental Status: He is alert.  Psychiatric:        Mood and Affect: Mood normal.     ED Results / Procedures / Treatments   Labs (  all labs ordered are listed, but only abnormal results are displayed) Labs Reviewed  CBC WITH DIFFERENTIAL/PLATELET - Abnormal; Notable for the following components:      Result Value   WBC 12.2 (*)    Neutro Abs 10.3 (*)    All other components within normal limits  COMPREHENSIVE METABOLIC PANEL - Abnormal; Notable for the following components:   Glucose, Bld 140 (*)    BUN 45 (*)    Creatinine, Ser 1.43 (*)    Total Bilirubin 1.5 (*)    GFR, Estimated 54 (*)    Anion gap 17 (*)    All other components within normal limits  I-STAT CG4 LACTIC ACID, ED - Abnormal; Notable for the following components:   Lactic Acid, Venous 2.0 (*)     All other components within normal limits  RESP PANEL BY RT-PCR (RSV, FLU A&B, COVID)  [id number]  CULTURE, BLOOD (ROUTINE X 2)  CULTURE, BLOOD (ROUTINE X 2)  URINALYSIS, W/ REFLEX TO CULTURE (INFECTION SUSPECTED)  I-STAT CG4 LACTIC ACID, ED    EKG EKG Interpretation Date/Time:  Sunday June 21 2023 16:26:21 EDT Ventricular Rate:  100 PR Interval:  142 QRS Duration:  87 QT Interval:  344 QTC Calculation: 444 R Axis:   80  Text Interpretation: Sinus tachycardia Left atrial enlargement Probable left ventricular hypertrophy when compared to prior, faster rate. No STEMI Confirmed by Theda Belfast ([id number]) on 06/21/2023 4:42:41 PM  Radiology DG Chest Portable 1 View  Result Date: 06/21/2023 CLINICAL DATA:  hypoxia EXAM: PORTABLE CHEST 1 VIEW COMPARISON:  April 12, 2023 FINDINGS: Enlarged cardiomediastinal silhouette. Question increased conspicuity of the contours of the ascending thoracic aorta.Status post repair of the aortic arch. Tortuous thoracic aorta. No pleural effusion. No pneumothorax. No acute pleuroparenchymal abnormality. IMPRESSION: Question increased conspicuity of the contours of the ascending thoracic aorta. This could be due to rotation. Recommend dedicated PA and lateral chest radiograph for improved evaluation. If clinical concern for new aortic injury, then recommend dedicated CTA chest. Electronically Signed   By: Meda Klinefelter M.D.   On: 06/21/2023 17:12    Procedures Procedures    CRITICAL CARE Performed by: Canary Brim Shelbia Scinto Total critical care time: 45 minutes Critical care time was exclusive of separately billable procedures and treating other patients. Critical care was necessary to treat or prevent imminent or life-threatening deterioration. Critical care was time spent personally by me on the following activities: development of treatment plan with patient and/or surrogate as well as nursing, discussions with consultants, evaluation of patient's  response to treatment, examination of patient, obtaining history from patient or surrogate, ordering and performing treatments and interventions, ordering and review of laboratory studies, ordering and review of radiographic studies, pulse oximetry and re-evaluation of patient's condition.  Medications Ordered in ED Medications  vancomycin (VANCOREADY) IVPB 1500 mg/300 mL (1,500 mg Intravenous Not Given 06/21/23 1800)  dextrose 5 % in lactated ringers infusion (has no administration in time range)  piperacillin-tazobactam (ZOSYN) IVPB 3.375 g (0 g Intravenous Stopped 06/21/23 1726)  lactated ringers bolus 500 mL (500 mLs Intravenous New Bag/Given 06/21/23 1752)  dexamethasone (DECADRON) injection 4 mg (4 mg Intravenous Given 06/21/23 1751)    ED Course/ Medical Decision Making/ A&P                                 Medical Decision Making Amount and/or Complexity of Data Reviewed Labs: ordered. Radiology: ordered.  Risk Prescription  drug management. Decision regarding hospitalization.    Garrett Fleming is a 67 y.o. male with a past medical history significant for previous substance abuse, send aortic aneurysm, and no neurological mass who presents with hypoxia, productive cough, neck soreness with purulence coming out of neck mass, fatigue, and some chills.  According to patient, he has been feeling bad for the last few days and was having hypoxia into the low 80s at home on room air.  With EMS he had some wheezing and rhonchi and was given DuoNeb, magnesium, and Solu-Medrol.  Patient arrived on nonrebreather with oxygen saturations only in the mid 80s and not improving.  On my initial evaluation oxygen has now trickled up to around 90% on the nonrebreather.  Patient is complaining of soreness in his neck anteriorly where he has some purulence coming out of a wound.  He had very coarse breath sounds with some rhonchi as well.  Chest and abdomen were nontender.  Moving all extremities.  Patient  ill-appearing.  He is tachycardic, tachypneic, hypoxic, and warm to the touch although he was technically afebrile.  Clinically concerned he could have infection in this wound/mass.  It appears that during his previous visit he had to be transferred to [facility].  Patient said he went there and was discharged and has not been able to follow-up with them since.  Due to his ill appearance and nonrebreather use with oxygen only around 90%, will call critical care and have them get involved for management.  Will get CT imaging of his neck as well as his chest.  We will get screening labs.  Will order antibiotics for the suspected mass infection in the neck.  Anticipate admission after workup is completed for further management.  Anticipate consultation with ENT as well.        5:10 PM Critical care saw the patient and is very concerned about this upper airway emergency.  They do not feel the patient is able to lay backwards for CT or even lay prone to get a CT at this time.  He becomes very stridorous.  Care requested ENT be consulted to come see the patient to determine if he needs to go the OR for a upright intubation and possible surgical intervention.  Patient may end up needing transfer to higher level care as he did last month however right now I am concerned about his safety for transport and critical care agrees.  Will call ENT at critical care recommendation.  5:35 PM ENT will come see the patient and agree he likely is to go to the OR.  They requested I speak to anesthesia and they will activate the OR.  Patient will be managed by critical care after getting airway secured.  Anticipate he will need imaging once airway secured to reassess his neck and chest.        Final Clinical Impression(s) / ED Diagnoses Final diagnoses:  Hypoxia  Shortness of breath  Stridor     Clinical Impression: 1. Hypoxia   2. Shortness of breath   3. Stridor     Disposition: Admit  This note  was prepared with assistance of Conservation officer, historic buildings. Occasional wrong-word or sound-a-like substitutions may have occurred due to the inherent limitations of voice recognition software.     Prudy Candy, Canary Brim, MD 06/21/23 [phone]
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
## 2023-06-21 NOTE — ED Triage Notes (Signed)
Pt brought in via ems from home w cc of SOB worsening over 4 days Pt has hx of laryngeal mass but has been unable to make his appointments pt was 88% RA and received duoneb, mag and solu medrol from ems and had no relief in symptoms pt also noted to have been coughing up green tinged sputum and having drainage from mass. Pt was unable to tolerate cpap en route and was placed on NRB mask  Pt had g-tube placed recently

## 2023-06-21 NOTE — Transfer of Care (Signed)
Immediate Anesthesia Transfer of Care Note  Patient: Garrett Fleming  Procedure(s) Performed: TRACHEOSTOMY  Patient Location: ICU  Anesthesia Type:General  Level of Consciousness: drowsy  Airway & Oxygen Therapy: Patient Spontanous Breathing and Patient connected to T-piece oxygen  Post-op Assessment: Report given to RN and Post -op Vital signs reviewed and stable  Post vital signs: Reviewed and stable  Last Vitals:  Vitals Value Taken Time  BP    Temp    Pulse    Resp 26 06/21/23 1929  SpO2    Vitals shown include unfiled device data.  Last Pain:  Vitals:   06/21/23 1629  TempSrc: Oral  PainSc:          Complications: No notable events documented.

## 2023-06-21 NOTE — Consult Note (Signed)
NAME:  Garrett Fleming, MRN:  [id number], DOB:  [birth year], LOS: 0 ADMISSION DATE:  06/21/2023, CONSULTATION DATE:  06/21/23 REFERRING MD:  Dr Julieanne Manson EDP, CHIEF COMPLAINT:  laryngeal mass, Acute Respiratory Failure, , stridor  PCP Quintella Reichert, MD [facility] - DR   History of Present Illness: - EDP, ERN and chart review    67 year old male with extreme failure to thrive and cachexia having lost 100 pounds in the last 1 year.  [BMI of 18.9 as of August 2024].  He presented to the [facility] ED on 05/04/2023 with a laryngeal mass with external visibility and pressing into the airway.  He then went to [facility] in [place] and admitted there.  He received antibiotics.  Status post ultrasound-guided biopsy on 05/05/2023 insufficient for diagnosis.  Then on 05/08/2023 at the regular endoscopy biopsy was done [pathology report pending].  Was deemed that he would need a total laryngectomy.  The mass was felt to be ulcerated internally.  He then had a PEG tube placed on 05/07/2023 he was treated with antibiotics and Decadron and discharged.     It is unclear if his had any follow-up with ENT oncology as directed by [facility].  But he presented to the ED at [facility] on 06/21/2023 from home with worsening shortness of breath.  Could not tolerate CPAP on the way.  Describing continued weight loss and failure to thrive.  Hypoxemic requiring nonrebreather.  Expressed inability to lie flat.  Any attempt to lie flat produced instant stridor.  The laryngeal mass is ulcerated now and exuding pus.  He did get Solu-Medrol.  Pulmonary medicine/critical care medicine has been asked to consult.  He denied any aspiration of fever.  Chest x-ray is clear.  Unable to lie flat for CT scan      Past Medical History:    has a past medical history of Ankle fracture, left, Arthritis, Ascending aortic aneurysm (HCC) (01/21/2019), Blind right eye, Cocaine abuse (HCC) (11/17/2018), Coronary artery calcification seen on CAT  scan (01/21/2019), Discomfort in chest, Essential hypertension (11/17/2018), Fracture of lateral malleolus of left ankle (12/02/2013), Keloid (11/16/2018), Penetrating atherosclerotic ulcer of aorta (HCC) (11/16/2018), and Smoking (11/17/2018).   reports that he has been smoking cigarettes. He has a 5 pack-year smoking history. He has never used smokeless tobacco.  Past Surgical History:  Procedure Laterality Date   CAROTID-SUBCLAVIAN BYPASS GRAFT Left 11/26/2018   Procedure: LEFT CAROTID TO SUBCLAVIAN ARTERY TRANSPOSITION;  Surgeon: Nada Libman, MD;  Location: [facility] OR;  Service: Vascular;  Laterality: Left;   KENALOG INJECTION Right 04/25/2020   Procedure: kenalog injection to right shoulder keloid;  Surgeon: Peggye Form, DO;  Location: [facility];  Service: Plastics;  Laterality: Right;   LESION REMOVAL Left 04/25/2020   Procedure: Excision of keloid/changing skin lesion of scalp;  Surgeon: Peggye Form, DO;  Location: [facility];  Service: Plastics;  Laterality: Left;  1 hour total, please   ORIF ANKLE FRACTURE Left 12/02/2013   Procedure: OPEN REDUCTION INTERNAL FIXATION (ORIF) LEFT ANKLE FRACTURE;  Surgeon: Kathryne Hitch, MD;  Location: [facility];  Service: Orthopedics;  Laterality: Left;   right eye surgery   1977   THORACIC AORTIC ENDOVASCULAR STENT GRAFT N/A 11/26/2018   Procedure: THORACIC AORTIC ENDOVASCULAR STENT using a GORE TAG CONFORMABLE THORACIC GRAFT;  Surgeon: Nada Libman, MD;  Location: [facility];  Service: Vascular;  Laterality: N/A;   ULTRASOUND GUIDANCE FOR VASCULAR ACCESS Bilateral 11/26/2018  Procedure: Ultrasound Guidance For Vascular Access;  Surgeon: Nada Libman, MD;  Location: [facility];  Service: Vascular;  Laterality: Bilateral;    Allergies  Allergen Reactions   Lisinopril Swelling    Immunization History  Administered Date(s) Administered   Influenza Whole 07/10/2019   Influenza,inj,Quad PF,6+ Mos 07/21/2017,  07/27/2018   Pneumococcal Polysaccharide-23 11/20/2018    No family history on file.   Current Facility-Administered Medications:    piperacillin-tazobactam (ZOSYN) IVPB 3.375 g, 3.375 g, Intravenous, Once, Daylene Posey, RPH, Last Rate: 100 mL/hr at 06/21/23 1656, 3.375 g at 06/21/23 1656   vancomycin (VANCOREADY) IVPB 1500 mg/300 mL, 1,500 mg, Intravenous, Once, Daylene Posey, [facility] No current outpatient medications on file.     Significant Hospital Events:  06/21/2023 - admit   Interim History / Subjective:   06/21/2023 - seen in ER at [facility] bed 34  Objective   Blood pressure (!) 146/99, pulse 85, temperature 98.4 F (36.9 C), temperature source Oral, resp. rate 20, SpO2 96%.       No intake or output data in the 24 hours ending 06/21/23 1724 There were no vitals filed for this visit.  Examination: General: Extremely frail cachectic male.  Lying in the lateral decubitus position right-side-down. HENT: Facemask oxygen on.  Lump seen in the thyroid area.  There is pus protruding from that. Lungs: To auscultation bilaterally.  But when I try to lying flat at 30 degrees he started getting profound stridor.  This resolved and we made a lateral decub Cardiovascular: Normal heart sounds Abdomen: Scaphoid has a PEG.  No pus drainage around the PEG.  Nontender Extremities: Extreme muscle wasting across all extremities.  Moves extremities normally his shoes look bigger than his legs Neuro: Alert and oriented.  Hoarse voice. GU: Not examined  Resolved Hospital Problem list   X  Assessment & Plan:     Upper airway mass laryngeal mass that is ulcerated status post biopsy 05/05/2023 at [facility] Progression in the mass [in need of post total laryngectomy]  -causing stridor -Suspect very narrow airway; very high risk Acute hypoxemic respiratory failure secondary to the above [suspected aspiration although he denies]   P:   Facemask oxygen He will not tolerate BiPAP  or CPAP and this is contraindicated If he progresses requires intubation but under anesthesia [he is high risk airway] Emergent/urgent ENT consult over the next few to several hours in the ER itself  -If they operate on him here in [place] then he can come to the critical care services based on the opinion.  CCM can be primary at that point''  Will do Decadron 4mg  IV x 1  Will need to know the status of the biopsy report from May 05, 2023 at [facility]  Possible aspiration given upper airway obstruction and PEG tube but patient denies Also secondary infection of laryngel mass now with ulcaration    P:   Vanc Zosyn    Mild lactic acidosis    P Fluid bolus and maintenance fluids monitor   Status post PEG tube in May 05, 2023 due to dysphagia Croop airway compromise    P:   N.p.o. Medications through PEG tube No tube feeds at this point   MSK/DERM Extreme cachexia Failure to thrive  Plan  - monitor  Best practice (daily eval):  Diet: N.p.o. and use PEG tube only for medicines Pain/Anxiety/Delirium protocol (if indicated): Low-dose fentanyl as needed for pain as decided by EDP VAP protocol (if indicated): Not applicable  DVT prophylaxis: Depends after admission GI prophylaxis: Depends after admission Glucose control: Depends after admission Mobility: Bedrest Code Status: Full code Family Communication: According to the EDP was primary Disposition: Stay in the ER till ENT consult and further disposition decided [facility] versus [facility]]  CCM will be on standby if needed.  Get labs by EDP   ATTESTATION & SIGNATURE   The patient SHAFFER CARRY is critically ill with multiple organ systems failure and requires high complexity decision making for assessment and support, frequent evaluation and titration of therapies, application of advanced monitoring technologies and extensive interpretation of multiple databases.    Critical Care Time devoted to patient care services described in this note is  45  Minutes. This time reflects time of care of this signee Dr Kalman Shan. This critical care time does not reflect procedure time, or teaching time or supervisory time of PA/NP/Med student/Med Resident etc but could involve care discussion time      SIGNATURE    Dr. Kalman Shan, M.D., F.C.C.P,  Pulmonary and Critical Care Medicine Staff Physician, [facility] Director - Interstitial Lung Disease  Program  Pulmonary Fibrosis [facility] Network at [facility] [place], [place], [address]  NPI Number:  NPI #[id number]  Pager: [phone], If no answer  -> Check AMION or Try [phone] Telephone (clinical office): [phone] Telephone (research): [phone]  5:24 PM 06/21/2023   06/21/2023 5:24 PM    LABS    PULMONARY No results for input(s): "PHART", "PCO2ART", "PO2ART", "HCO3", "TCO2", "O2SAT" in the last 168 hours.  Invalid input(s): "PCO2", "PO2"  CBC Recent Labs  Lab 06/21/23 1640  HGB 14.3  HCT 43.7  WBC 12.2*  PLT 390    COAGULATION No results for input(s): "INR" in the last 168 hours.  CARDIAC  No results for input(s): "TROPONINI" in the last 168 hours. No results for input(s): "PROBNP" in the last 168 hours.  CHEMISTRY No results for input(s): "NA", "K", "CL", "CO2", "GLUCOSE", "BUN", "CREATININE", "CALCIUM", "MG", "PHOS" in the last 168 hours. CrCl cannot be calculated (Patient's most recent lab result is older than the maximum 21 days allowed.).   LIVER No results for input(s): "AST", "ALT", "ALKPHOS", "BILITOT", "PROT", "ALBUMIN", "INR" in the last 168 hours.   INFECTIOUS Recent Labs  Lab 06/21/23 1655  LATICACIDVEN 2.0*     ENDOCRINE CBG (last 3)  No results for input(s): "GLUCAP" in the last 72 hours.       IMAGING x48h  - image(s) personally visualized  -   highlighted in bold DG Chest Portable 1  View  Result Date: 06/21/2023 CLINICAL DATA:  hypoxia EXAM: PORTABLE CHEST 1 VIEW COMPARISON:  April 12, 2023 FINDINGS: Enlarged cardiomediastinal silhouette. Question increased conspicuity of the contours of the ascending thoracic aorta.Status post repair of the aortic arch. Tortuous thoracic aorta. No pleural effusion. No pneumothorax. No acute pleuroparenchymal abnormality. IMPRESSION: Question increased conspicuity of the contours of the ascending thoracic aorta. This could be due to rotation. Recommend dedicated PA and lateral chest radiograph for improved evaluation. If clinical concern for new aortic injury, then recommend dedicated CTA chest. Electronically Signed   By: Meda Klinefelter M.D.   On: 06/21/2023 17:12

## 2023-06-21 NOTE — Progress Notes (Signed)
Cross covering ICU physician.   Post op eval.   Pt VSS. On 6L TC.  Continue abx.

## 2023-06-21 NOTE — Progress Notes (Signed)
Pharmacy Antibiotic Note  Garrett Fleming is a 67 y.o. male admitted on 06/21/2023 presenting with infected laryngeal mass.  Pharmacy has been consulted for Unasyn dosing.  Plan: Unasyn 3g IV every 6 hours Monitor renal function, clinical progression and LOT     Temp (24hrs), Avg:98.4 F (36.9 C), Min:98.4 F (36.9 C), Max:98.4 F (36.9 C)  Recent Labs  Lab 06/21/23 1640 06/21/23 1655  WBC 12.2*  --   CREATININE 1.43*  --   LATICACIDVEN  --  2.0*    CrCl cannot be calculated (Unknown ideal weight.).    Allergies  Allergen Reactions   Lisinopril Swelling    Daylene Posey, PharmD, [facility] Clinical Pharmacist ED Pharmacist Phone # [phone] 06/21/2023 6:52 PM

## 2023-06-21 NOTE — ED Notes (Signed)
Intensivist at bedside evaluating pt

## 2023-06-21 NOTE — H&P (Signed)
06/21/2023 Please see consult note by Dr. Marchelle Gearing today.  Myrla Halsted MD PCCM

## 2023-06-21 NOTE — Op Note (Signed)
OPERATIVE NOTE  Garrett Fleming Date/Time of Admission: 06/21/2023  4:15 PM  CSN: [id number];WUJ:[id number] Attending Provider: Scarlette Ar, MD Room/Bed: OTFC/OTF DOB: [birth year] Age: 67 y.o.   Pre-Op Diagnosis: respiratory compromise Laryngeal squamous cell carcinoma   Post-Op Diagnosis: The same  Procedure: Procedure(s): EMERGENCY AWAKE TRACHEOSTOMY  Anesthesia: General  Surgeon(s): Mervin Kung, MD  Staff: Circulator: Fae Pippin, RN Scrub Person: Lake Bells R  Implants: * No implants in log *  Specimens: * No specimens in log *  Complications: none  EBL: 10 ML  IVF: Per anesthesai ML  Condition: stable  Operative Findings:  Fungating laryngeal squamous cell carcinoma draining pus through the anterior neck to the level of the cricoid  Tracheostomy performed between tracheal rings 2-3 with inferiorly based bjork flap - no evidence of tracheal tumor visualized   Frank pus in trachea evacuated with suction  Description of Operation:  The patient was brought directly from the ED to the operating room.  A preoperative timeout was performed.  The patient was transferred to the operating table.  The patient was secured on the bed left in semi-recumbent position due to being unable to tolerate supine position. A 3 cm incision was placed approximately 1 fingerbreadths above the sternal notch and infiltrated with lidocaine epinephrine.  The patient was prepped and draped in standard fashion for a procedure of this kind.  A final preoperative pause was performed and we proceeded with surgery.  15 blade was used to create a skin incision.Marland Kitchen  Next a vertical incision was created through the superficial cervical fascia until strap musculature was encountered.  The strap musculature raphae was divided and Army-Navy retractors were used to retract the strap muscles.  Next the cricoid cartilage was encountered.  FiO2 was decreased.  Bovie was used to incise  down to the cricoid and a tonsil dissector was used to release the thyroid isthmus off of the trachea.  The thyroid isthmus was divided with Bovie and bipolar cautery.  After division of the isthmus the trachea was cleansed with Kitners and the tracheal rings 1-3 exposed. [employer]-Navy retractors were applied for good exposure.  We began with preparation for an inferiorly based Bjork flap tracheostomy.  Next a 15 blade scalpel was used to incise tracheal rings 2 and 3 to create a inferiorly based Bjork flap 1 ring and height.  Curved Metzenbaum scissors were used to create the vertical cuts laterally.  A 3-0 Vicryl stitch on a taper needle was used to secure the port flap to the skin.  After this was done the ET tube was withdrawn and the Shiley 6-0 cuffed tracheostomy tube was inserted without difficulty.  The obturator was removed and an inner cannula placed.  The cuff was inflated and the tube was attached to the endotracheal tube circuit.  End-tidal CO2 was confirmed.  The tracheostomy tube was secured with 2-0 silk sutures as well as a tracheostomy tie.  The patient was then turned back to the anesthetist brought him back to the ICU in stable condition.  Plan: -ICU Admission  - Post-op CXR - Empiric abx for possible tumor superinfection vs. Pnemonia  - Anticipate admission until POD#5 for tracheostomy change - Patient will require follow up at [facility] for Radical laryngectomy, neck dissections and free tissue transfer  Mervin Kung, MD [facility] ENT  06/21/2023

## 2023-06-21 NOTE — Progress Notes (Addendum)
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  He emergently underwent awake tracheostomy in the setting of his laryngeal squamous cell carcinoma.  On presentation, the patient is hypertensive, otherwise normal vitals with 96% SpO2 on nonrebreather.  He returns from the OR on low-dose propofol at 20 mg.  Metabolic panel with mild elevated creatinine and anion gap metabolic acidosis.  Lactic acid of 2.  Mild leukocytosis.  Cultures been obtained.  Radiograph reviewed with increased aortic conspicuity of the arch but no parenchymal disease.  Postoperative chest radiograph pending.  eICU Interventions  Maintain antibiotics-vancomycin and Unasyn.  Transition from propofol to Precedex.  Intermittent fentanyl as needed.  Will follow repeat chest radiograph.  GI prophylaxis not indicated in the setting of tracheostomy, consider discontinuation of famotidine.  DVT prophylaxis with SCDs, can likely initiate heparin in a.m.   2110 -chest radiograph with evidence of pneumomediastinum and inability to exclude pneumothorax.  No respiratory distress, oxygenating well.  Will continue to monitor.  Repeat chest radiograph ordered for midnight.  Intervention Category Evaluation Type: New Patient Evaluation  Bexley Laubach 06/21/2023, 7:32 PM

## 2023-06-21 NOTE — ED Notes (Signed)
Pt transported to OR on stretcher w RN. Report called to OR staff. Pt verbalized understanding of plan at this time. Pt in gown and all belongings placed in bags and transported with pt at this time.

## 2023-06-22 ENCOUNTER — Inpatient Hospital Stay (HOSPITAL_COMMUNITY): Payer: Medicare HMO

## 2023-06-22 DIAGNOSIS — R0902 Hypoxemia: Secondary | ICD-10-CM | POA: Diagnosis not present

## 2023-06-22 LAB — PHOSPHORUS
Phosphorus: 4.8 mg/dL — ABNORMAL HIGH (ref 2.5–4.6)
Phosphorus: 3.7 mg/dL (ref 2.5–4.6)

## 2023-06-22 LAB — HIV ANTIBODY (ROUTINE TESTING W REFLEX): HIV Screen 4th Generation wRfx: NONREACTIVE

## 2023-06-22 LAB — BASIC METABOLIC PANEL
Anion gap: 9 (ref 5–15)
BUN: 42 mg/dL — ABNORMAL HIGH (ref 8–23)
CO2: 29 mmol/L (ref 22–32)
Calcium: 8.4 mg/dL — ABNORMAL LOW (ref 8.9–10.3)
Chloride: 99 mmol/L (ref 98–111)
Creatinine, Ser: 1.36 mg/dL — ABNORMAL HIGH (ref 0.61–1.24)
GFR, Estimated: 58 mL/min — ABNORMAL LOW (ref >60)
Glucose, Bld: 202 mg/dL — ABNORMAL HIGH (ref 70–99)
Potassium: 4.3 mmol/L (ref 3.5–5.1)
Sodium: 137 mmol/L (ref 135–145)

## 2023-06-22 LAB — GLUCOSE, CAPILLARY
Glucose-Capillary: 177 mg/dL — ABNORMAL HIGH (ref 70–99)
Glucose-Capillary: 123 mg/dL — ABNORMAL HIGH (ref 70–99)
Glucose-Capillary: 78 mg/dL (ref 70–99)
Glucose-Capillary: 91 mg/dL (ref 70–99)
Glucose-Capillary: 93 mg/dL (ref 70–99)

## 2023-06-22 LAB — CBC
HCT: 32.2 % — ABNORMAL LOW (ref 39.0–52.0)
Hemoglobin: 10.4 g/dL — ABNORMAL LOW (ref 13.0–17.0)
MCH: 30.1 pg (ref 26.0–34.0)
MCHC: 32.3 g/dL (ref 30.0–36.0)
MCV: 93.1 fL (ref 80.0–100.0)
Platelets: 264 10*3/uL (ref 150–400)
RBC: 3.46 MIL/uL — ABNORMAL LOW (ref 4.22–5.81)
RDW: 13.4 % (ref 11.5–15.5)
WBC: 13.7 10*3/uL — ABNORMAL HIGH (ref 4.0–10.5)
nRBC: 0 % (ref 0.0–0.2)

## 2023-06-22 LAB — MAGNESIUM
Magnesium: 2.5 mg/dL — ABNORMAL HIGH (ref 1.7–2.4)
Magnesium: 2.4 mg/dL (ref 1.7–2.4)

## 2023-06-22 MED ORDER — ACETAMINOPHEN 325 MG PO TABS
650.0000 mg | ORAL_TABLET | Freq: Four times a day (QID) | ORAL | Status: DC | PRN
  Administered 2023-06-22 – 2023-06-23 (×3): 650 mg
  Filled 2023-06-22 (×4): qty 2

## 2023-06-22 MED ORDER — DEXTROSE 5 % IV SOLN: INTRAVENOUS | Status: DC

## 2023-06-22 MED ORDER — OXYCODONE HCL 5 MG PO TABS
10.0000 mg | ORAL_TABLET | ORAL | Status: DC | PRN
  Administered 2023-06-22 – 2023-06-24 (×4): 10 mg
  Filled 2023-06-22 (×4): qty 2

## 2023-06-22 MED ORDER — DEXTROSE IN LACTATED RINGERS 5 % IV SOLN: INTRAVENOUS | Status: DC

## 2023-06-22 MED ORDER — THIAMINE MONONITRATE 100 MG PO TABS
100.0000 mg | ORAL_TABLET | Freq: Every day | ORAL | Status: AC
  Administered 2023-06-22 – 2023-06-26 (×5): 100 mg
  Filled 2023-06-22 (×5): qty 1

## 2023-06-22 MED ORDER — ENOXAPARIN SODIUM 40 MG/0.4ML IJ SOSY
40.0000 mg | PREFILLED_SYRINGE | INTRAMUSCULAR | Status: DC
  Administered 2023-06-22 – 2023-07-01 (×10): 40 mg via SUBCUTANEOUS
  Filled 2023-06-22 (×10): qty 0.4

## 2023-06-22 MED ORDER — PROSOURCE TF20 ENFIT COMPATIBL EN LIQD
60.0000 mL | Freq: Every day | ENTERAL | Status: DC
  Administered 2023-06-22 – 2023-07-06 (×14): 60 mL
  Filled 2023-06-22 (×14): qty 60

## 2023-06-22 MED ORDER — OSMOLITE 1.5 CAL PO LIQD
1000.0000 mL | ORAL | Status: DC
  Administered 2023-06-22 – 2023-07-05 (×6): 1000 mL
  Filled 2023-06-22 (×23): qty 1000

## 2023-06-22 NOTE — Progress Notes (Signed)
NAME:  Garrett Fleming, MRN:  [id number], DOB:  11-Aug-1956, LOS: 1 ADMISSION DATE:  06/21/2023, CONSULTATION DATE:  06/21/2023 REFERRING MD:  EDP, CHIEF COMPLAINT:  Respiratory distress    History of Present Illness:  This is a 67 year old male with a known history of laryngeal cancer presented for concerns of shortness of breath and respiratory distress.  Patient had emergent tracheostomy performed for upper airway obstruction secondary to malignancy.  Pertinent  Medical History   past medical history of Ankle fracture, left, Arthritis, Ascending aortic aneurysm (HCC) (01/21/2019), Blind right eye, Cocaine abuse (HCC) (11/17/2018), Coronary artery calcification seen on CAT scan (01/21/2019), Discomfort in chest, Essential hypertension (11/17/2018), Fracture of lateral malleolus of left ankle (12/02/2013), Keloid (11/16/2018), Penetrating atherosclerotic ulcer of aorta (HCC) (11/16/2018), and Smoking (11/17/2018)   Significant Hospital Events: Including procedures, antibiotic start and stop dates in addition to other pertinent events   06/21/2023: Admit with emergent Trach   Interim History / Subjective:  Overnight no acute events.  Patient remained stable.  Chest x-ray did show evidence of pneumomediastinum and inability to exclude pneumothorax.  No respiratory distress patient oxygenating well.  Did order repeat x-ray.  Did order repeat x-ray.  Patient evaluated at bedside this morning.  Patient reports he is doing well.  Does report some pain at the tracheostomy site.  He denies any other concerns.  He denies any shortness of breath.  Objective   Blood pressure 95/63, pulse (!) 52, temperature 98.7 F (37.1 C), temperature source Oral, resp. rate 13, weight 56.9 kg, SpO2 97%.    FiO2 (%):  [40 %] 40 %   Intake/Output Summary (Last 24 hours) at 06/22/2023 0740 Last data filed at 06/22/2023 9528 Gross per 24 hour  Intake 1904.74 ml  Output 50 ml  Net 1854.74 ml   Filed Weights   06/21/23  1838 06/22/23 0409  Weight: 70 kg 56.9 kg    Examination: General: Resting in bed, no acute distress HENT: Tracheostomy in place Lungs: Clear to auscultation bilaterally  Cardiovascular: Regular rate and rhythm Abdomen: Soft, nontender Extremities: Able to move all extremities freely Neuro: Alert and oriented x 3, able to follow all instructions  Resolved Hospital Problem list   Lactic acidosis  Assessment & Plan:  67 year old male with a history of larynx cancer presenting for respiratory distress and acute hypoxemic respiratory failure.  Patient is status post tracheostomy.  Cells with features diagnostic of malignancy are not seen in this biopsy. If clinically a neoplastic process is still highly suspected, rebiopsy of the lesion to obtain more viable tissue is recommended.   #Acute hypoxemic respiratory failure secondary to obstruction from larynx mass  #Status post tracheostomy Patient is status post tracheostomy.  Wean to trach collar today.  Patient is able to walk around the unit with no concerns.  Patient is stable from respiratory distress standpoint.  Obstruction has been bypassed.  Did do chart review, and chart review showed that patient had biopsy of larynx mass which was consistent with squamous cell carcinoma.  Will need to be followed up with ENT outpatient.  Plan for laryngectomy with [facility] per ENT. -Safe to transfer to floor -Continue trach collar, continue to wean off trach collar -Monitor for worsening respiratory distress -ENT following for trach care -Plan to trach change on postop day 5, today's day 1 -Start PEG tube feeds today  #Concern for aspiration pneumonia Patient started on Unasyn.  Today is day 2 of Unasyn.  Will continue for total 5-day course  of Unasyn  #AKI Patient likely had AKI from underlying dehydration.  Creatinine is improving to 1.36. -Continue to monitor BMP  #Normocytic anemia Patient has normocytic anemia.  This likely  secondary to chronic disease.  Patient also had acute blood loss from yesterday's procedure, which could be contributing. -Continue to monitor CBC  #Leukocytosis Likely secondary to steroids as well as reactive or underlying infection. -Continue monitor CBC  #Hypertension Patient has a history of hypertension.  On amlodipine 10 mg daily as well as carvedilol 3.125 mg twice daily.  Will hold these at this time.  Best Practice (right click and "Reselect all SmartList Selections" daily)   Diet/type: tubefeeds DVT prophylaxis: SCD GI prophylaxis: PPI Lines: N/A Foley:  N/A Code Status:  full code  Labs   CBC: Recent Labs  Lab 06/21/23 1640 06/22/23 0358  WBC 12.2* 13.7*  NEUTROABS 10.3*  --   HGB 14.3 10.4*  HCT 43.7 32.2*  MCV 93.8 93.1  PLT 390 264    Basic Metabolic Panel: Recent Labs  Lab 06/21/23 1640 06/22/23 0358  NA 140 137  K 4.1 4.3  CL 98 99  CO2 25 29  GLUCOSE 140* 202*  BUN 45* 42*  CREATININE 1.43* 1.36*  CALCIUM 9.4 8.4*  MG  --  2.5*  PHOS  --  4.8*   GFR: Estimated Creatinine Clearance: 43.6 mL/min (A) (by C-G formula based on SCr of 1.36 mg/dL (H)). Recent Labs  Lab 06/21/23 1640 06/21/23 1655 06/22/23 0358  WBC 12.2*  --  13.7*  LATICACIDVEN  --  2.0*  --     Liver Function Tests: Recent Labs  Lab 06/21/23 1640  AST 23  ALT 32  ALKPHOS 97  BILITOT 1.5*  PROT 7.5  ALBUMIN 3.7   No results for input(s): "LIPASE", "AMYLASE" in the last 168 hours. No results for input(s): "AMMONIA" in the last 168 hours.  ABG No results found for: "PHART", "PCO2ART", "PO2ART", "HCO3", "TCO2", "ACIDBASEDEF", "O2SAT"   Coagulation Profile: No results for input(s): "INR", "PROTIME" in the last 168 hours.  Cardiac Enzymes: No results for input(s): "CKTOTAL", "CKMB", "CKMBINDEX", "TROPONINI" in the last 168 hours.  HbA1C: Hgb A1c MFr Bld  Date/Time Value Ref Range Status  11/18/2018 02:58 AM 4.9 4.8 - 5.6 % Final    Comment:    (NOTE)          Prediabetes: 5.7 - 6.4         Diabetes: >6.4         Glycemic control for adults with diabetes: <7.0     CBG: Recent Labs  Lab 06/21/23 2005  GLUCAP 153*    Review of Systems:   Negative except for what is stated in HPI  Past Medical History:  He,  has a past medical history of Ankle fracture, left, Arthritis, Ascending aortic aneurysm (HCC) (01/21/2019), Blind right eye, Cocaine abuse (HCC) (11/17/2018), Coronary artery calcification seen on CAT scan (01/21/2019), Discomfort in chest, Essential hypertension (11/17/2018), Fracture of lateral malleolus of left ankle (12/02/2013), Keloid (11/16/2018), Penetrating atherosclerotic ulcer of aorta (HCC) (11/16/2018), and Smoking (11/17/2018).   Surgical History:   Past Surgical History:  Procedure Laterality Date   CAROTID-SUBCLAVIAN BYPASS GRAFT Left 11/26/2018   Procedure: LEFT CAROTID TO SUBCLAVIAN ARTERY TRANSPOSITION;  Surgeon: Nada Libman, MD;  Location: [facility] OR;  Service: Vascular;  Laterality: Left;   KENALOG INJECTION Right 04/25/2020   Procedure: kenalog injection to right shoulder keloid;  Surgeon: Peggye Form, DO;  Location: [facility];  Service: Plastics;  Laterality: Right;   LESION REMOVAL Left 04/25/2020   Procedure: Excision of keloid/changing skin lesion of scalp;  Surgeon: Peggye Form, DO;  Location: [facility];  Service: Plastics;  Laterality: Left;  1 hour total, please   ORIF ANKLE FRACTURE Left 12/02/2013   Procedure: OPEN REDUCTION INTERNAL FIXATION (ORIF) LEFT ANKLE FRACTURE;  Surgeon: Kathryne Hitch, MD;  Location: [facility];  Service: Orthopedics;  Laterality: Left;   right eye surgery   1977   THORACIC AORTIC ENDOVASCULAR STENT GRAFT N/A 11/26/2018   Procedure: THORACIC AORTIC ENDOVASCULAR STENT using a GORE TAG CONFORMABLE THORACIC GRAFT;  Surgeon: Nada Libman, MD;  Location: [facility] OR;  Service: Vascular;  Laterality: N/A;   ULTRASOUND GUIDANCE FOR VASCULAR ACCESS  Bilateral 11/26/2018   Procedure: Ultrasound Guidance For Vascular Access;  Surgeon: Nada Libman, MD;  [facility];  Service: Vascular;  Laterality: Bilateral;     Social History:   reports that he has been smoking cigarettes. He has a 5 pack-year smoking history. He has never used smokeless tobacco. He reports current alcohol use. He reports current drug use. Drugs: Cocaine and Marijuana.   Family History:  His family history is not on file.   Allergies Allergies  Allergen Reactions   Lisinopril Swelling     Home Medications  Prior to Admission medications   Medication Sig Start Date End Date Taking? Authorizing Provider  amLODipine (NORVASC) 10 MG tablet Take 1 tablet by mouth daily. 05/13/23 05/12/24 Yes [provider]  carvedilol (COREG) 3.125 MG tablet Take 3.125 mg by mouth 2 (two) times daily with a meal. 1 tablet in the Morning, and 1 tablet in the Evening. 05/12/23 05/11/24 Yes [provider]  dexamethasone (DECADRON) 4 MG tablet Take 4 mg by mouth daily. 05/12/23  Yes [provider]  oxyCODONE (ROXICODONE) 5 MG/5ML solution Place 5 mg into feeding tube every 4 (four) hours as needed for moderate pain or severe pain. 05/12/23  Yes [provider]  lisinopril (ZESTRIL) 10 MG tablet Take 1 tablet (10 mg total) by mouth daily. 10/19/19 02/13/20  Quintella Reichert, MD     Critical care time: 40 mins     Modena Slater, DO Internal Medicine Resident PGY-2 Pager: [phone]

## 2023-06-22 NOTE — Progress Notes (Signed)
SLP Cancellation Note  Patient Details Name: Garrett Fleming MRN: [id number] DOB: April 22, 1956   Cancelled treatment:       Reason Eval/Treat Not Completed: Patient not medically ready. Patient with new tracheostomy on previous date. Orders for SLP eval and treat for PMSV and swallowing received. Will follow pt closely for readiness for SLP interventions as appropriate.      Mahala Menghini., M.A. CCC-SLP Acute Rehabilitation Services Office [phone]  Secure chat preferred  06/22/2023, 7:44 AM

## 2023-06-22 NOTE — Evaluation (Signed)
Physical Therapy Evaluation Patient Details Name: Garrett Fleming MRN: [id number] DOB: [birth year] Today's Date: 06/22/2023  History of Present Illness  67 y.o. male admitted 9/22 with hypoxemic respiratory failure due to airway stenosis with infection s/p emergent awake tracheostomy. PMxh: larynx CA, left ankle fx, HTN, smoker, cocaine abuse, aortic aneurysm, blind Rt eye, PEG  Clinical Impression  Pt pleasant mouthing and gesturing responses with wife present to provide PLOF and home setup. Pt living in an RV without full bathroom with wife stating working on plans to change housing situation. Pt normally independent and walks without AD. Pt able to maintain SPO2 on 30% FIO2 throughout with pt limited by secretions and need for suctioning. Pt moving well without need for AD and will benefit from acute therapy to maximize pulmonary tolerance and mobility for return home. Encouraged OOb and walking with staff daily.   HR 53-58 SPO2 95-98% on 30% FIO2 trach collar      If plan is discharge home, recommend the following: Assistance with cooking/housework;A little help with bathing/dressing/bathroom   Can travel by private vehicle        Equipment Recommendations None recommended by PT  Recommendations for Other Services       Functional Status Assessment Patient has had a recent decline in their functional status and demonstrates the ability to make significant improvements in function in a reasonable and predictable amount of time.     Precautions / Restrictions Precautions Precautions: Fall;Other (comment) Precaution Comments: PEG, trach      Mobility  Bed Mobility Overal bed mobility: Needs Assistance Bed Mobility: Supine to Sit     Supine to sit: Supervision     General bed mobility comments: supervision for lines and safety    Transfers Overall transfer level: Needs assistance   Transfers: Sit to/from Stand Sit to Stand: Contact guard assist            General transfer comment: CGA for lines and safety with pt able to rise from bed and lower to recliner    Ambulation/Gait Ambulation/Gait assistance: Contact guard assist Gait Distance (Feet): 150 Feet Assistive device: None Gait Pattern/deviations: Step-through pattern, Decreased stride length   Gait velocity interpretation: 1.31 - 2.62 ft/sec, indicative of limited community ambulator   General Gait Details: 2 standing rests due to coughing. Pt maintaining >93% on 30% fio2  Stairs            Wheelchair Mobility     Tilt Bed    Modified Rankin (Stroke Patients Only)       Balance Overall balance assessment: Mild deficits observed, not formally tested                                           Pertinent Vitals/Pain Pain Assessment Pain Assessment: 0-10 Pain Score: 9  Pain Location: neck- incision Pain Descriptors / Indicators: Aching, Sore Pain Intervention(s): Limited activity within patient's tolerance, Repositioned, Monitored during session    Home Living Family/patient expects to be discharged to:: Private residence Living Arrangements: Spouse/significant other Available Help at Discharge: Family;Available 24 hours/day Type of Home: Other(Comment) (RV) Home Access: Stairs to enter   Entrance Stairs-Number of Steps: 3   Home Layout: One level Home Equipment: None Additional Comments: pt and wife living in an RV, sponge bathing due to lack of shower. Wife states they walk to toilet (? bath house) wife uses rollator  Prior Function Prior Level of Function : Independent/Modified Independent                     Extremity/Trunk Assessment   Upper Extremity Assessment Upper Extremity Assessment: Generalized weakness    Lower Extremity Assessment Lower Extremity Assessment: Generalized weakness    Cervical / Trunk Assessment Cervical / Trunk Assessment: Normal  Communication   Communication Communication: Tracheostomy   Cognition Arousal: Alert Behavior During Therapy: WFL for tasks assessed/performed Overall Cognitive Status: Within Functional Limits for tasks assessed                                          General Comments      Exercises     Assessment/Plan    PT Assessment Patient needs continued PT services  PT Problem List Decreased activity tolerance;Cardiopulmonary status limiting activity       PT Treatment Interventions DME instruction;Gait training;Stair training;Functional mobility training;Therapeutic activities;Patient/family education    PT Goals (Current goals can be found in the Care Plan section)  Acute Rehab PT Goals Patient Stated Goal: return home PT Goal Formulation: With patient/family Time For Goal Achievement: 07/06/23 Potential to Achieve Goals: Good    Frequency Min 1X/week     Co-evaluation               AM-PAC PT "6 Clicks" Mobility  Outcome Measure Help needed turning from your back to your side while in a flat bed without using bedrails?: None Help needed moving from lying on your back to sitting on the side of a flat bed without using bedrails?: None Help needed moving to and from a bed to a chair (including a wheelchair)?: A Little Help needed standing up from a chair using your arms (e.g., wheelchair or bedside chair)?: A Little Help needed to walk in hospital room?: A Little Help needed climbing 3-5 steps with a railing? : A Little 6 Click Score: 20    End of Session Equipment Utilized During Treatment: Oxygen Activity Tolerance: Patient tolerated treatment well Patient left: in chair;with call bell/phone within reach;with family/visitor present Nurse Communication: Mobility status PT Visit Diagnosis: Other abnormalities of gait and mobility (R26.89)    Time: 1040-1104 PT Time Calculation (min) (ACUTE ONLY): 24 min   Charges:   PT Evaluation $PT Eval Moderate Complexity: 1 Mod   PT General Charges $$ ACUTE PT  VISIT: 1 Visit         Merryl Hacker, PT Acute Rehabilitation Services Office: [phone]   Enedina Finner Jacee Enerson 06/22/2023, 11:55 AM

## 2023-06-22 NOTE — Progress Notes (Signed)
ENT Postop Note  ID: postop day once that emergent awake tracheostomy for hypoxemic, respiratory failure, and setting of advanced laryngal cancer  S: doing well. Much more comfortable with tracheostomy. Significant other Geraldine at bedside. Stitch they have not been contacted for myectomy scheduling at [facility] and I've also had issues with feeding tube supplies through [facility]. The patient is eager to proceed with laryngectomy surgery.  O: resting comfortably and no acute distress. Garrett Fleming 62 tracheostomy tube secured with soft collar and silk ties. Cough deflated. Anterior Neck, tumor invading skin with purulent drainage.  A/P: postop day, one status/post emergent to awake tracheostomy. Doing well. Plan for tracheostomy change post op day five  Routine trach care DME coordination  Coordinate with wake [facility] (Dr Hezzie Bump) for laryngectomy / free flap   Scarlette Ar MD

## 2023-06-22 NOTE — Progress Notes (Signed)
Initial Nutrition Assessment  DOCUMENTATION CODES:   Severe malnutrition in context of chronic illness  INTERVENTION:  - Per CCM, can start tube feeds today. Recommend continuous tube feeds due to risk of refeeding and for better tolerance.  Initiate tube feeding via PEG: Osmolite 1.5 at 60 ml/h (1440 ml per day) *Start at 24mL/hr and advance by 10mL Q12H  Prosource TF20 60 ml daily Provides 2240 kcal, 110 gm protein, 1097 ml free water daily  - Monitor magnesium, potassium, and phosphorus BID for at least 3 days, MD to replete as needed, as pt is at risk for refeeding syndrome given severe malnutrition. - 100mg  thiamine x5 days due to refeeding risk.    - Discontinue D5 now that starting tube feeds.  - Consider Q4H FWF when appropriate.  - Monitor weight trends.   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  GOAL:   Patient will meet greater than or equal to 90% of their needs  MONITOR:   Labs, Weight trends, TF tolerance, I & O's  REASON FOR ASSESSMENT:   Consult Enteral/tube feeding initiation and management  ASSESSMENT:    67 y.o. male with extreme failure to thrive who initially presented 05/04/2023 with a laryngeal mass pressing into the airway. He then went to [facility] in [place] and admitted there. He had a PEG tube placed on 05/07/2023 he was treated with antibiotics and Decadron and discharged. Presented back to [facility] 06/21/2023 with worsening shortness of breath. 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   9/22: Admit; s/p trach  Patient in bed at time of visit, wife at bedside. Patient reported a UBW of 180# and steady weight loss since August 2023.  Available weight history as below: 06/22/23 56.9 kg  05/04/23 76.7 kg  04/12/23 98.8 kg  04/25/20 (!) 93.8 kg   Suspect weight on 04/12/23 is not accurate given  patient's report. If weight in August is accurate, this would be a 43# or 25% weight loss in 7 weeks, which would be severe and significant.   Wife detailed more of patient's nutrition intake. She reports he had the PEG placed on 8/8. After discharge, he has only been getting 2 cartons a day of jevity 1.5 (720 kcals, 30g protein). Gets each carton via syringe. Wife reports patient was supposed to be getting 7 cartons a day but that the patient had a lot of loose stools after tube feeds so would refuse to get more than 2 per day. She also notes he has been eating foods/drinks orally as well. Shares he had fried chicken one day.   Patient is now s/p trach placement this admission. Per CCM MD Dr. Denese Killings, can start tube feeds today. MD okay with discontinuing  D5 now that starting tube feeds.  VERY concerned about refeeding syndrome due to severe muscle and fat wasting, reports of significant weight loss, and very little nutrition intake PTA. Will start tube feeds at 16mL/hr and advance by 10mL Q12H and add thiamine. Discussed with RN.   Medications reviewed and include: Colace, Miralax, D5 @ 52mL/hr (provides 306 kcals over 24 hours)  Labs reviewed:  Creatinine 1.36 Phosphorus 4.8 Magnesium 2.5   NUTRITION - FOCUSED PHYSICAL EXAM:  Flowsheet Row Most Recent Value  Orbital Region Severe depletion  Upper Arm Region Severe depletion  Thoracic and Lumbar Region Severe depletion  Buccal Region Severe depletion  Temple Region Severe  depletion  Clavicle Bone Region Severe depletion  Clavicle and Acromion Bone Region Severe depletion  Scapular Bone Region Unable to assess  Dorsal Hand Severe depletion  Patellar Region Severe depletion  Anterior Thigh Region Severe depletion  Posterior Calf Region Severe depletion  Edema (RD Assessment) None  Hair Reviewed  Eyes Reviewed  Mouth Reviewed  Skin Reviewed  Nails Reviewed       Diet Order:   Diet Order             Diet NPO time  specified  Diet effective now                   EDUCATION NEEDS:  Education needs have been addressed  Skin:  Skin Assessment: Reviewed RN Assessment  Last BM:  unknown  Height:  Ht Readings from Last 1 Encounters:  05/04/23 6\' 4"  (1.93 m)   Weight:  Wt Readings from Last 1 Encounters:  06/22/23 56.9 kg    BMI:  Body mass index is 15.27 kg/m.  Estimated Nutritional Needs:  Kcal:  2000-2300 kcals Protein:  95-115 grams Fluid:  >/= 2L    Shelle Iron RD, LDN For contact information, refer to [facility].

## 2023-06-23 DIAGNOSIS — Z430 Encounter for attention to tracheostomy: Secondary | ICD-10-CM

## 2023-06-23 LAB — CBC
HCT: 35.1 % — ABNORMAL LOW (ref 39.0–52.0)
Hemoglobin: 11.4 g/dL — ABNORMAL LOW (ref 13.0–17.0)
MCH: 31.6 pg (ref 26.0–34.0)
MCHC: 32.5 g/dL (ref 30.0–36.0)
MCV: 97.2 fL (ref 80.0–100.0)
Platelets: 257 10*3/uL (ref 150–400)
RBC: 3.61 MIL/uL — ABNORMAL LOW (ref 4.22–5.81)
RDW: 13.7 % (ref 11.5–15.5)
WBC: 10.3 10*3/uL (ref 4.0–10.5)
nRBC: 0 % (ref 0.0–0.2)

## 2023-06-23 LAB — COMPREHENSIVE METABOLIC PANEL
ALT: 26 U/L (ref 0–44)
AST: 20 U/L (ref 15–41)
Albumin: 2.9 g/dL — ABNORMAL LOW (ref 3.5–5.0)
Alkaline Phosphatase: 71 U/L (ref 38–126)
Anion gap: 11 (ref 5–15)
BUN: 36 mg/dL — ABNORMAL HIGH (ref 8–23)
CO2: 26 mmol/L (ref 22–32)
Calcium: 8.4 mg/dL — ABNORMAL LOW (ref 8.9–10.3)
Chloride: 102 mmol/L (ref 98–111)
Creatinine, Ser: 1.14 mg/dL (ref 0.61–1.24)
GFR, Estimated: >60 mL/min (ref >60)
Glucose, Bld: 111 mg/dL — ABNORMAL HIGH (ref 70–99)
Potassium: 3.7 mmol/L (ref 3.5–5.1)
Sodium: 139 mmol/L (ref 135–145)
Total Bilirubin: 1 mg/dL (ref 0.3–1.2)
Total Protein: 6.1 g/dL — ABNORMAL LOW (ref 6.5–8.1)

## 2023-06-23 LAB — GLUCOSE, CAPILLARY
Glucose-Capillary: 95 mg/dL (ref 70–99)
Glucose-Capillary: 105 mg/dL — ABNORMAL HIGH (ref 70–99)
Glucose-Capillary: 94 mg/dL (ref 70–99)
Glucose-Capillary: 106 mg/dL — ABNORMAL HIGH (ref 70–99)
Glucose-Capillary: 127 mg/dL — ABNORMAL HIGH (ref 70–99)
Glucose-Capillary: 95 mg/dL (ref 70–99)

## 2023-06-23 LAB — PHOSPHORUS
Phosphorus: 2.5 mg/dL (ref 2.5–4.6)
Phosphorus: 2.8 mg/dL (ref 2.5–4.6)

## 2023-06-23 LAB — MAGNESIUM
Magnesium: 2.3 mg/dL (ref 1.7–2.4)
Magnesium: 2.2 mg/dL (ref 1.7–2.4)

## 2023-06-23 MED ORDER — HYDRALAZINE HCL 20 MG/ML IJ SOLN: 10.0000 mg | INTRAMUSCULAR | Status: DC | PRN

## 2023-06-23 MED ORDER — AMLODIPINE BESYLATE 5 MG PO TABS
5.0000 mg | ORAL_TABLET | Freq: Every day | ORAL | Status: DC
  Administered 2023-06-23 – 2023-07-05 (×12): 5 mg
  Filled 2023-06-23 (×12): qty 1

## 2023-06-23 NOTE — Anesthesia Postprocedure Evaluation (Signed)
Anesthesia Post Note  Patient: Garrett Fleming  Procedure(s) Performed: TRACHEOSTOMY     Patient location during evaluation: PACU Anesthesia Type: MAC Level of consciousness: awake and alert Pain management: pain level controlled Vital Signs Assessment: post-procedure vital signs reviewed and stable Respiratory status: spontaneous breathing, nonlabored ventilation, respiratory function stable and patient connected to nasal cannula oxygen Cardiovascular status: stable and blood pressure returned to baseline Postop Assessment: no apparent nausea or vomiting Anesthetic complications: no   No notable events documented.  Last Vitals:  Vitals:   06/23/23 0800 06/23/23 0811  BP: (!) 156/80   Pulse: 62 65  Resp: 15 16  Temp:    SpO2: 98% 98%    Last Pain:  Vitals:   06/23/23 0800  TempSrc:   PainSc: 8                  Kinney Sackmann S

## 2023-06-23 NOTE — Progress Notes (Signed)
Patient is getting ready to move to room 5W26.

## 2023-06-23 NOTE — Plan of Care (Signed)
Problem: Education: Goal: Knowledge about tracheostomy care/management will improve Outcome: Progressing   Problem: Activity: Goal: Ability to tolerate increased activity will improve Outcome: Progressing   Problem: Health Behavior/Discharge Planning: Goal: Ability to manage tracheostomy will improve Outcome: Progressing   Problem: Respiratory: Goal: Patent airway maintenance will improve Outcome: Progressing   Problem: Role Relationship: Goal: Ability to communicate will improve Outcome: Progressing   Problem: Education: Goal: Knowledge of General Education information will improve Description: Including pain rating scale, medication(s)/side effects and non-pharmacologic comfort measures Outcome: Progressing   Problem: Health Behavior/Discharge Planning: Goal: Ability to manage health-related needs will improve Outcome: Progressing   Problem: Clinical Measurements: Goal: Ability to maintain clinical measurements within normal limits will improve Outcome: Progressing Goal: Will remain free from infection Outcome: Progressing Goal: Diagnostic test results will improve Outcome: Progressing Goal: Respiratory complications will improve Outcome: Progressing Goal: Cardiovascular complication will be avoided Outcome: Progressing   Problem: Activity: Goal: Risk for activity intolerance will decrease Outcome: Progressing   Problem: Nutrition: Goal: Adequate nutrition will be maintained Outcome: Progressing   Problem: Coping: Goal: Level of anxiety will decrease Outcome: Progressing   Problem: Elimination: Goal: Will not experience complications related to bowel motility Outcome: Progressing Goal: Will not experience complications related to urinary retention Outcome: Progressing   Problem: Pain Managment: Goal: General experience of comfort will improve Outcome: Progressing   Problem: Safety: Goal: Ability to remain free from injury will improve Outcome:  Progressing   Problem: Skin Integrity: Goal: Risk for impaired skin integrity will decrease Outcome: Progressing
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
## 2023-06-23 NOTE — Progress Notes (Signed)
PROGRESS NOTE    Garrett Fleming  ZOX:[id number] DOB: 1956/05/11 DOA: 06/21/2023 PCP: Quintella Reichert, MD   Brief Narrative:  67 year old male with history of hypertension, recent diagnosis of laryngeal cancer, needing PEG tube placement for feeding (had laryngeal biopsy and PEG tube placement in early August 2024 at [facility]) presented with respiratory distress and required emergent tracheostomy on 06/21/2023 by ENT for upper airway obstruction.  Patient was initially admitted to ICU under PCCM service.  He has been transferred to [facility] service from 06/23/2023 onwards.  Assessment & Plan:   Acute respiratory failure with hypoxia secondary to obstruction from laryngeal mass status post tracheostomy Recent diagnosis of laryngeal cancer -Presented in acute respiratory distress and required emergent tracheostomy by ENT on 06/21/2023. -initially admitted to ICU under PCCM service.  He has been transferred to [facility] service from 06/23/2023 onwards. Janina Mayo care as per ENT: ENT planning on trach change on postop day 5. -Respiratory status improving: Currently on 6 L oxygen via trach.  Wean off as able.  Concern for aspiration pneumonia -Currently on Unasyn.  Continue 5-day course of Unasyn  AKI -Possibly from dehydration.  Treated with IV fluids.  DC IV fluids.  Improved.  Leukocytosis Resolved  Anemia of chronic disease -From chronic illnesses.  Hemoglobin stable.  Monitor intermittently  Dysphagia Severe malnutrition -Continue PEG tube feeding as per dietary recommendations  Hypertension -Blood pressure currently stable.  Continue reduced dose of amlodipine   DVT prophylaxis: Lovenox Code Status: Full Family Communication: Wife sleeping at bedside Disposition Plan: Status is: Inpatient Remains inpatient appropriate because: Of severity of illness  Consultants: ENT/PCCM  Procedures: As above  Antimicrobials: Unasyn from 06/21/2023 onwards   Subjective: Patient seen  and examined at bedside.  Denies worsening shortness of breath, fever, vomiting.   Objective: Vitals:   06/23/23 0600 06/23/23 0700 06/23/23 0731 06/23/23 0811  BP: 132/80 124/75    Pulse: 66 60  65  Resp: 15 14  16   Temp:   98.6 F (37 C)   TempSrc:   Oral   SpO2: 96% 97%  98%  Weight:        Intake/Output Summary (Last 24 hours) at 06/23/2023 0905 Last data filed at 06/23/2023 0700 Gross per 24 hour  Intake 2064.92 ml  Output 1025 ml  Net 1039.92 ml   Filed Weights   06/21/23 1838 06/22/23 0409 06/23/23 0430  Weight: 70 kg 56.9 kg 57.5 kg    Examination:  General exam: Appears calm and comfortable.  Looks chronically ill and deconditioned.  On 6 L oxygen via trach ENT: Trach present.   Respiratory system: Bilateral decreased breath sounds at bases with scattered crackles Cardiovascular system: S1 & S2 heard, Rate controlled Gastrointestinal system: Abdomen is nondistended, soft and nontender. Normal bowel sounds heard.  PEG tube present. Extremities: No cyanosis, clubbing, edema  Central nervous system: Alert and oriented. No focal neurological deficits. Moving extremities Skin: No rashes, lesions or ulcers Psychiatry: Flat affect.  Not agitated.   Data Reviewed: I have personally reviewed following labs and imaging studies  CBC: Recent Labs  Lab 06/21/23 1640 06/22/23 0358 06/23/23 0509  WBC 12.2* 13.7* 10.3  NEUTROABS 10.3*  --   --   HGB 14.3 10.4* 11.4*  HCT 43.7 32.2* 35.1*  MCV 93.8 93.1 97.2  PLT 390 264 257   Basic Metabolic Panel: Recent Labs  Lab 06/21/23 1640 06/22/23 0358 06/22/23 2002 06/23/23 0509  NA 140 137  --  139  K 4.1  4.3  --  3.7  CL 98 99  --  102  CO2 25 29  --  26  GLUCOSE 140* 202*  --  111*  BUN 45* 42*  --  36*  CREATININE 1.43* 1.36*  --  1.14  CALCIUM 9.4 8.4*  --  8.4*  MG  --  2.5* 2.4 2.3  PHOS  --  4.8* 3.7 2.5   GFR: Estimated Creatinine Clearance: 52.5 mL/min (by C-G formula based on SCr of 1.14  mg/dL). Liver Function Tests: Recent Labs  Lab 06/21/23 1640 06/23/23 0509  AST 23 20  ALT 32 26  ALKPHOS 97 71  BILITOT 1.5* 1.0  PROT 7.5 6.1*  ALBUMIN 3.7 2.9*   No results for input(s): "LIPASE", "AMYLASE" in the last 168 hours. No results for input(s): "AMMONIA" in the last 168 hours. Coagulation Profile: No results for input(s): "INR", "PROTIME" in the last 168 hours. Cardiac Enzymes: No results for input(s): "CKTOTAL", "CKMB", "CKMBINDEX", "TROPONINI" in the last 168 hours. BNP (last 3 results) No results for input(s): "PROBNP" in the last 8760 hours. HbA1C: No results for input(s): "HGBA1C" in the last 72 hours. CBG: Recent Labs  Lab 06/22/23 1546 06/22/23 1954 06/22/23 2329 06/23/23 0350 06/23/23 0730  GLUCAP 78 91 93 95 105*   Lipid Profile: No results for input(s): "CHOL", "HDL", "LDLCALC", "TRIG", "CHOLHDL", "LDLDIRECT" in the last 72 hours. Thyroid Function Tests: No results for input(s): "TSH", "T4TOTAL", "FREET4", "T3FREE", "THYROIDAB" in the last 72 hours. Anemia Panel: No results for input(s): "VITAMINB12", "FOLATE", "FERRITIN", "TIBC", "IRON", "RETICCTPCT" in the last 72 hours. Sepsis Labs: Recent Labs  Lab 06/21/23 1655  LATICACIDVEN 2.0*    Recent Results (from the past 240 hour(s))  Resp panel by RT-PCR (RSV, Flu A&B, Covid) Anterior Nasal Swab     Status: None   Collection Time: 06/21/23  4:32 PM   Specimen: Anterior Nasal Swab  Result Value Ref Range Status   SARS Coronavirus 2 by RT PCR NEGATIVE NEGATIVE Final   Influenza A by PCR NEGATIVE NEGATIVE Final   Influenza B by PCR NEGATIVE NEGATIVE Final    Comment: (NOTE) The Xpert Xpress SARS-CoV-2/FLU/RSV plus assay is intended as an aid in the diagnosis of influenza from Nasopharyngeal swab specimens and should not be used as a sole basis for treatment. Nasal washings and aspirates are unacceptable for Xpert Xpress SARS-CoV-2/FLU/RSV testing.  Fact Sheet for  Patients: BloggerCourse.com  Fact Sheet for Healthcare Providers: SeriousBroker.it  This test is not yet approved or cleared by the [place] FDA and has been authorized for detection and/or diagnosis of SARS-CoV-2 by FDA under an Emergency Use Authorization (EUA). This EUA will remain in effect (meaning this test can be used) for the duration of the COVID-19 declaration under Section 564(b)(1) of the Act, 21 U.S.C. section 360bbb-3(b)(1), unless the authorization is terminated or revoked.     Resp Syncytial Virus by PCR NEGATIVE NEGATIVE Final    Comment: (NOTE) Fact Sheet for Patients: BloggerCourse.com  Fact Sheet for Healthcare Providers: SeriousBroker.it  This test is not yet approved or cleared by the [place] FDA and has been authorized for detection and/or diagnosis of SARS-CoV-2 by FDA under an Emergency Use Authorization (EUA). This EUA will remain in effect (meaning this test can be used) for the duration of the COVID-19 declaration under Section 564(b)(1) of the Act, 21 U.S.C. section 360bbb-3(b)(1), unless the authorization is terminated or revoked.  Performed at [facility], [address], [place], [place]  [address]   Blood culture (routine x 2)     Status: None (Preliminary result)   Collection Time: 06/21/23  4:40 PM   Specimen: BLOOD  Result Value Ref Range Status   Specimen Description BLOOD SITE NOT SPECIFIED  Final   Special Requests   Final    BOTTLES DRAWN AEROBIC AND ANAEROBIC Blood Culture results may not be optimal due to an excessive volume of blood received in culture bottles   Culture   Final    NO GROWTH 2 DAYS Performed at [facility], [address], [place], [place] [address]    Report Status PENDING  Incomplete  Blood culture (routine x 2)     Status: None (Preliminary result)   Collection Time: 06/21/23  4:55 PM    Specimen: BLOOD  Result Value Ref Range Status   Specimen Description BLOOD SITE NOT SPECIFIED  Final   Special Requests   Final    BOTTLES DRAWN AEROBIC AND ANAEROBIC Blood Culture adequate volume   Culture   Final    NO GROWTH 2 DAYS Performed at [facility], [address], [place], [place] [address]    Report Status PENDING  Incomplete  MRSA Next Gen by PCR, Nasal     Status: None   Collection Time: 06/21/23  8:20 PM   Specimen: Nasal Mucosa; Nasal Swab  Result Value Ref Range Status   MRSA by PCR Next Gen NOT DETECTED NOT DETECTED Final    Comment: (NOTE) The GeneXpert MRSA Assay (FDA approved for NASAL specimens only), is one component of a comprehensive MRSA colonization surveillance program. It is not intended to diagnose MRSA infection nor to guide or monitor treatment for MRSA infections. Test performance is not FDA approved in patients less than 78 years old. Performed at [facility], [address] [place], [place] [address]          Radiology Studies: DG CHEST PORT 1 VIEW  Result Date: 06/22/2023 CLINICAL DATA:  6.2285. Follow-up pneumothorax. Follow-up right greater than left chest wall emphysema EXAM: PORTABLE CHEST 1 VIEW COMPARISON:  Portable chest yesterday at 8:08 p.m. FINDINGS: 4:14 a.m. Stable tracheostomy cannula positioning with overlying oxygen mask. Chest wall emphysema is again noted in the left axilla, slightly improved, more extensively seen along the right chest wall, axilla and into the right supraclavicular fossa and is not significantly changed on the right. The superior pneumomediastinum is again noted and appears similar. The lungs are mildly emphysematous but clear. There is no substantial pleural effusion. Heart size and vascular pattern are normal. Aortic ectasia and tortuosity with stent graft in arch proximal descending segment are redemonstrated. Aortic atherosclerosis. No new osseous findings.  Osteopenia. IMPRESSION: 1.  Slightly improved left axillary chest wall emphysema. Unchanged more extensive right chest wall emphysema. 2. No significant change in the superior pneumomediastinum. No visible pneumothorax. 3. COPD. 4. Aortic atherosclerosis. 5. Stable tracheostomy cannula positioning. Electronically Signed   By: Almira Bar M.D.   On: 06/22/2023 06:03   DG Chest Port 1 View  Result Date: 06/21/2023 CLINICAL DATA:  Shortness of breath EXAM: PORTABLE CHEST 1 VIEW COMPARISON:  06/21/2023, 04/12/2023 11/26/2018 FINDINGS: Interval tracheostomy tube with tip about 4.5 cm superior to the carina. New pneumomediastinum and moderate amount of chest wall emphysema in the axilla and right supraclavicular fossa. Difficult to exclude trace apical pneumothoraces. Normal cardiac size. Thoracic aortic stent graft. IMPRESSION: 1. Interval tracheostomy tube. 2. New pneumomediastinum and moderate amount of chest wall  emphysema. Difficult to exclude trace apical pneumothoraces. These results will be called to the ordering clinician or representative by the Radiologist Assistant, and communication documented in the PACS or [employer]. Electronically Signed   By: Jasmine Pang M.D.   On: 06/21/2023 20:57   DG Chest Portable 1 View  Result Date: 06/21/2023 CLINICAL DATA:  hypoxia EXAM: PORTABLE CHEST 1 VIEW COMPARISON:  April 12, 2023 FINDINGS: Enlarged cardiomediastinal silhouette. Question increased conspicuity of the contours of the ascending thoracic aorta.Status post repair of the aortic arch. Tortuous thoracic aorta. No pleural effusion. No pneumothorax. No acute pleuroparenchymal abnormality. IMPRESSION: Question increased conspicuity of the contours of the ascending thoracic aorta. This could be due to rotation. Recommend dedicated PA and lateral chest radiograph for improved evaluation. If clinical concern for new aortic injury, then recommend dedicated CTA chest. Electronically Signed   By: Meda Klinefelter M.D.   On:  06/21/2023 17:12        Scheduled Meds:  amLODipine  5 mg Per Tube Daily   Chlorhexidine Gluconate Cloth  6 each Topical Daily   docusate  100 mg Per Tube BID   enoxaparin (LOVENOX) injection  40 mg Subcutaneous Q24H   feeding supplement (PROSource TF20)  60 mL Per Tube Daily   mouth rinse  15 mL Mouth Rinse 4 times per day   polyethylene glycol  17 g Per Tube Daily   thiamine  100 mg Per Tube Daily   Continuous Infusions:  ampicillin-sulbactam (UNASYN) IV Stopped (06/23/23 0454)   dextrose 5% lactated ringers 50 mL/hr at 06/23/23 0700   feeding supplement (OSMOLITE 1.5 CAL) 20 mL/hr at 06/23/23 0700          Glade Lloyd, MD [facility] 06/23/2023, 9:05 AM

## 2023-06-23 NOTE — Progress Notes (Signed)
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  Has been persistently hypertensive tonight, BP 167/101.  Home meds amlodipine 10 mg and carvedilol 3.125 mg.  eICU Interventions  Resume amlodipine at reduced dose.  Add hydralazine as needed.     Intervention Category Intermediate Interventions: Hypertension - evaluation and management  Valary Manahan 06/23/2023, 3:17 AM

## 2023-06-24 ENCOUNTER — Inpatient Hospital Stay (HOSPITAL_COMMUNITY): Payer: Medicare HMO

## 2023-06-24 ENCOUNTER — Encounter (HOSPITAL_COMMUNITY): Payer: Self-pay | Admitting: Otolaryngology

## 2023-06-24 DIAGNOSIS — E43 Unspecified severe protein-calorie malnutrition: Secondary | ICD-10-CM | POA: Diagnosis not present

## 2023-06-24 DIAGNOSIS — R061 Stridor: Secondary | ICD-10-CM | POA: Diagnosis not present

## 2023-06-24 DIAGNOSIS — R0902 Hypoxemia: Secondary | ICD-10-CM | POA: Diagnosis not present

## 2023-06-24 DIAGNOSIS — Z430 Encounter for attention to tracheostomy: Secondary | ICD-10-CM | POA: Diagnosis not present

## 2023-06-24 DIAGNOSIS — R609 Edema, unspecified: Secondary | ICD-10-CM

## 2023-06-24 LAB — CBC WITH DIFFERENTIAL/PLATELET
Abs Immature Granulocytes: 0.02 10*3/uL (ref 0.00–0.07)
Basophils Absolute: 0 10*3/uL (ref 0.0–0.1)
Basophils Relative: 0 %
Eosinophils Absolute: 0 10*3/uL (ref 0.0–0.5)
Eosinophils Relative: 0 %
HCT: 34.7 % — ABNORMAL LOW (ref 39.0–52.0)
Hemoglobin: 10.8 g/dL — ABNORMAL LOW (ref 13.0–17.0)
Immature Granulocytes: 0 %
Lymphocytes Relative: 9 %
Lymphs Abs: 0.7 10*3/uL (ref 0.7–4.0)
MCH: 30.2 pg (ref 26.0–34.0)
MCHC: 31.1 g/dL (ref 30.0–36.0)
MCV: 96.9 fL (ref 80.0–100.0)
Monocytes Absolute: 0.5 10*3/uL (ref 0.1–1.0)
Monocytes Relative: 6 %
Neutro Abs: 6.7 10*3/uL (ref 1.7–7.7)
Neutrophils Relative %: 85 %
Platelets: 239 10*3/uL (ref 150–400)
RBC: 3.58 MIL/uL — ABNORMAL LOW (ref 4.22–5.81)
RDW: 13.3 % (ref 11.5–15.5)
WBC: 7.9 10*3/uL (ref 4.0–10.5)
nRBC: 0 % (ref 0.0–0.2)

## 2023-06-24 LAB — BASIC METABOLIC PANEL
Anion gap: 9 (ref 5–15)
BUN: 23 mg/dL (ref 8–23)
CO2: 28 mmol/L (ref 22–32)
Calcium: 8.4 mg/dL — ABNORMAL LOW (ref 8.9–10.3)
Chloride: 103 mmol/L (ref 98–111)
Creatinine, Ser: 0.82 mg/dL (ref 0.61–1.24)
GFR, Estimated: >60 mL/min (ref >60)
Glucose, Bld: 140 mg/dL — ABNORMAL HIGH (ref 70–99)
Potassium: 3.6 mmol/L (ref 3.5–5.1)
Sodium: 140 mmol/L (ref 135–145)

## 2023-06-24 LAB — PHOSPHORUS: Phosphorus: 2.7 mg/dL (ref 2.5–4.6)

## 2023-06-24 LAB — GLUCOSE, CAPILLARY
Glucose-Capillary: 115 mg/dL — ABNORMAL HIGH (ref 70–99)
Glucose-Capillary: 132 mg/dL — ABNORMAL HIGH (ref 70–99)
Glucose-Capillary: 119 mg/dL — ABNORMAL HIGH (ref 70–99)
Glucose-Capillary: 130 mg/dL — ABNORMAL HIGH (ref 70–99)
Glucose-Capillary: 153 mg/dL — ABNORMAL HIGH (ref 70–99)
Glucose-Capillary: 113 mg/dL — ABNORMAL HIGH (ref 70–99)

## 2023-06-24 LAB — MAGNESIUM: Magnesium: 2.1 mg/dL (ref 1.7–2.4)

## 2023-06-24 NOTE — Care Management Important Message (Signed)
Important Message  Patient Details  Name: Garrett Fleming MRN: [id number] Date of Birth: 04/27/1956   Important Message Given:  Yes - Medicare IM     Dorena Bodo 06/24/2023, 3:48 PM

## 2023-06-24 NOTE — Plan of Care (Signed)
Pt has rested quietly throughout the night with no distress noted. Alert and oriented. Trach intact with O2 per trach collar. Suctioned approximately every 2 hours for thick yellow sputum. Has strong cough. Trach care done and pads changed. SR. Voids per urinal. PEG intact with TF per pump. Residual of 30cc. Pt turns self in bed. Had one liquid brown BM. Medicated once for pain with relief noted. No further complaints voiced.     Problem: Education: Goal: Knowledge about tracheostomy care/management will improve Outcome: Progressing   Problem: Activity: Goal: Ability to tolerate increased activity will improve Outcome: Progressing   Problem: Health Behavior/Discharge Planning: Goal: Ability to manage tracheostomy will improve Outcome: Progressing   Problem: Respiratory: Goal: Patent airway maintenance will improve Outcome: Progressing   Problem: Role Relationship: Goal: Ability to communicate will improve Outcome: Progressing   Problem: Education: Goal: Knowledge of General Education information will improve Description: Including pain rating scale, medication(s)/side effects and non-pharmacologic comfort measures Outcome: Progressing   Problem: Health Behavior/Discharge Planning: Goal: Ability to manage health-related needs will improve Outcome: Progressing   Problem: Clinical Measurements: Goal: Ability to maintain clinical measurements within normal limits will improve Outcome: Progressing Goal: Will remain free from infection Outcome: Progressing Goal: Diagnostic test results will improve Outcome: Progressing Goal: Respiratory complications will improve Outcome: Progressing Goal: Cardiovascular complication will be avoided Outcome: Progressing   Problem: Activity: Goal: Risk for activity intolerance will decrease Outcome: Progressing   Problem: Nutrition: Goal: Adequate nutrition will be maintained Outcome: Progressing   Problem: Coping: Goal: Level of  anxiety will decrease Outcome: Progressing   Problem: Elimination: Goal: Will not experience complications related to bowel motility Outcome: Progressing Goal: Will not experience complications related to urinary retention Outcome: Progressing   Problem: Pain Managment: Goal: General experience of comfort will improve Outcome: Progressing   Problem: Safety: Goal: Ability to remain free from injury will improve Outcome: Progressing   Problem: Skin Integrity: Goal: Risk for impaired skin integrity will decrease Outcome: Progressing

## 2023-06-24 NOTE — Progress Notes (Signed)
PROGRESS NOTE        PATIENT DETAILS Name: Garrett Fleming Age: 67 y.o. Sex: male Date of Birth: July 01, 1956 Admit Date: 06/21/2023 Admitting Physician Lorin Glass, MD ZOX:[id number], [name], MD  Brief Summary: Patient is a 67 y.o.  male with known history of laryngeal cancer (recent PEG tube insertion-followed at [facility])-presented with respiratory distress-required emergent tracheostomy by ENT.  See below for further details.  Significant events: 9/22>> Admit with emergent Trach by ENT 9/24>> transferred to [facility]  Significant studies:   Significant microbiology data: 9/22>> blood culture: No growth 9/22>> influenza/COVID PCR/RSV PCR: Negative  Procedures: 9/22>> tracheotomy by ENT  Consults: ENT PCCM  Subjective: Lying comfortably in bed-denies any chest pain or shortness of breath.  Objective: Vitals: Blood pressure (!) 144/85, pulse 68, temperature 98.9 F (37.2 C), temperature source Oral, resp. rate 20, weight 57.5 kg, SpO2 98%.   Exam: Gen Exam:not in any distress-[place] sick appearing. HEENT:atraumatic, normocephalic Chest: B/L clear to auscultation anteriorly CVS:S1S2 regular Abdomen:soft non tender, non distended Extremities:no edema Neurology: Non focal Skin: no rash  Pertinent Labs/Radiology:    Latest Ref Rng & Units 06/24/2023    4:38 AM 06/23/2023    5:09 AM 06/22/2023    3:58 AM  CBC  WBC 4.0 - 10.5 K/uL 7.9  10.3  13.7   Hemoglobin 13.0 - 17.0 g/dL 40.9  81.1  91.4   Hematocrit 39.0 - 52.0 % 34.7  35.1  32.2   Platelets 150 - 400 K/uL 239  257  264     Lab Results  Component Value Date   NA 140 06/24/2023   K 3.6 06/24/2023   CL 103 06/24/2023   CO2 28 06/24/2023      Assessment/Plan: Acute hypoxic respiratory failure secondary to obstructing laryngeal mass-known history of laryngeal cancer S/p emergent tracheotomy by ENT on 9/22 Currently stable-on 6 L via trach collar Trach change on  postoperative day 5 per ENT Plans are for continued follow-up with ENT/oncology at [facility].  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  AKI Hemodynamically mediated Resolved  Normocytic anemia Secondary to underlying malignancy Hb stable  HTN BP stable Amlodipine  Nutrition Status: Nutrition Problem: Severe Malnutrition Etiology: chronic illness (laryngeal mass) Signs/Symptoms: severe fat depletion, severe muscle depletion Interventions: Refer to RD note for recommendations, Tube feeding  Underweight: Estimated body mass index is 15.43 kg/m as calculated from the following:   Height as of 05/04/23: 6\' 4"  (1.93 m).   Weight as of this encounter: 57.5 kg.   Code status:   Code Status: Full Code   DVT Prophylaxis: enoxaparin (LOVENOX) injection 40 mg Start: 06/22/23 1700 SCDs Start: 06/21/23 1845   Family Communication: None at bedside   Disposition Plan: Status is: Inpatient Remains inpatient appropriate because: Severity of illness   Planned Discharge Destination:Home   Diet: Diet Order             Diet NPO time specified  Diet effective now                     Antimicrobial agents: Anti-infectives (From admission, onward)    Start     Dose/Rate Route Frequency Ordered Stop   06/21/23 2300  Ampicillin-Sulbactam (UNASYN) 3 g in  sodium chloride 0.9 % 100 mL IVPB        3 g 200 mL/hr over 30 Minutes Intravenous Every 6 hours 06/21/23 1857 06/26/23 2359   06/21/23 1700  vancomycin (VANCOREADY) IVPB 1500 mg/300 mL  Status:  Discontinued        1,500 mg 150 mL/hr over 120 Minutes Intravenous  Once 06/21/23 1648 06/22/23 1552   06/21/23 1700  piperacillin-tazobactam (ZOSYN) IVPB 3.375 g        3.375 g 100 mL/hr over 30 Minutes Intravenous  Once 06/21/23 1648 06/21/23 1726        MEDICATIONS: Scheduled Meds:  amLODipine   5 mg Per Tube Daily   Chlorhexidine Gluconate Cloth  6 each Topical Daily   docusate  100 mg Per Tube BID   enoxaparin (LOVENOX) injection  40 mg Subcutaneous Q24H   feeding supplement (PROSource TF20)  60 mL Per Tube Daily   mouth rinse  15 mL Mouth Rinse 4 times per day   polyethylene glycol  17 g Per Tube Daily   thiamine  100 mg Per Tube Daily   Continuous Infusions:  ampicillin-sulbactam (UNASYN) IV 3 g (06/24/23 1053)   feeding supplement (OSMOLITE 1.5 CAL) 1,000 mL (06/23/23 1720)   PRN Meds:.acetaminophen, docusate sodium, fentaNYL (SUBLIMAZE) injection, fentaNYL (SUBLIMAZE) injection, hydrALAZINE, ondansetron (ZOFRAN) IV, mouth rinse, oxyCODONE, polyethylene glycol   I have personally reviewed following labs and imaging studies  LABORATORY DATA: CBC: Recent Labs  Lab 06/21/23 1640 06/22/23 0358 06/23/23 0509 06/24/23 0438  WBC 12.2* 13.7* 10.3 7.9  NEUTROABS 10.3*  --   --  6.7  HGB 14.3 10.4* 11.4* 10.8*  HCT 43.7 32.2* 35.1* 34.7*  MCV 93.8 93.1 97.2 96.9  PLT 390 264 257 239    Basic Metabolic Panel: Recent Labs  Lab 06/21/23 1640 06/22/23 0358 06/22/23 2002 06/23/23 0509 06/23/23 1711 06/24/23 0438  NA 140 137  --  139  --  140  K 4.1 4.3  --  3.7  --  3.6  CL 98 99  --  102  --  103  CO2 25 29  --  26  --  28  GLUCOSE 140* 202*  --  111*  --  140*  BUN 45* 42*  --  36*  --  23  CREATININE 1.43* 1.36*  --  1.14  --  0.82  CALCIUM 9.4 8.4*  --  8.4*  --  8.4*  MG  --  2.5* 2.4 2.3 2.2 2.1  PHOS  --  4.8* 3.7 2.5 2.8 2.7    GFR: Estimated Creatinine Clearance: 73 mL/min (by C-G formula based on SCr of 0.82 mg/dL).  Liver Function Tests: Recent Labs  Lab 06/21/23 1640 06/23/23 0509  AST 23 20  ALT 32 26  ALKPHOS 97 71  BILITOT 1.5* 1.0  PROT 7.5 6.1*  ALBUMIN 3.7 2.9*   No results for input(s): "LIPASE", "AMYLASE" in the last 168 hours. No results for input(s): "AMMONIA" in the last 168 hours.  Coagulation Profile: No results for  input(s): "INR", "PROTIME" in the last 168 hours.  Cardiac Enzymes: No results for input(s): "CKTOTAL", "CKMB", "CKMBINDEX", "TROPONINI" in the last 168 hours.  BNP (last 3 results) No results for input(s): "PROBNP" in the last 8760 hours.  Lipid Profile: No results for input(s): "CHOL", "HDL", "LDLCALC", "TRIG", "CHOLHDL", "LDLDIRECT" in the last 72 hours.  Thyroid Function Tests: No results for input(s): "TSH", "T4TOTAL", "FREET4", "T3FREE", "THYROIDAB" in the last 72 hours.  Anemia Panel: No  results for input(s): "VITAMINB12", "FOLATE", "FERRITIN", "TIBC", "IRON", "RETICCTPCT" in the last 72 hours.  Urine analysis:    Component Value Date/Time   COLORURINE YELLOW 11/17/2018 0144   APPEARANCEUR CLEAR 11/17/2018 0144   LABSPEC >1.046 (H) 11/17/2018 0144   PHURINE 6.0 11/17/2018 0144   GLUCOSEU NEGATIVE 11/17/2018 0144   HGBUR NEGATIVE 11/17/2018 0144   BILIRUBINUR NEGATIVE 11/17/2018 0144   KETONESUR NEGATIVE 11/17/2018 0144   PROTEINUR NEGATIVE 11/17/2018 0144   NITRITE NEGATIVE 11/17/2018 0144   LEUKOCYTESUR NEGATIVE 11/17/2018 0144    Sepsis Labs: Lactic Acid, Venous    Component Value Date/Time   LATICACIDVEN 2.0 (HH) 06/21/2023 1655    MICROBIOLOGY: Recent Results (from the past 240 hour(s))  Resp panel by RT-PCR (RSV, Flu A&B, Covid) Anterior Nasal Swab     Status: None   Collection Time: 06/21/23  4:32 PM   Specimen: Anterior Nasal Swab  Result Value Ref Range Status   SARS Coronavirus 2 by RT PCR NEGATIVE NEGATIVE Final   Influenza A by PCR NEGATIVE NEGATIVE Final   Influenza B by PCR NEGATIVE NEGATIVE Final    Comment: (NOTE) The Xpert Xpress SARS-CoV-2/FLU/RSV plus assay is intended as an aid in the diagnosis of influenza from Nasopharyngeal swab specimens and should not be used as a sole basis for treatment. Nasal washings and aspirates are unacceptable for Xpert Xpress SARS-CoV-2/FLU/RSV testing.  Fact Sheet for  Patients: BloggerCourse.com  Fact Sheet for Healthcare Providers: SeriousBroker.it  This test is not yet approved or cleared by the [place] FDA and has been authorized for detection and/or diagnosis of SARS-CoV-2 by FDA under an Emergency Use Authorization (EUA). This EUA will remain in effect (meaning this test can be used) for the duration of the COVID-19 declaration under Section 564(b)(1) of the Act, 21 U.S.C. section 360bbb-3(b)(1), unless the authorization is terminated or revoked.     Resp Syncytial Virus by PCR NEGATIVE NEGATIVE Final    Comment: (NOTE) Fact Sheet for Patients: BloggerCourse.com  Fact Sheet for Healthcare Providers: SeriousBroker.it  This test is not yet approved or cleared by the [place] FDA and has been authorized for detection and/or diagnosis of SARS-CoV-2 by FDA under an Emergency Use Authorization (EUA). This EUA will remain in effect (meaning this test can be used) for the duration of the COVID-19 declaration under Section 564(b)(1) of the Act, 21 U.S.C. section 360bbb-3(b)(1), unless the authorization is terminated or revoked.  Performed at [facility], [address] [place], [place] [address]   Blood culture (routine x 2)     Status: None (Preliminary result)   Collection Time: 06/21/23  4:40 PM   Specimen: BLOOD  Result Value Ref Range Status   Specimen Description BLOOD SITE NOT SPECIFIED  Final   Special Requests   Final    BOTTLES DRAWN AEROBIC AND ANAEROBIC Blood Culture results may not be optimal due to an excessive volume of blood received in culture bottles   Culture   Final    NO GROWTH 3 DAYS Performed at [facility], [address], [place], [place] [address]    Report Status PENDING  Incomplete  Blood culture (routine x 2)     Status: None (Preliminary result)   Collection Time: 06/21/23  4:55 PM    Specimen: BLOOD  Result Value Ref Range Status   Specimen Description BLOOD SITE NOT SPECIFIED  Final   Special Requests   Final    BOTTLES DRAWN AEROBIC AND ANAEROBIC Blood Culture adequate  volume   Culture   Final    NO GROWTH 3 DAYS Performed at [facility], [address], [place], [place] [address]    Report Status PENDING  Incomplete  MRSA Next Gen by PCR, Nasal     Status: None   Collection Time: 06/21/23  8:20 PM   Specimen: Nasal Mucosa; Nasal Swab  Result Value Ref Range Status   MRSA by PCR Next Gen NOT DETECTED NOT DETECTED Final    Comment: (NOTE) The GeneXpert MRSA Assay (FDA approved for NASAL specimens only), is one component of a comprehensive MRSA colonization surveillance program. It is not intended to diagnose MRSA infection nor to guide or monitor treatment for MRSA infections. Test performance is not FDA approved in patients less than 69 years old. Performed at [facility], [address] [place], [place] [address]     RADIOLOGY STUDIES/RESULTS: No results found.   LOS: 3 days   Jeoffrey Massed, MD  [facility]    To contact the attending provider between 7A-7P or the covering provider during after hours 7P-7A, please log into the web site [email or web address] and access using universal [place] password for that web site. If you do not have the password, please call the hospital operator.  06/24/2023, 11:57 AM

## 2023-06-24 NOTE — Progress Notes (Addendum)
NAME:  Garrett Fleming, MRN:  [id number], DOB:  04-13-1956, LOS: 3 ADMISSION DATE:  06/21/2023, CONSULTATION DATE:  06/21/23 REFERRING MD:  Dr Julieanne Manson EDP, CHIEF COMPLAINT:  laryngeal mass, Acute Respiratory Failure, , stridor  PCP Quintella Reichert, MD [facility] - DR   History of Present Illness: - EDP, ERN and chart review    67 year old male with extreme failure to thrive and cachexia having lost 100 pounds in the last 1 year.  [BMI of 18.9 as of August 2024].  He presented to the [facility] ED on 05/04/2023 with a laryngeal mass with external visibility and pressing into the airway.  He then went to [facility] in [place] and admitted there.  He received antibiotics.  Status post ultrasound-guided biopsy on 05/05/2023 insufficient for diagnosis.  Then on 05/08/2023 at the regular endoscopy biopsy was done [pathology report pending].  ment   Extensive necrosis and acute inflammation are present.  Cells with features diagnostic of malignancy are not seen in this biopsy.  If clinically a neoplastic process is still highly suspected, rebiopsy of the lesion to obtain more viable tissue is recommended.  Clinical correlation is recommended.    Immediate Evaluation   A. Neck. Adequate.   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      Was deemed that he would need a total laryngectomy.  The mass was felt to be ulcerated internally.  He then had a PEG tube placed on 05/07/2023 he was treated with antibiotics and Decadron and discharged.     It is unclear if his had any follow-up with ENT oncology as directed by [facility].  But he presented to the ED at [facility] on 06/21/2023 from home with worsening shortness of breath.  Could not tolerate CPAP on the way.  Describing continued weight loss and failure to thrive.  Hypoxemic requiring nonrebreather.  Expressed inability to lie flat.  Any attempt to  lie flat produced instant stridor.  The laryngeal mass is ulcerated now and exuding pus.  He did get Solu-Medrol.  Pulmonary medicine/critical care medicine has been asked to consult.  He denied any aspiration of fever.  Chest x-ray is clear.  Unable to lie flat for CT scan      Past Medical History:    has a past medical history of Ankle fracture, left, Arthritis, Ascending aortic aneurysm (HCC) (01/21/2019), Blind right eye, Cocaine abuse (HCC) (11/17/2018), Coronary artery calcification seen on CAT scan (01/21/2019), Discomfort in chest, Essential hypertension (11/17/2018), Fracture of lateral malleolus of left ankle (12/02/2013), Keloid (11/16/2018), Penetrating atherosclerotic ulcer of aorta (HCC) (11/16/2018), and Smoking (11/17/2018).  has a past surgical history that includes right eye surgery  (1977); ORIF ankle fracture (Left, 12/02/2013); Carotid-subclavian Bypass Graft (Left, 11/26/2018); Thoracic aortic endovascular stent graft (N/A, 11/26/2018); Ultrasound guidance for vascular access (Bilateral, 11/26/2018); Lesion removal (Left, 04/25/2020); Kenolog injection (Right, 04/25/2020); and Tracheostomy tube placement (N/A, 06/21/2023).    Significant Hospital Events:  06/21/2023 - admit. Emergent trach   Interim History / Subjective:   06/24/23 - awaiting dispo. On 6L ATC.   Objective   Blood pressure (!) 144/85, pulse 68, temperature 98.9 F (37.2 C), temperature source Oral, resp. rate 20, weight 57.5 kg, SpO2 98%.    FiO2 (%):  [28 %] 28 %   Intake/Output Summary (Last 24 hours) at 06/24/2023 0859 Last data filed at 06/24/2023 0550 Gross per 24 hour  Intake 1559.87  ml  Output 660 ml  Net 899.87 ml   Filed Weights   06/21/23 1838 06/22/23 0409 06/23/23 0430  Weight: 70 kg 56.9 kg 57.5 kg    Examination: General Appearance:  Looks chronically unwell. Cachectic Head:  Normocephalic, without obvious abnormality, atraumatic Eyes:  Rt eye blind Ears:  Normal external ear canals, both  ears Nose:  G tube - no Throat:  ETT TUBE - no , OG tube - no. TRACH + ATC + Neck:  Supple,  No enlargement/tenderness/nodules Lungs: Clear to auscultation bilaterally, No distress Heart:  S1 and S2 normal, no murmur, CVP - no.  Pressors - no Abdomen:  Soft, no masses, no organomegaly Genitalia / Rectal:  Not done Extremities:  Extremities- intact but cachectic Skin:  ntact in exposed areas . Sacral area - not examind Neurologic:  Sedation - none -> RASS --1 . Moves all 4s - yes. CAM-ICU - not tested . Orientation - not tested       Resolved Hospital Problem list   X  Assessment & Plan:     Upper airway mass laryngeal mass that is ulcerated status post biopsy 05/05/2023 at [facility] c/w SCC cancer  Progression in the mass [in need of post total laryngectomy]  -causing stridor -Suspect very narrow airway; very high risk Acute hypoxemic respiratory failure secondary to the above [suspected aspiration although he denies]  S/p emergent trach 06/21/23 and now on ATC 6L   P:   Get CT chest Get CT neck Doppler LE     Possible aspiration given upper airway obstruction and PEG tube but patient denies Also secondary infection of laryngel mass now with ulcaration    P:   Per triad Anti-infectives (From admission, onward)    Start     Dose/Rate Route Frequency Ordered Stop   06/21/23 2300  Ampicillin-Sulbactam (UNASYN) 3 g in sodium chloride 0.9 % 100 mL IVPB        3 g 200 mL/hr over 30 Minutes Intravenous Every 6 hours 06/21/23 1857 06/26/23 2359   06/21/23 1700  vancomycin (VANCOREADY) IVPB 1500 mg/300 mL  Status:  Discontinued        1,500 mg 150 mL/hr over 120 Minutes Intravenous  Once 06/21/23 1648 06/22/23 1552   06/21/23 1700  piperacillin-tazobactam (ZOSYN) IVPB 3.375 g        3.375 g 100 mL/hr over 30 Minutes Intravenous  Once 06/21/23 1648 06/21/23 1726           Status post PEG tube in May 05, 2023 due to dysphagia Croop airway  compromise    P:   Per triad   MSK/DERM 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 Kalman Shan, M.D., F.C.C.P,  Pulmonary and Critical Care Medicine Staff Physician, [facility] Center Director - Interstitial Lung Disease  Program  Pulmonary Fibrosis [facility] at [facility] [place], [place], [address]   Pager: [phone], If no answer  -> Check AMION or Try [phone] Telephone (clinical office): [phone] Telephone (research): [phone]  2:15 PM 06/24/2023

## 2023-06-24 NOTE — TOC CM/SW Note (Signed)
Transition of Care [facility]) - Inpatient Brief Assessment   Patient Details  Name: Garrett Fleming MRN: [id number] Date of Birth: 03-06-1956  Transition of Care [facility]) CM/SW Contact:    Mearl Latin, LCSW Phone Number: 06/24/2023, 2:26 PM   Clinical Narrative: Patient transferred to 5W. He was admitted from home and underwent tracheostomy placement. TOC following for home needs.    Transition of Care Asessment: Insurance and Status: Insurance coverage has been reviewed Patient has primary care physician: Yes Home environment has been reviewed: From home Prior level of function:: Independent   Social Determinants of Health Reivew: SDOH reviewed no interventions necessary Readmission risk has been reviewed: Yes Transition of care needs: transition of care needs identified, TOC will continue to follow

## 2023-06-24 NOTE — TOC Progression Note (Signed)
Transition of Care [facility]) - Progression Note    Patient Details  Name: Garrett Fleming MRN: [id number] Date of Birth: 22-Oct-1955  Transition of Care [facility]) CM/SW Contact  Gordy Clement, RN Phone Number: 06/24/2023, 2:36 PM  Clinical Narrative:     CM received requested call back from Patient's Wife.  Need a good phone number to reach patient/Wife. The only phone they have access to right now is a Brother's phone.  Number is : [phone].  Wife states that the battery dies often so if there is no answer, it means phone is on charger and to please call back. Team notified via secure chat for purposes of communication with other  institution for laryngectomy   TOC will continue to follow patient for any additional discharge needs          Expected Discharge Plan and Services                                               Social Determinants of Health (SDOH) Interventions SDOH Screenings   Food Insecurity: Low Risk  (05/06/2023)   Received from [facility]  Utilities: Medium Risk (05/06/2023)   Received from [facility]  Tobacco Use: High Risk (05/04/2023)    Readmission Risk Interventions     No data to display

## 2023-06-24 NOTE — Progress Notes (Signed)
BLE venous duplex has been completed.    Results can be found under chart review under CV PROC. 06/24/2023 4:47 PM Darcy Barbara RVT, RDMS

## 2023-06-25 ENCOUNTER — Inpatient Hospital Stay (HOSPITAL_COMMUNITY): Payer: Medicare HMO

## 2023-06-25 DIAGNOSIS — R061 Stridor: Secondary | ICD-10-CM | POA: Diagnosis not present

## 2023-06-25 DIAGNOSIS — R0902 Hypoxemia: Secondary | ICD-10-CM | POA: Diagnosis not present

## 2023-06-25 DIAGNOSIS — Z430 Encounter for attention to tracheostomy: Secondary | ICD-10-CM | POA: Diagnosis not present

## 2023-06-25 DIAGNOSIS — E43 Unspecified severe protein-calorie malnutrition: Secondary | ICD-10-CM | POA: Diagnosis not present

## 2023-06-25 LAB — CULTURE, BLOOD (ROUTINE X 2)
Culture: NO GROWTH
Culture: NO GROWTH
Special Requests: ADEQUATE

## 2023-06-25 LAB — GLUCOSE, CAPILLARY
Glucose-Capillary: 105 mg/dL — ABNORMAL HIGH (ref 70–99)
Glucose-Capillary: 154 mg/dL — ABNORMAL HIGH (ref 70–99)
Glucose-Capillary: 107 mg/dL — ABNORMAL HIGH (ref 70–99)
Glucose-Capillary: 131 mg/dL — ABNORMAL HIGH (ref 70–99)

## 2023-06-25 NOTE — Progress Notes (Signed)
RN went with patient to CT with assist from transporter. Patient placed on 6L trach collar for transport and remained on monitor. Patient did well during scan. Placed back on wall oxygen at 6L, patient repositioned, and call light within reach.  Primary RN aware of return.

## 2023-06-25 NOTE — Progress Notes (Signed)
Physical Therapy Treatment Patient Details Name: Garrett Fleming MRN: [id number] DOB: 1956-01-01 Today's Date: 06/25/2023   History of Present Illness 67 y.o. male admitted 9/22 with hypoxemic respiratory failure due to airway stenosis with infection s/p emergent awake tracheostomy. PMxh: larynx CA, left ankle fx, HTN, smoker, cocaine abuse, aortic aneurysm, blind Rt eye, PEG    PT Comments  Patient agreeable to ambulation. Sats >95% on 28% trach collar throughout. No standing rest breaks needed x200 ft. Pt was unsteady and required min assist to maintain balance. Agreed he would use a device next time he walks. May benefit from rollator (unsure how far he has to walk from RV to bathouse/bathroom).     If plan is discharge home, recommend the following: Assistance with cooking/housework;A little help with bathing/dressing/bathroom;A little help with walking and/or transfers   Can travel by private vehicle        Equipment Recommendations  Rollator (4 wheels) (for walking to/from RV and bathouse)    Recommendations for Other Services       Precautions / Restrictions Precautions Precautions: Fall;Other (comment) Precaution Comments: PEG, trach Restrictions Weight Bearing Restrictions: No     Mobility  Bed Mobility Overal bed mobility: Independent Bed Mobility: Supine to Sit     Supine to sit: Independent          Transfers Overall transfer level: Needs assistance Equipment used: None Transfers: Sit to/from Stand Sit to Stand: Contact guard assist           General transfer comment: CGA for lines and safety due to mild dizziness that quickly dissipated;    Ambulation/Gait Ambulation/Gait assistance: Min assist Gait Distance (Feet): 200 Feet Assistive device: None Gait Pattern/deviations: Step-through pattern, Decreased stride length, Staggering left, Staggering right, Drifts right/left   Gait velocity interpretation: 1.31 - 2.62 ft/sec, indicative of limited  community ambulator   General Gait Details: on 28% trach collar with sats >95% throughout; pt drifting and staggering once out of his room; educated to widen his BOS and incr his velocity with some improvement noted, required cues to maintain compensations; once seated discussed use of RW when he next walks and he agreed that would be good   Comptroller Bed    Modified Rankin (Stroke Patients Only)       Balance Overall balance assessment: Mild deficits observed, not formally tested                                          Cognition Arousal: Alert Behavior During Therapy: WFL for tasks assessed/performed Overall Cognitive Status: Within Functional Limits for tasks assessed                                          Exercises      General Comments        Pertinent Vitals/Pain Pain Assessment Pain Assessment: Faces Faces Pain Scale: No hurt    Home Living                          Prior Function            PT Goals (current goals can now be  found in the care plan section) Acute Rehab PT Goals Patient Stated Goal: return home PT Goal Formulation: With patient/family Time For Goal Achievement: 07/06/23 Potential to Achieve Goals: Good Progress towards PT goals: Not progressing toward goals - comment (required incr assist with decr balance compared to evaluation)    Frequency    Min 1X/week      PT Plan      Co-evaluation              AM-PAC PT "6 Clicks" Mobility   Outcome Measure  Help needed turning from your back to your side while in a flat bed without using bedrails?: None Help needed moving from lying on your back to sitting on the side of a flat bed without using bedrails?: None Help needed moving to and from a bed to a chair (including a wheelchair)?: A Little Help needed standing up from a chair using your arms (e.g., wheelchair or bedside chair)?: A  Little Help needed to walk in hospital room?: A Little Help needed climbing 3-5 steps with a railing? : A Little 6 Click Score: 20    End of Session Equipment Utilized During Treatment: Oxygen Activity Tolerance: Patient tolerated treatment well Patient left: in chair;with call bell/phone within reach;with family/visitor present Nurse Communication: Mobility status PT Visit Diagnosis: Other abnormalities of gait and mobility (R26.89)     Time: 5621-3086 PT Time Calculation (min) (ACUTE ONLY): 27 min  Charges:    $Gait Training: 23-37 mins PT General Charges $$ ACUTE PT VISIT: 1 Visit                      Jerolyn Center, PT Acute Rehabilitation Services  Office [phone]    Zena Amos 06/25/2023, 11:19 AM

## 2023-06-25 NOTE — Plan of Care (Signed)
Pt has rested  quietly throughout the night with no distress noted. Alert and oriented. Trach intact with O2 per trach collar. Has large amount thick yellow secretions, suctioned approximately every 2 hours with cleansing around chest and new pads applied each time. PEG intact with TF at 60 on pump. Voids per urinal. Medicated for pain once with relief noted. Visitor at bedside. No complaints voiced.     Problem: Education: Goal: Knowledge about tracheostomy care/management will improve Outcome: Progressing   Problem: Activity: Goal: Ability to tolerate increased activity will improve Outcome: Progressing   Problem: Health Behavior/Discharge Planning: Goal: Ability to manage tracheostomy will improve Outcome: Progressing   Problem: Respiratory: Goal: Patent airway maintenance will improve Outcome: Progressing   Problem: Role Relationship: Goal: Ability to communicate will improve Outcome: Progressing   Problem: Education: Goal: Knowledge of General Education information will improve Description: Including pain rating scale, medication(s)/side effects and non-pharmacologic comfort measures Outcome: Progressing   Problem: Health Behavior/Discharge Planning: Goal: Ability to manage health-related needs will improve Outcome: Progressing   Problem: Clinical Measurements: Goal: Ability to maintain clinical measurements within normal limits will improve Outcome: Progressing Goal: Will remain free from infection Outcome: Progressing Goal: Diagnostic test results will improve Outcome: Progressing Goal: Respiratory complications will improve Outcome: Progressing Goal: Cardiovascular complication will be avoided Outcome: Progressing   Problem: Activity: Goal: Risk for activity intolerance will decrease Outcome: Progressing   Problem: Nutrition: Goal: Adequate nutrition will be maintained Outcome: Progressing   Problem: Coping: Goal: Level of anxiety will decrease Outcome:  Progressing   Problem: Elimination: Goal: Will not experience complications related to bowel motility Outcome: Progressing Goal: Will not experience complications related to urinary retention Outcome: Progressing   Problem: Pain Managment: Goal: General experience of comfort will improve Outcome: Progressing   Problem: Safety: Goal: Ability to remain free from injury will improve Outcome: Progressing   Problem: Skin Integrity: Goal: Risk for impaired skin integrity will decrease Outcome: Progressing

## 2023-06-25 NOTE — Progress Notes (Signed)
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    GOAL:   Patient will meet greater than or equal to 90% of their needs    MONITOR:   Labs, Weight trends, TF tolerance, I & O's  REASON FOR ASSESSMENT:   Consult Enteral/tube feeding initiation and management  ASSESSMENT:   67 y.o. male with extreme failure to thrive who initially presented 05/04/2023 with a laryngeal mass pressing into the airway. He then went to [facility] in [place] and admitted there. He had a PEG tube placed on 05/07/2023 he was treated with antibiotics and Decadron and discharged. Presented back to [facility] 06/21/2023 with worsening shortness of breath. 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  No concerns noted.  Continues to tolerate feedings. Suspect current nutr poc providing adequate nutrition.   Meds; NORVASC, LOVENOX, B1,   9/25 Labs; BG140, Ca; 8.4  NUTRITION - FOCUSED PHYSICAL EXAM:  Flowsheet Row Most Recent Value  Orbital Region Severe depletion  Upper Arm Region Severe depletion  Thoracic and Lumbar Region Severe depletion  Buccal Region Severe depletion  Temple Region Severe depletion  Clavicle Bone Region Severe depletion  Clavicle and Acromion Bone Region Severe depletion  Scapular Bone Region Unable to assess  Dorsal Hand Severe depletion  Patellar Region Severe depletion  Anterior Thigh Region Severe depletion  Posterior Calf Region Severe depletion  Edema (RD Assessment) None  Hair Reviewed  Eyes Reviewed  Mouth Reviewed  Skin Reviewed  Nails Reviewed       Diet Order:   Diet  Order             Diet NPO time specified  Diet effective now                   EDUCATION NEEDS:   Education needs have been addressed  Skin:  Skin Assessment: Reviewed RN Assessment  Last BM:  9/25  Height:   Ht Readings from Last 1 Encounters:  05/04/23 6\' 4"  (1.93 m)    Weight:   Wt Readings from Last 1 Encounters:  06/25/23 58.8 kg    Ideal Body Weight:     BMI:  Body mass index is 15.78 kg/m.  Estimated Nutritional Needs:   Kcal:  1920-2300 kcal  Protein:  77-89g  Fluid:  >/=2L    Ricardo Jericho RDN, LDN

## 2023-06-25 NOTE — Progress Notes (Signed)
SLP Cancellation Note  Patient Details Name: Garrett Fleming MRN: [id number] DOB: 01-Feb-1956   Cancelled treatment:       Reason Eval/Treat Not Completed: Other (comment). Given ongoing severe secretions, PMSV intolerance and risk of damage to tissue and severe dysphagia will defer further acute interventions. Recommend SLP f/u adjacent to laryngectomy when appropriate.    Gaylene Moylan, Riley Nearing 06/25/2023, 12:43 PM

## 2023-06-25 NOTE — Progress Notes (Signed)
Ent note   Plan for trach change Friday 06/26/23. Please instruct discharge planner to order shiley 6-0 scuffles tracheostomy supplies.    Please obtain supplies for tracheostomy tube change Friday morning 06/26/23 1. Shiley size 6-0 cuff less tracheostomy tube  2. Tracheostomy soft collar 3. Suture removal kit 4. Lubricant  5. Working suction canister with both yankauer and 12 or 14 French soft suction catheters.  Scarlette Ar MD

## 2023-06-25 NOTE — Progress Notes (Signed)
PROGRESS NOTE        PATIENT DETAILS Name: Garrett Fleming Age: 67 y.o. Sex: male Date of Birth: 04/23/1956 Admit Date: 06/21/2023 Admitting Physician Garrett Glass, MD NFA:OZHYQM, Garrett Bryant, MD  Brief Summary: Patient is a 67 y.o.  male with known history of laryngeal cancer (recent PEG tube insertion-followed at [facility])-presented with respiratory distress-required emergent tracheostomy by ENT.  See below for further details.  Significant events: 9/22>> Admit with emergent Trach by ENT 9/24>> transferred to [facility]  Significant studies:   Significant microbiology data: 9/22>> blood culture: No growth 9/22>> influenza/COVID PCR/RSV PCR: Negative  Procedures: 9/22>> tracheotomy by ENT  Consults: ENT PCCM  Subjective: No major issues overnight.  Objective: Vitals: Blood pressure 122/78, pulse 64, temperature 99.2 F (37.3 C), temperature source Oral, resp. rate (!) 24, weight 58.8 kg, SpO2 96%.   Exam: Gen Exam:Alert awake-not in any distress HEENT:atraumatic, normocephalic Chest: B/L clear to auscultation anteriorly CVS:S1S2 regular Abdomen:soft non tender, non distended Extremities:no edema Neurology: Non focal Skin: no rash  Pertinent Labs/Radiology:    Latest Ref Rng & Units 06/24/2023    4:38 AM 06/23/2023    5:09 AM 06/22/2023    3:58 AM  CBC  WBC 4.0 - 10.5 K/uL 7.9  10.3  13.7   Hemoglobin 13.0 - 17.0 g/dL 57.8  46.9  62.9   Hematocrit 39.0 - 52.0 % 34.7  35.1  32.2   Platelets 150 - 400 K/uL 239  257  264     Lab Results  Component Value Date   NA 140 06/24/2023   K 3.6 06/24/2023   CL 103 06/24/2023   CO2 28 06/24/2023      Assessment/Plan: Acute hypoxic respiratory failure secondary to obstructing laryngeal mass-known history of laryngeal cancer S/p emergent tracheotomy by ENT on 9/22 Currently stable-on 6 L via trach collar Trach change on postoperative day 5 per ENT Plans are for continued follow-up  with ENT/oncology at [facility].  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  AKI Hemodynamically mediated Resolved  Normocytic anemia Secondary to underlying malignancy Hb stable  HTN BP stable Amlodipine  Nutrition Status: Nutrition Problem: Severe Malnutrition Etiology: chronic illness (laryngeal mass) Signs/Symptoms: severe fat depletion, severe muscle depletion Interventions: Refer to RD note for recommendations, Tube feeding  Underweight: Estimated body mass index is 15.78 kg/m as calculated from the following:   Height as of 05/04/23: 6\' 4"  (1.93 m).   Weight as of this encounter: 58.8 kg.   Code status:   Code Status: Full Code   DVT Prophylaxis: enoxaparin (LOVENOX) injection 40 mg Start: 06/22/23 1700 SCDs Start: 06/21/23 1845   Family Communication: Spouse at bedside   Disposition Plan: Status is: Inpatient Remains inpatient appropriate because: Severity of illness   Planned Discharge Destination:Home   Diet: Diet Order             Diet NPO time specified  Diet effective now                     Antimicrobial agents: Anti-infectives (From admission, onward)    Start     Dose/Rate Route Frequency Ordered Stop   06/21/23 2300  Ampicillin-Sulbactam (UNASYN) 3 g in sodium chloride 0.9 % 100 mL IVPB  DAYS Performed at [facility], [address], [place], [place] [address]    Report Status PENDING  Incomplete  MRSA Next Gen by PCR, Nasal     Status: None   Collection Time: 06/21/23  8:20 PM   Specimen: Nasal Mucosa; Nasal Swab  Result Value Ref Range Status   MRSA by PCR Next Gen NOT DETECTED NOT DETECTED Final    Comment: (NOTE) The GeneXpert MRSA Assay (FDA approved for NASAL specimens only), is one component of a comprehensive MRSA colonization surveillance program. It is not intended to diagnose MRSA infection nor to guide or monitor treatment for MRSA infections. Test performance is not FDA approved in patients less than 9 years old. Performed at [facility], [address] [place], [place] [address]     RADIOLOGY STUDIES/RESULTS: DG CHEST PORT 1 VIEW  Result Date: 06/25/2023 CLINICAL DATA:  Tracheostomy care. EXAM: PORTABLE CHEST 1 VIEW COMPARISON:  06/22/2023 FINDINGS: Tracheostomy tube unchanged in positioning with tip over the trachea at the level of the clavicular heads. Aortic stent graft unchanged in position. Stable heart size and mediastinal contours. Subcutaneous emphysema in the right greater than left chest wall, improving. No visible pneumothorax. No significant pleural effusion. No focal airspace disease. IMPRESSION: 1. Tracheostomy tube unchanged in positioning with tip over the trachea at the level of the clavicular heads. 2. Improving subcutaneous emphysema. Electronically Signed   By: Narda Rutherford M.D.   On: 06/25/2023 10:22   VAS [place] LOWER EXTREMITY VENOUS (DVT)  Result Date: 06/24/2023  Lower Venous DVT Study Patient Name:  Garrett Fleming  Date of Exam:   06/24/2023 Medical Rec #: [id number]         Accession #:    [id number] Date of Birth: 08-07-1956         Patient Gender: M  Patient Age:   9 years Exam Location:  [facility] Procedure:      VAS [place] LOWER EXTREMITY VENOUS (DVT) Referring Phys: Kalman Shan --------------------------------------------------------------------------------  Indications: "edema" per MD order.  Comparison Study: No previous exams Performing Technologist: Jody Hill RVT, RDMS  Examination Guidelines: A complete evaluation includes B-mode imaging, spectral Doppler, color Doppler, and power Doppler as needed of all accessible portions of each vessel. Bilateral testing is considered an integral part of a complete examination. Limited examinations for reoccurring indications may be performed as noted. The reflux portion of the exam is performed with the patient in reverse Trendelenburg.  +---------+---------------+---------+-----------+----------+--------------+ RIGHT    CompressibilityPhasicitySpontaneityPropertiesThrombus Aging +---------+---------------+---------+-----------+----------+--------------+ CFV      Full           Yes      Yes                                 +---------+---------------+---------+-----------+----------+--------------+ SFJ      Full                                                        +---------+---------------+---------+-----------+----------+--------------+ FV Prox  Full           Yes      Yes                                 +---------+---------------+---------+-----------+----------+--------------+  PROGRESS NOTE        PATIENT DETAILS Name: Garrett Fleming Age: 67 y.o. Sex: male Date of Birth: 04/23/1956 Admit Date: 06/21/2023 Admitting Physician Garrett Glass, MD NFA:OZHYQM, Garrett Bryant, MD  Brief Summary: Patient is a 67 y.o.  male with known history of laryngeal cancer (recent PEG tube insertion-followed at [facility])-presented with respiratory distress-required emergent tracheostomy by ENT.  See below for further details.  Significant events: 9/22>> Admit with emergent Trach by ENT 9/24>> transferred to [facility]  Significant studies:   Significant microbiology data: 9/22>> blood culture: No growth 9/22>> influenza/COVID PCR/RSV PCR: Negative  Procedures: 9/22>> tracheotomy by ENT  Consults: ENT PCCM  Subjective: No major issues overnight.  Objective: Vitals: Blood pressure 122/78, pulse 64, temperature 99.2 F (37.3 C), temperature source Oral, resp. rate (!) 24, weight 58.8 kg, SpO2 96%.   Exam: Gen Exam:Alert awake-not in any distress HEENT:atraumatic, normocephalic Chest: B/L clear to auscultation anteriorly CVS:S1S2 regular Abdomen:soft non tender, non distended Extremities:no edema Neurology: Non focal Skin: no rash  Pertinent Labs/Radiology:    Latest Ref Rng & Units 06/24/2023    4:38 AM 06/23/2023    5:09 AM 06/22/2023    3:58 AM  CBC  WBC 4.0 - 10.5 K/uL 7.9  10.3  13.7   Hemoglobin 13.0 - 17.0 g/dL 57.8  46.9  62.9   Hematocrit 39.0 - 52.0 % 34.7  35.1  32.2   Platelets 150 - 400 K/uL 239  257  264     Lab Results  Component Value Date   NA 140 06/24/2023   K 3.6 06/24/2023   CL 103 06/24/2023   CO2 28 06/24/2023      Assessment/Plan: Acute hypoxic respiratory failure secondary to obstructing laryngeal mass-known history of laryngeal cancer S/p emergent tracheotomy by ENT on 9/22 Currently stable-on 6 L via trach collar Trach change on postoperative day 5 per ENT Plans are for continued follow-up  with ENT/oncology at [facility].  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  AKI Hemodynamically mediated Resolved  Normocytic anemia Secondary to underlying malignancy Hb stable  HTN BP stable Amlodipine  Nutrition Status: Nutrition Problem: Severe Malnutrition Etiology: chronic illness (laryngeal mass) Signs/Symptoms: severe fat depletion, severe muscle depletion Interventions: Refer to RD note for recommendations, Tube feeding  Underweight: Estimated body mass index is 15.78 kg/m as calculated from the following:   Height as of 05/04/23: 6\' 4"  (1.93 m).   Weight as of this encounter: 58.8 kg.   Code status:   Code Status: Full Code   DVT Prophylaxis: enoxaparin (LOVENOX) injection 40 mg Start: 06/22/23 1700 SCDs Start: 06/21/23 1845   Family Communication: Spouse at bedside   Disposition Plan: Status is: Inpatient Remains inpatient appropriate because: Severity of illness   Planned Discharge Destination:Home   Diet: Diet Order             Diet NPO time specified  Diet effective now                     Antimicrobial agents: Anti-infectives (From admission, onward)    Start     Dose/Rate Route Frequency Ordered Stop   06/21/23 2300  Ampicillin-Sulbactam (UNASYN) 3 g in sodium chloride 0.9 % 100 mL IVPB  DAYS Performed at [facility], [address], [place], [place] [address]    Report Status PENDING  Incomplete  MRSA Next Gen by PCR, Nasal     Status: None   Collection Time: 06/21/23  8:20 PM   Specimen: Nasal Mucosa; Nasal Swab  Result Value Ref Range Status   MRSA by PCR Next Gen NOT DETECTED NOT DETECTED Final    Comment: (NOTE) The GeneXpert MRSA Assay (FDA approved for NASAL specimens only), is one component of a comprehensive MRSA colonization surveillance program. It is not intended to diagnose MRSA infection nor to guide or monitor treatment for MRSA infections. Test performance is not FDA approved in patients less than 9 years old. Performed at [facility], [address] [place], [place] [address]     RADIOLOGY STUDIES/RESULTS: DG CHEST PORT 1 VIEW  Result Date: 06/25/2023 CLINICAL DATA:  Tracheostomy care. EXAM: PORTABLE CHEST 1 VIEW COMPARISON:  06/22/2023 FINDINGS: Tracheostomy tube unchanged in positioning with tip over the trachea at the level of the clavicular heads. Aortic stent graft unchanged in position. Stable heart size and mediastinal contours. Subcutaneous emphysema in the right greater than left chest wall, improving. No visible pneumothorax. No significant pleural effusion. No focal airspace disease. IMPRESSION: 1. Tracheostomy tube unchanged in positioning with tip over the trachea at the level of the clavicular heads. 2. Improving subcutaneous emphysema. Electronically Signed   By: Narda Rutherford M.D.   On: 06/25/2023 10:22   VAS [place] LOWER EXTREMITY VENOUS (DVT)  Result Date: 06/24/2023  Lower Venous DVT Study Patient Name:  Garrett Fleming  Date of Exam:   06/24/2023 Medical Rec #: [id number]         Accession #:    [id number] Date of Birth: 08-07-1956         Patient Gender: M  Patient Age:   9 years Exam Location:  [facility] Procedure:      VAS [place] LOWER EXTREMITY VENOUS (DVT) Referring Phys: Kalman Shan --------------------------------------------------------------------------------  Indications: "edema" per MD order.  Comparison Study: No previous exams Performing Technologist: Jody Hill RVT, RDMS  Examination Guidelines: A complete evaluation includes B-mode imaging, spectral Doppler, color Doppler, and power Doppler as needed of all accessible portions of each vessel. Bilateral testing is considered an integral part of a complete examination. Limited examinations for reoccurring indications may be performed as noted. The reflux portion of the exam is performed with the patient in reverse Trendelenburg.  +---------+---------------+---------+-----------+----------+--------------+ RIGHT    CompressibilityPhasicitySpontaneityPropertiesThrombus Aging +---------+---------------+---------+-----------+----------+--------------+ CFV      Full           Yes      Yes                                 +---------+---------------+---------+-----------+----------+--------------+ SFJ      Full                                                        +---------+---------------+---------+-----------+----------+--------------+ FV Prox  Full           Yes      Yes                                 +---------+---------------+---------+-----------+----------+--------------+  PROGRESS NOTE        PATIENT DETAILS Name: Garrett Fleming Age: 67 y.o. Sex: male Date of Birth: 04/23/1956 Admit Date: 06/21/2023 Admitting Physician Garrett Glass, MD NFA:OZHYQM, Garrett Bryant, MD  Brief Summary: Patient is a 67 y.o.  male with known history of laryngeal cancer (recent PEG tube insertion-followed at [facility])-presented with respiratory distress-required emergent tracheostomy by ENT.  See below for further details.  Significant events: 9/22>> Admit with emergent Trach by ENT 9/24>> transferred to [facility]  Significant studies:   Significant microbiology data: 9/22>> blood culture: No growth 9/22>> influenza/COVID PCR/RSV PCR: Negative  Procedures: 9/22>> tracheotomy by ENT  Consults: ENT PCCM  Subjective: No major issues overnight.  Objective: Vitals: Blood pressure 122/78, pulse 64, temperature 99.2 F (37.3 C), temperature source Oral, resp. rate (!) 24, weight 58.8 kg, SpO2 96%.   Exam: Gen Exam:Alert awake-not in any distress HEENT:atraumatic, normocephalic Chest: B/L clear to auscultation anteriorly CVS:S1S2 regular Abdomen:soft non tender, non distended Extremities:no edema Neurology: Non focal Skin: no rash  Pertinent Labs/Radiology:    Latest Ref Rng & Units 06/24/2023    4:38 AM 06/23/2023    5:09 AM 06/22/2023    3:58 AM  CBC  WBC 4.0 - 10.5 K/uL 7.9  10.3  13.7   Hemoglobin 13.0 - 17.0 g/dL 57.8  46.9  62.9   Hematocrit 39.0 - 52.0 % 34.7  35.1  32.2   Platelets 150 - 400 K/uL 239  257  264     Lab Results  Component Value Date   NA 140 06/24/2023   K 3.6 06/24/2023   CL 103 06/24/2023   CO2 28 06/24/2023      Assessment/Plan: Acute hypoxic respiratory failure secondary to obstructing laryngeal mass-known history of laryngeal cancer S/p emergent tracheotomy by ENT on 9/22 Currently stable-on 6 L via trach collar Trach change on postoperative day 5 per ENT Plans are for continued follow-up  with ENT/oncology at [facility].  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  AKI Hemodynamically mediated Resolved  Normocytic anemia Secondary to underlying malignancy Hb stable  HTN BP stable Amlodipine  Nutrition Status: Nutrition Problem: Severe Malnutrition Etiology: chronic illness (laryngeal mass) Signs/Symptoms: severe fat depletion, severe muscle depletion Interventions: Refer to RD note for recommendations, Tube feeding  Underweight: Estimated body mass index is 15.78 kg/m as calculated from the following:   Height as of 05/04/23: 6\' 4"  (1.93 m).   Weight as of this encounter: 58.8 kg.   Code status:   Code Status: Full Code   DVT Prophylaxis: enoxaparin (LOVENOX) injection 40 mg Start: 06/22/23 1700 SCDs Start: 06/21/23 1845   Family Communication: Spouse at bedside   Disposition Plan: Status is: Inpatient Remains inpatient appropriate because: Severity of illness   Planned Discharge Destination:Home   Diet: Diet Order             Diet NPO time specified  Diet effective now                     Antimicrobial agents: Anti-infectives (From admission, onward)    Start     Dose/Rate Route Frequency Ordered Stop   06/21/23 2300  Ampicillin-Sulbactam (UNASYN) 3 g in sodium chloride 0.9 % 100 mL IVPB  DAYS Performed at [facility], [address], [place], [place] [address]    Report Status PENDING  Incomplete  MRSA Next Gen by PCR, Nasal     Status: None   Collection Time: 06/21/23  8:20 PM   Specimen: Nasal Mucosa; Nasal Swab  Result Value Ref Range Status   MRSA by PCR Next Gen NOT DETECTED NOT DETECTED Final    Comment: (NOTE) The GeneXpert MRSA Assay (FDA approved for NASAL specimens only), is one component of a comprehensive MRSA colonization surveillance program. It is not intended to diagnose MRSA infection nor to guide or monitor treatment for MRSA infections. Test performance is not FDA approved in patients less than 9 years old. Performed at [facility], [address] [place], [place] [address]     RADIOLOGY STUDIES/RESULTS: DG CHEST PORT 1 VIEW  Result Date: 06/25/2023 CLINICAL DATA:  Tracheostomy care. EXAM: PORTABLE CHEST 1 VIEW COMPARISON:  06/22/2023 FINDINGS: Tracheostomy tube unchanged in positioning with tip over the trachea at the level of the clavicular heads. Aortic stent graft unchanged in position. Stable heart size and mediastinal contours. Subcutaneous emphysema in the right greater than left chest wall, improving. No visible pneumothorax. No significant pleural effusion. No focal airspace disease. IMPRESSION: 1. Tracheostomy tube unchanged in positioning with tip over the trachea at the level of the clavicular heads. 2. Improving subcutaneous emphysema. Electronically Signed   By: Narda Rutherford M.D.   On: 06/25/2023 10:22   VAS [place] LOWER EXTREMITY VENOUS (DVT)  Result Date: 06/24/2023  Lower Venous DVT Study Patient Name:  Garrett Fleming  Date of Exam:   06/24/2023 Medical Rec #: [id number]         Accession #:    [id number] Date of Birth: 08-07-1956         Patient Gender: M  Patient Age:   9 years Exam Location:  [facility] Procedure:      VAS [place] LOWER EXTREMITY VENOUS (DVT) Referring Phys: Kalman Shan --------------------------------------------------------------------------------  Indications: "edema" per MD order.  Comparison Study: No previous exams Performing Technologist: Jody Hill RVT, RDMS  Examination Guidelines: A complete evaluation includes B-mode imaging, spectral Doppler, color Doppler, and power Doppler as needed of all accessible portions of each vessel. Bilateral testing is considered an integral part of a complete examination. Limited examinations for reoccurring indications may be performed as noted. The reflux portion of the exam is performed with the patient in reverse Trendelenburg.  +---------+---------------+---------+-----------+----------+--------------+ RIGHT    CompressibilityPhasicitySpontaneityPropertiesThrombus Aging +---------+---------------+---------+-----------+----------+--------------+ CFV      Full           Yes      Yes                                 +---------+---------------+---------+-----------+----------+--------------+ SFJ      Full                                                        +---------+---------------+---------+-----------+----------+--------------+ FV Prox  Full           Yes      Yes                                 +---------+---------------+---------+-----------+----------+--------------+

## 2023-06-26 DIAGNOSIS — R061 Stridor: Secondary | ICD-10-CM | POA: Diagnosis not present

## 2023-06-26 DIAGNOSIS — R0902 Hypoxemia: Secondary | ICD-10-CM | POA: Diagnosis not present

## 2023-06-26 DIAGNOSIS — E43 Unspecified severe protein-calorie malnutrition: Secondary | ICD-10-CM | POA: Diagnosis not present

## 2023-06-26 DIAGNOSIS — Z430 Encounter for attention to tracheostomy: Secondary | ICD-10-CM | POA: Diagnosis not present

## 2023-06-26 LAB — GLUCOSE, CAPILLARY
Glucose-Capillary: 110 mg/dL — ABNORMAL HIGH (ref 70–99)
Glucose-Capillary: 114 mg/dL — ABNORMAL HIGH (ref 70–99)
Glucose-Capillary: 116 mg/dL — ABNORMAL HIGH (ref 70–99)
Glucose-Capillary: 127 mg/dL — ABNORMAL HIGH (ref 70–99)
Glucose-Capillary: 135 mg/dL — ABNORMAL HIGH (ref 70–99)

## 2023-06-26 NOTE — Progress Notes (Signed)
or hilar adenopathy, given limitations of unenhanced CT. Lungs/Pleura: Trace bilateral pleural fluid.  No pneumothorax. Tracheostomy appropriately positioned, well above the carina. Fluid within the dependent trachea and both mainstem bronchi. Development of mild areas of peribronchovascular right middle and right lower lobe airspace and ground-glass opacity. Upper Abdomen: Artifact degradation from patient arm position. Left hepatic lobe cyst. Musculoskeletal: No acute osseous abnormality. IMPRESSION: 1. Right middle and right lower lobe peribronchovascular airspace and ground-glass opacities, favoring infection or aspiration. Fluid in the endobronchial tree for which aspiration should be clinically excluded. 2. Pneumomediastinum and subcutaneous emphysema, as on multiple prior chest radiographs. 3. Trace bilateral pleural fluid 4.  Aortic Atherosclerosis (ICD10-I70.0). Electronically Signed   By: Jeronimo Greaves M.D.   On: 06/25/2023 16:32   DG CHEST PORT 1 VIEW  Result Date: 06/25/2023 CLINICAL DATA:  Tracheostomy care. EXAM: PORTABLE CHEST 1 VIEW COMPARISON:  06/22/2023 FINDINGS: Tracheostomy tube unchanged in positioning with tip over the trachea at the level of the clavicular heads. Aortic stent graft unchanged in position. Stable heart size and mediastinal contours. Subcutaneous emphysema in the right greater than left chest wall, improving. No visible pneumothorax. No significant pleural effusion. No focal airspace disease. IMPRESSION: 1. Tracheostomy tube unchanged  in positioning with tip over the trachea at the level of the clavicular heads. 2. Improving subcutaneous emphysema. Electronically Signed   By: Narda Rutherford M.D.   On: 06/25/2023 10:22   VAS [place] LOWER EXTREMITY VENOUS (DVT)  Result Date: 06/24/2023  Lower Venous DVT Study Patient Name:  Garrett Fleming  Date of Exam:   06/24/2023 Medical Rec #: [id number]         Accession #:    [id number] Date of Birth: 07/20/1956         Patient Gender: M Patient Age:   64 years Exam Location:  [facility] Procedure:      VAS [place] LOWER EXTREMITY VENOUS (DVT) Referring Phys: Kalman Shan --------------------------------------------------------------------------------  Indications: "edema" per MD order.  Comparison Study: No previous exams Performing Technologist: Jody Hill RVT, RDMS  Examination Guidelines: A complete evaluation includes B-mode imaging, spectral Doppler, color Doppler, and power Doppler as needed of all accessible portions of each vessel. Bilateral testing is considered an integral part of a complete examination. Limited examinations for reoccurring indications may be performed as noted. The reflux portion of the exam is performed with the patient in reverse Trendelenburg.  +---------+---------------+---------+-----------+----------+--------------+ RIGHT    CompressibilityPhasicitySpontaneityPropertiesThrombus Aging +---------+---------------+---------+-----------+----------+--------------+ CFV      Full           Yes      Yes                                 +---------+---------------+---------+-----------+----------+--------------+ SFJ      Full                                                        +---------+---------------+---------+-----------+----------+--------------+ FV Prox  Full           Yes      Yes                                 +---------+---------------+---------+-----------+----------+--------------+ FV Mid  PROGRESS NOTE        PATIENT DETAILS Name: Garrett Fleming Age: 67 y.o. Sex: male Date of Birth: Apr 21, 1956 Admit Date: 06/21/2023 Admitting Physician Lorin Glass, MD WUJ:[id number], [name], MD  Brief Summary: Patient is a 67 y.o.  male with known history of laryngeal cancer (recent PEG tube insertion-followed at [facility])-presented with respiratory distress-required emergent tracheostomy by ENT.  See below for further details.  Significant events: 9/22>> Admit with emergent Trach by ENT 9/24>> transferred to [facility]  Significant studies:   Significant microbiology data: 9/22>> blood culture: No growth 9/22>> influenza/COVID PCR/RSV PCR: Negative  Procedures: 9/22>> tracheotomy by ENT  Consults: ENT PCCM  Subjective: No issues-informed by nursing staff that patient does not have electricity/water in his house and-he has been living in a local motel with his wife.  Unfortunately they do not have any financial resources to continue to live in the motel anymore until 10/3 when they get their check.  Objective: Vitals: Blood pressure 134/77, pulse 72, temperature 98.9 F (37.2 C), temperature source Oral, resp. rate 16, weight 58.2 kg, SpO2 100%.   Exam: Gen Exam:Alert awake-not in any distress HEENT:atraumatic, normocephalic Chest: B/L clear to auscultation anteriorly CVS:S1S2 regular Abdomen:soft non tender, non distended Extremities:no edema Neurology: Non focal Skin: no rash  Pertinent Labs/Radiology:    Latest Ref Rng & Units 06/24/2023    4:38 AM 06/23/2023    5:09 AM 06/22/2023    3:58 AM  CBC  WBC 4.0 - 10.5 K/uL 7.9  10.3  13.7   Hemoglobin 13.0 - 17.0 g/dL 78.2  95.6  21.3   Hematocrit 39.0 - 52.0 % 34.7  35.1  32.2   Platelets 150 - 400 K/uL 239  257  264     Lab Results  Component Value Date   NA 140 06/24/2023   K 3.6 06/24/2023   CL 103 06/24/2023   CO2 28 06/24/2023      Assessment/Plan: Acute hypoxic  respiratory failure secondary to obstructing laryngeal mass-known history of laryngeal cancer S/p emergent tracheotomy by ENT on 9/22 Currently stable-on 6 L via trach collar Trach change scheduled for later today by ENT Per spouse-patient has an appointment with ENT at [facility] on 10/12.    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  AKI Hemodynamically mediated Resolved  Normocytic anemia Secondary to underlying malignancy Hb stable  HTN BP stable Amlodipine  Nutrition Status: Nutrition Problem: Severe Malnutrition Etiology: chronic illness (laryngeal mass) Signs/Symptoms: severe fat depletion, severe muscle depletion Interventions: Refer to RD note for recommendations, Tube feeding  Underweight: Estimated body mass index is 15.62 kg/m as calculated from the following:   Height as of 05/04/23: 6\' 4"  (1.93 m).   Weight as of this encounter: 58.2 kg.   Code status:   Code Status: Full Code   DVT Prophylaxis: enoxaparin (LOVENOX) injection 40 mg Start: 06/22/23 1700 SCDs Start: 06/21/23 1845   Family Communication: Spouse at bedside   Disposition Plan: Status is: Inpatient Remains inpatient appropriate because: Severity of illness   Planned Discharge Destination:Home when appropriate by [facility] team-after trach change today.   Diet: Diet Order             Diet NPO time specified  Diet effective now  PROGRESS NOTE        PATIENT DETAILS Name: Garrett Fleming Age: 67 y.o. Sex: male Date of Birth: Apr 21, 1956 Admit Date: 06/21/2023 Admitting Physician Lorin Glass, MD WUJ:[id number], [name], MD  Brief Summary: Patient is a 67 y.o.  male with known history of laryngeal cancer (recent PEG tube insertion-followed at [facility])-presented with respiratory distress-required emergent tracheostomy by ENT.  See below for further details.  Significant events: 9/22>> Admit with emergent Trach by ENT 9/24>> transferred to [facility]  Significant studies:   Significant microbiology data: 9/22>> blood culture: No growth 9/22>> influenza/COVID PCR/RSV PCR: Negative  Procedures: 9/22>> tracheotomy by ENT  Consults: ENT PCCM  Subjective: No issues-informed by nursing staff that patient does not have electricity/water in his house and-he has been living in a local motel with his wife.  Unfortunately they do not have any financial resources to continue to live in the motel anymore until 10/3 when they get their check.  Objective: Vitals: Blood pressure 134/77, pulse 72, temperature 98.9 F (37.2 C), temperature source Oral, resp. rate 16, weight 58.2 kg, SpO2 100%.   Exam: Gen Exam:Alert awake-not in any distress HEENT:atraumatic, normocephalic Chest: B/L clear to auscultation anteriorly CVS:S1S2 regular Abdomen:soft non tender, non distended Extremities:no edema Neurology: Non focal Skin: no rash  Pertinent Labs/Radiology:    Latest Ref Rng & Units 06/24/2023    4:38 AM 06/23/2023    5:09 AM 06/22/2023    3:58 AM  CBC  WBC 4.0 - 10.5 K/uL 7.9  10.3  13.7   Hemoglobin 13.0 - 17.0 g/dL 78.2  95.6  21.3   Hematocrit 39.0 - 52.0 % 34.7  35.1  32.2   Platelets 150 - 400 K/uL 239  257  264     Lab Results  Component Value Date   NA 140 06/24/2023   K 3.6 06/24/2023   CL 103 06/24/2023   CO2 28 06/24/2023      Assessment/Plan: Acute hypoxic  respiratory failure secondary to obstructing laryngeal mass-known history of laryngeal cancer S/p emergent tracheotomy by ENT on 9/22 Currently stable-on 6 L via trach collar Trach change scheduled for later today by ENT Per spouse-patient has an appointment with ENT at [facility] on 10/12.    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  AKI Hemodynamically mediated Resolved  Normocytic anemia Secondary to underlying malignancy Hb stable  HTN BP stable Amlodipine  Nutrition Status: Nutrition Problem: Severe Malnutrition Etiology: chronic illness (laryngeal mass) Signs/Symptoms: severe fat depletion, severe muscle depletion Interventions: Refer to RD note for recommendations, Tube feeding  Underweight: Estimated body mass index is 15.62 kg/m as calculated from the following:   Height as of 05/04/23: 6\' 4"  (1.93 m).   Weight as of this encounter: 58.2 kg.   Code status:   Code Status: Full Code   DVT Prophylaxis: enoxaparin (LOVENOX) injection 40 mg Start: 06/22/23 1700 SCDs Start: 06/21/23 1845   Family Communication: Spouse at bedside   Disposition Plan: Status is: Inpatient Remains inpatient appropriate because: Severity of illness   Planned Discharge Destination:Home when appropriate by [facility] team-after trach change today.   Diet: Diet Order             Diet NPO time specified  Diet effective now  or hilar adenopathy, given limitations of unenhanced CT. Lungs/Pleura: Trace bilateral pleural fluid.  No pneumothorax. Tracheostomy appropriately positioned, well above the carina. Fluid within the dependent trachea and both mainstem bronchi. Development of mild areas of peribronchovascular right middle and right lower lobe airspace and ground-glass opacity. Upper Abdomen: Artifact degradation from patient arm position. Left hepatic lobe cyst. Musculoskeletal: No acute osseous abnormality. IMPRESSION: 1. Right middle and right lower lobe peribronchovascular airspace and ground-glass opacities, favoring infection or aspiration. Fluid in the endobronchial tree for which aspiration should be clinically excluded. 2. Pneumomediastinum and subcutaneous emphysema, as on multiple prior chest radiographs. 3. Trace bilateral pleural fluid 4.  Aortic Atherosclerosis (ICD10-I70.0). Electronically Signed   By: Jeronimo Greaves M.D.   On: 06/25/2023 16:32   DG CHEST PORT 1 VIEW  Result Date: 06/25/2023 CLINICAL DATA:  Tracheostomy care. EXAM: PORTABLE CHEST 1 VIEW COMPARISON:  06/22/2023 FINDINGS: Tracheostomy tube unchanged in positioning with tip over the trachea at the level of the clavicular heads. Aortic stent graft unchanged in position. Stable heart size and mediastinal contours. Subcutaneous emphysema in the right greater than left chest wall, improving. No visible pneumothorax. No significant pleural effusion. No focal airspace disease. IMPRESSION: 1. Tracheostomy tube unchanged  in positioning with tip over the trachea at the level of the clavicular heads. 2. Improving subcutaneous emphysema. Electronically Signed   By: Narda Rutherford M.D.   On: 06/25/2023 10:22   VAS [place] LOWER EXTREMITY VENOUS (DVT)  Result Date: 06/24/2023  Lower Venous DVT Study Patient Name:  Garrett Fleming  Date of Exam:   06/24/2023 Medical Rec #: [id number]         Accession #:    [id number] Date of Birth: 07/20/1956         Patient Gender: M Patient Age:   64 years Exam Location:  [facility] Procedure:      VAS [place] LOWER EXTREMITY VENOUS (DVT) Referring Phys: Kalman Shan --------------------------------------------------------------------------------  Indications: "edema" per MD order.  Comparison Study: No previous exams Performing Technologist: Jody Hill RVT, RDMS  Examination Guidelines: A complete evaluation includes B-mode imaging, spectral Doppler, color Doppler, and power Doppler as needed of all accessible portions of each vessel. Bilateral testing is considered an integral part of a complete examination. Limited examinations for reoccurring indications may be performed as noted. The reflux portion of the exam is performed with the patient in reverse Trendelenburg.  +---------+---------------+---------+-----------+----------+--------------+ RIGHT    CompressibilityPhasicitySpontaneityPropertiesThrombus Aging +---------+---------------+---------+-----------+----------+--------------+ CFV      Full           Yes      Yes                                 +---------+---------------+---------+-----------+----------+--------------+ SFJ      Full                                                        +---------+---------------+---------+-----------+----------+--------------+ FV Prox  Full           Yes      Yes                                 +---------+---------------+---------+-----------+----------+--------------+ FV Mid  PROGRESS NOTE        PATIENT DETAILS Name: Garrett Fleming Age: 67 y.o. Sex: male Date of Birth: Apr 21, 1956 Admit Date: 06/21/2023 Admitting Physician Lorin Glass, MD WUJ:WJXBJY, [name], MD  Brief Summary: Patient is a 67 y.o.  male with known history of laryngeal cancer (recent PEG tube insertion-followed at [facility])-presented with respiratory distress-required emergent tracheostomy by ENT.  See below for further details.  Significant events: 9/22>> Admit with emergent Trach by ENT 9/24>> transferred to [facility]  Significant studies:   Significant microbiology data: 9/22>> blood culture: No growth 9/22>> influenza/COVID PCR/RSV PCR: Negative  Procedures: 9/22>> tracheotomy by ENT  Consults: ENT PCCM  Subjective: No issues-informed by nursing staff that patient does not have electricity/water in his house and-he has been living in a local motel with his wife.  Unfortunately they do not have any financial resources to continue to live in the motel anymore until 10/3 when they get their check.  Objective: Vitals: Blood pressure 134/77, pulse 72, temperature 98.9 F (37.2 C), temperature source Oral, resp. rate 16, weight 58.2 kg, SpO2 100%.   Exam: Gen Exam:Alert awake-not in any distress HEENT:atraumatic, normocephalic Chest: B/L clear to auscultation anteriorly CVS:S1S2 regular Abdomen:soft non tender, non distended Extremities:no edema Neurology: Non focal Skin: no rash  Pertinent Labs/Radiology:    Latest Ref Rng & Units 06/24/2023    4:38 AM 06/23/2023    5:09 AM 06/22/2023    3:58 AM  CBC  WBC 4.0 - 10.5 K/uL 7.9  10.3  13.7   Hemoglobin 13.0 - 17.0 g/dL 78.2  95.6  21.3   Hematocrit 39.0 - 52.0 % 34.7  35.1  32.2   Platelets 150 - 400 K/uL 239  257  264     Lab Results  Component Value Date   NA 140 06/24/2023   K 3.6 06/24/2023   CL 103 06/24/2023   CO2 28 06/24/2023      Assessment/Plan: Acute hypoxic  respiratory failure secondary to obstructing laryngeal mass-known history of laryngeal cancer S/p emergent tracheotomy by ENT on 9/22 Currently stable-on 6 L via trach collar Trach change scheduled for later today by ENT Per spouse-patient has an appointment with ENT at [facility] on 10/12.    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  AKI Hemodynamically mediated Resolved  Normocytic anemia Secondary to underlying malignancy Hb stable  HTN BP stable Amlodipine  Nutrition Status: Nutrition Problem: Severe Malnutrition Etiology: chronic illness (laryngeal mass) Signs/Symptoms: severe fat depletion, severe muscle depletion Interventions: Refer to RD note for recommendations, Tube feeding  Underweight: Estimated body mass index is 15.62 kg/m as calculated from the following:   Height as of 05/04/23: 6\' 4"  (1.93 m).   Weight as of this encounter: 58.2 kg.   Code status:   Code Status: Full Code   DVT Prophylaxis: enoxaparin (LOVENOX) injection 40 mg Start: 06/22/23 1700 SCDs Start: 06/21/23 1845   Family Communication: Spouse at bedside   Disposition Plan: Status is: Inpatient Remains inpatient appropriate because: Severity of illness   Planned Discharge Destination:Home when appropriate by [facility] team-after trach change today.   Diet: Diet Order             Diet NPO time specified  Diet effective now  PROGRESS NOTE        PATIENT DETAILS Name: Garrett Fleming Age: 67 y.o. Sex: male Date of Birth: Apr 21, 1956 Admit Date: 06/21/2023 Admitting Physician Lorin Glass, MD WUJ:WJXBJY, [name], MD  Brief Summary: Patient is a 67 y.o.  male with known history of laryngeal cancer (recent PEG tube insertion-followed at [facility])-presented with respiratory distress-required emergent tracheostomy by ENT.  See below for further details.  Significant events: 9/22>> Admit with emergent Trach by ENT 9/24>> transferred to [facility]  Significant studies:   Significant microbiology data: 9/22>> blood culture: No growth 9/22>> influenza/COVID PCR/RSV PCR: Negative  Procedures: 9/22>> tracheotomy by ENT  Consults: ENT PCCM  Subjective: No issues-informed by nursing staff that patient does not have electricity/water in his house and-he has been living in a local motel with his wife.  Unfortunately they do not have any financial resources to continue to live in the motel anymore until 10/3 when they get their check.  Objective: Vitals: Blood pressure 134/77, pulse 72, temperature 98.9 F (37.2 C), temperature source Oral, resp. rate 16, weight 58.2 kg, SpO2 100%.   Exam: Gen Exam:Alert awake-not in any distress HEENT:atraumatic, normocephalic Chest: B/L clear to auscultation anteriorly CVS:S1S2 regular Abdomen:soft non tender, non distended Extremities:no edema Neurology: Non focal Skin: no rash  Pertinent Labs/Radiology:    Latest Ref Rng & Units 06/24/2023    4:38 AM 06/23/2023    5:09 AM 06/22/2023    3:58 AM  CBC  WBC 4.0 - 10.5 K/uL 7.9  10.3  13.7   Hemoglobin 13.0 - 17.0 g/dL 78.2  95.6  21.3   Hematocrit 39.0 - 52.0 % 34.7  35.1  32.2   Platelets 150 - 400 K/uL 239  257  264     Lab Results  Component Value Date   NA 140 06/24/2023   K 3.6 06/24/2023   CL 103 06/24/2023   CO2 28 06/24/2023      Assessment/Plan: Acute hypoxic  respiratory failure secondary to obstructing laryngeal mass-known history of laryngeal cancer S/p emergent tracheotomy by ENT on 9/22 Currently stable-on 6 L via trach collar Trach change scheduled for later today by ENT Per spouse-patient has an appointment with ENT at [facility] on 10/12.    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  AKI Hemodynamically mediated Resolved  Normocytic anemia Secondary to underlying malignancy Hb stable  HTN BP stable Amlodipine  Nutrition Status: Nutrition Problem: Severe Malnutrition Etiology: chronic illness (laryngeal mass) Signs/Symptoms: severe fat depletion, severe muscle depletion Interventions: Refer to RD note for recommendations, Tube feeding  Underweight: Estimated body mass index is 15.62 kg/m as calculated from the following:   Height as of 05/04/23: 6\' 4"  (1.93 m).   Weight as of this encounter: 58.2 kg.   Code status:   Code Status: Full Code   DVT Prophylaxis: enoxaparin (LOVENOX) injection 40 mg Start: 06/22/23 1700 SCDs Start: 06/21/23 1845   Family Communication: Spouse at bedside   Disposition Plan: Status is: Inpatient Remains inpatient appropriate because: Severity of illness   Planned Discharge Destination:Home when appropriate by [facility] team-after trach change today.   Diet: Diet Order             Diet NPO time specified  Diet effective now  or hilar adenopathy, given limitations of unenhanced CT. Lungs/Pleura: Trace bilateral pleural fluid.  No pneumothorax. Tracheostomy appropriately positioned, well above the carina. Fluid within the dependent trachea and both mainstem bronchi. Development of mild areas of peribronchovascular right middle and right lower lobe airspace and ground-glass opacity. Upper Abdomen: Artifact degradation from patient arm position. Left hepatic lobe cyst. Musculoskeletal: No acute osseous abnormality. IMPRESSION: 1. Right middle and right lower lobe peribronchovascular airspace and ground-glass opacities, favoring infection or aspiration. Fluid in the endobronchial tree for which aspiration should be clinically excluded. 2. Pneumomediastinum and subcutaneous emphysema, as on multiple prior chest radiographs. 3. Trace bilateral pleural fluid 4.  Aortic Atherosclerosis (ICD10-I70.0). Electronically Signed   By: Jeronimo Greaves M.D.   On: 06/25/2023 16:32   DG CHEST PORT 1 VIEW  Result Date: 06/25/2023 CLINICAL DATA:  Tracheostomy care. EXAM: PORTABLE CHEST 1 VIEW COMPARISON:  06/22/2023 FINDINGS: Tracheostomy tube unchanged in positioning with tip over the trachea at the level of the clavicular heads. Aortic stent graft unchanged in position. Stable heart size and mediastinal contours. Subcutaneous emphysema in the right greater than left chest wall, improving. No visible pneumothorax. No significant pleural effusion. No focal airspace disease. IMPRESSION: 1. Tracheostomy tube unchanged  in positioning with tip over the trachea at the level of the clavicular heads. 2. Improving subcutaneous emphysema. Electronically Signed   By: Narda Rutherford M.D.   On: 06/25/2023 10:22   VAS [place] LOWER EXTREMITY VENOUS (DVT)  Result Date: 06/24/2023  Lower Venous DVT Study Patient Name:  Garrett Fleming  Date of Exam:   06/24/2023 Medical Rec #: [id number]         Accession #:    [id number] Date of Birth: 07/20/1956         Patient Gender: M Patient Age:   64 years Exam Location:  [facility] Procedure:      VAS [place] LOWER EXTREMITY VENOUS (DVT) Referring Phys: Kalman Shan --------------------------------------------------------------------------------  Indications: "edema" per MD order.  Comparison Study: No previous exams Performing Technologist: Jody Hill RVT, RDMS  Examination Guidelines: A complete evaluation includes B-mode imaging, spectral Doppler, color Doppler, and power Doppler as needed of all accessible portions of each vessel. Bilateral testing is considered an integral part of a complete examination. Limited examinations for reoccurring indications may be performed as noted. The reflux portion of the exam is performed with the patient in reverse Trendelenburg.  +---------+---------------+---------+-----------+----------+--------------+ RIGHT    CompressibilityPhasicitySpontaneityPropertiesThrombus Aging +---------+---------------+---------+-----------+----------+--------------+ CFV      Full           Yes      Yes                                 +---------+---------------+---------+-----------+----------+--------------+ SFJ      Full                                                        +---------+---------------+---------+-----------+----------+--------------+ FV Prox  Full           Yes      Yes                                 +---------+---------------+---------+-----------+----------+--------------+ FV Mid

## 2023-06-26 NOTE — Evaluation (Signed)
Occupational Therapy Evaluation Patient Details Name: Garrett Fleming MRN: [id number] DOB: Sep 12, 1956 Today's Date: 06/26/2023   History of Present Illness 67 y.o. male admitted 9/22 with hypoxemic respiratory failure due to airway stenosis with infection s/p emergent awake tracheostomy. PMxh: larynx CA, left ankle fx, HTN, smoker, cocaine abuse, aortic aneurysm, blind Rt eye, PEG   Clinical Impression   Pt admitted for above, presents as generally weak but overall supervision for close stand pivot transfers. Pt/spouse were educated on trach care, need reinforcement of education. Recommend pt receive BSC at DC, would be beneficial since pt limited in community due to trach precautions and potential inadequate access to toilet at home. Ot to continue to follow pt acutely to progress with deficits and educate them as needed in order to safely DC home. Anticipate no post acute OT needed.       If plan is discharge home, recommend the following: Assistance with cooking/housework;A little help with bathing/dressing/bathroom    Functional Status Assessment  Patient has had a recent decline in their functional status and demonstrates the ability to make significant improvements in function in a reasonable and predictable amount of time.  Equipment Recommendations  BSC/3in1    Recommendations for Other Services       Precautions / Restrictions Precautions Precautions: Fall;Other (comment) Precaution Comments: PEG, trach Restrictions Weight Bearing Restrictions: No      Mobility Bed Mobility Overal bed mobility: Modified Independent Bed Mobility: Supine to Sit, Sit to Supine     Supine to sit: Modified independent (Device/Increase time), HOB elevated, Used rails Sit to supine: Modified independent (Device/Increase time), HOB elevated   General bed mobility comments: assist for lines/leads    Transfers Overall transfer level: Needs assistance Equipment used: None Transfers: Sit  to/from Stand, Bed to chair/wheelchair/BSC Sit to Stand: Supervision Stand pivot transfers: Supervision         General transfer comment: no AD      Balance Overall balance assessment: Mild deficits observed, not formally tested   Sitting balance-Leahy Scale: Good     Standing balance support: During functional activity, Bilateral upper extremity supported Standing balance-Leahy Scale: Fair Standing balance comment: no LOB during transfer or sit/stand                           ADL either performed or assessed with clinical judgement   ADL Overall ADL's : Needs assistance/impaired Eating/Feeding: Sitting;NPO Eating/Feeding Details (indicate cue type and reason): on tube feeds Grooming: Set up;Sitting;Wash/dry face   Upper Body Bathing: Sitting;Set up;Supervision/ safety   Lower Body Bathing: Sitting/lateral leans;Supervison/ safety   Upper Body Dressing : Sitting;Set up   Lower Body Dressing: Sitting/lateral leans;Minimal assistance Lower Body Dressing Details (indicate cue type and reason): wife helps at baseline with starting socks over toes Toilet Transfer: BSC/3in1;Supervision/safety;Stand-pivot Toilet Transfer Details (indicate cue type and reason): assist with line/lead managment Toileting- Clothing Manipulation and Hygiene: Supervision/safety;Set up;Sit to/from stand Toileting - Clothing Manipulation Details (indicate cue type and reason): self pericare       General ADL Comments: Pt recently returned from mobility with PT, educated pt and spouse on proper trach care (handout provided). Also discussed the use of written communication at this time for better ways of social communication.     Vision   Additional Comments: blind in R eye     Perception         Praxis         Pertinent Vitals/Pain Pain  Assessment Pain Assessment: Faces Faces Pain Scale: Hurts a little bit Pain Location: trach site Pain Descriptors / Indicators:  Discomfort Pain Intervention(s): Monitored during session     Extremity/Trunk Assessment Upper Extremity Assessment Upper Extremity Assessment: Generalized weakness   Lower Extremity Assessment Lower Extremity Assessment: Generalized weakness   Cervical / Trunk Assessment Cervical / Trunk Assessment: Normal   Communication Communication Communication: Tracheostomy Cueing Techniques: Gestural cues;Verbal cues   Cognition Arousal: Alert Behavior During Therapy: WFL for tasks assessed/performed Overall Cognitive Status: Within Functional Limits for tasks assessed                                       General Comments  Pt on 28% FiO2, SpO2 >/= 90% via trach collar.    Exercises     Shoulder Instructions      Home Living Family/patient expects to be discharged to:: Private residence Living Arrangements: Spouse/significant other Available Help at Discharge: Family;Available 24 hours/day Type of Home: Other(Comment) (RW) Home Access: Stairs to enter Entrance Stairs-Number of Steps: 3   Home Layout: One level     Bathroom Shower/Tub: Sponge bathes at baseline   Bathroom Toilet: Standard Bathroom Accessibility: Yes   Home Equipment: None   Additional Comments: pt and wife living in an RV, sponge bathing due to lack of shower. Wife states they walk to toilet (? bath house) wife uses rollator      Prior Functioning/Environment Prior Level of Function : Independent/Modified Independent                        OT Problem List: Decreased strength;Impaired balance (sitting and/or standing);Other (comment);Decreased knowledge of precautions Janina Mayo)      OT Treatment/Interventions: Self-care/ADL training;Balance training;Therapeutic exercise;Therapeutic activities;Patient/family education    OT Goals(Current goals can be found in the care plan section) Acute Rehab OT Goals Patient Stated Goal: To go home OT Goal Formulation: With  patient/family Time For Goal Achievement: 07/10/23 Potential to Achieve Goals: Good ADL Goals Pt Will Perform Grooming: with modified independence;standing Pt Will Transfer to Toilet: with modified independence;ambulating Additional ADL Goal #1: Pt/spouse will verbalize and demonstrate proper trach care techniques independently Additional ADL Goal #2: Pt will identify 2-3 strategies for improved communication within social situations without cueing  OT Frequency: Min 1X/week    Co-evaluation              AM-PAC OT "6 Clicks" Daily Activity     Outcome Measure Help from another person eating meals?: Total (tube feeds) Help from another person taking care of personal grooming?: A Little Help from another person toileting, which includes using toliet, bedpan, or urinal?: A Little Help from another person bathing (including washing, rinsing, drying)?: A Little Help from another person to put on and taking off regular upper body clothing?: A Little Help from another person to put on and taking off regular lower body clothing?: A Little 6 Click Score: 16   End of Session Equipment Utilized During Treatment: Gait belt Nurse Communication: Mobility status  Activity Tolerance: Patient tolerated treatment well Patient left: in bed;with call bell/phone within reach;with bed alarm set;with family/visitor present  OT Visit Diagnosis: Unsteadiness on feet (R26.81);Muscle weakness (generalized) (M62.81);Other (comment) Janina Mayo)                Time: 5366-4403 OT Time Calculation (min): 23 min Charges:  OT General Charges $OT Visit: 1  Visit OT Evaluation $OT Eval Moderate Complexity: 1 Mod OT Treatments $Self Care/Home Management : 8-22 mins  06/26/2023  AB, OTR/L  Acute Rehabilitation Services  Office: [phone]   Tristan Schroeder 06/26/2023, 2:48 PM

## 2023-06-26 NOTE — Plan of Care (Signed)
  Problem: Education: Goal: Knowledge about tracheostomy care/management will improve Outcome: Progressing   Problem: Activity: Goal: Ability to tolerate increased activity will improve Outcome: Progressing   Problem: Health Behavior/Discharge Planning: Goal: Ability to manage tracheostomy will improve Outcome: Progressing   Problem: Respiratory: Goal: Patent airway maintenance will improve Outcome: Progressing   Problem: Role Relationship: Goal: Ability to communicate will improve Outcome: Progressing   Problem: Education: Goal: Knowledge of General Education information will improve Description: Including pain rating scale, medication(s)/side effects and non-pharmacologic comfort measures Outcome: Progressing   Problem: Health Behavior/Discharge Planning: Goal: Ability to manage health-related needs will improve Outcome: Progressing   Problem: Clinical Measurements: Goal: Ability to maintain clinical measurements within normal limits will improve Outcome: Progressing Goal: Will remain free from infection Outcome: Progressing Goal: Diagnostic test results will improve Outcome: Progressing Goal: Respiratory complications will improve Outcome: Progressing Goal: Cardiovascular complication will be avoided Outcome: Progressing   Problem: Activity: Goal: Risk for activity intolerance will decrease Outcome: Progressing   Problem: Nutrition: Goal: Adequate nutrition will be maintained Outcome: Progressing   Problem: Coping: Goal: Level of anxiety will decrease Outcome: Progressing   Problem: Elimination: Goal: Will not experience complications related to bowel motility Outcome: Progressing Goal: Will not experience complications related to urinary retention Outcome: Progressing   Problem: Pain Managment: Goal: General experience of comfort will improve Outcome: Progressing   Problem: Safety: Goal: Ability to remain free from injury will improve Outcome:  Progressing   Problem: Skin Integrity: Goal: Risk for impaired skin integrity will decrease Outcome: Progressing   

## 2023-06-26 NOTE — Progress Notes (Addendum)
Physical Therapy Treatment Patient Details Name: Garrett Fleming MRN: [id number] DOB: May 02, 1956 Today's Date: 06/26/2023   History of Present Illness 67 y.o. male admitted 9/22 with hypoxemic respiratory failure due to airway stenosis with infection s/p emergent awake tracheostomy. PMxh: larynx CA, left ankle fx, HTN, smoker, cocaine abuse, aortic aneurysm, blind Rt eye, PEG    PT Comments  Pt seen for PT tx with pt agreeable, wife present but exiting at beginning of session. Introduced idea of RW vs rollator to pt & wife with wife reporting pt would benefit more from rollator as they have to catch the bus & travel to [facility] to shower. PT provided pt with rollator to trial & pt demonstrates good awareness of how to manage brakes. Pt is able to ambulate with supervision increasing to CGA as he fatigues with some L knee instability noted. Pt would benefit from ongoing endurance, balance & strength training.     If plan is discharge home, recommend the following: Assistance with cooking/housework;A little help with bathing/dressing/bathroom;A little help with walking and/or transfers   Can travel by private vehicle        Equipment Recommendations  Rollator (4 wheels) -- tall as pt is 6'4"   Recommendations for Other Services       Precautions / Restrictions Precautions Precautions: Fall;Other (comment) Precaution Comments: PEG, trach Restrictions Weight Bearing Restrictions: No     Mobility  Bed Mobility Overal bed mobility: Modified Independent Bed Mobility: Supine to Sit, Sit to Supine     Supine to sit: Modified independent (Device/Increase time), HOB elevated, Used rails Sit to supine: Modified independent (Device/Increase time), HOB elevated        Transfers Overall transfer level: Needs assistance Equipment used: None Transfers: Sit to/from Stand Sit to Stand: Supervision           General transfer comment: STS from EOB, rollator seat, does well at managing  rollator brakes (PT does provide education/cuing)    Ambulation/Gait Ambulation/Gait assistance: Supervision, Contact guard assist Gait Distance (Feet): 75 Feet (+ 150 ft) Assistive device: Rollator (4 wheels) Gait Pattern/deviations: Trunk flexed, Shuffle, Decreased dorsiflexion - right Gait velocity: decreased     General Gait Details: 1 seated rest break 2/2 fatigue. Pt ambulates with supervision, increasing to CGA with pt demonstrating some L knee A/P instability as he fatigues; decreased RLE heel strike noted as well. Pt with forward trunk flexion onto rollator but rollator also not tall enough (regular height rollator & pt is 6'4").   Stairs             Wheelchair Mobility     Tilt Bed    Modified Rankin (Stroke Patients Only)       Balance Overall balance assessment: Mild deficits observed, not formally tested   Sitting balance-Leahy Scale: Good     Standing balance support: During functional activity, Bilateral upper extremity supported Standing balance-Leahy Scale: Fair                              Cognition Arousal: Alert Behavior During Therapy: WFL for tasks assessed/performed Overall Cognitive Status: Within Functional Limits for tasks assessed                                          Exercises      General Comments General comments (skin integrity, edema,  etc.): Pt on 28% FiO2, SpO2 >/= 90% via trach collar.      Pertinent Vitals/Pain Pain Assessment Pain Assessment: Faces Faces Pain Scale: Hurts a little bit Pain Location: trach site Pain Descriptors / Indicators: Discomfort Pain Intervention(s): Monitored during session     PT Goals (current goals can now be found in the care plan section) Acute Rehab PT Goals Patient Stated Goal: return home PT Goal Formulation: With patient/family Time For Goal Achievement: 07/06/23 Potential to Achieve Goals: Good Progress towards PT goals: Progressing toward  goals    Frequency    Min 1X/week      PT Plan      Co-evaluation              AM-PAC PT "6 Clicks" Mobility   Outcome Measure  Help needed turning from your back to your side while in a flat bed without using bedrails?: None Help needed moving from lying on your back to sitting on the side of a flat bed without using bedrails?: None Help needed moving to and from a bed to a chair (including a wheelchair)?: A Little Help needed standing up from a chair using your arms (e.g., wheelchair or bedside chair)?: A Little Help needed to walk in hospital room?: A Little Help needed climbing 3-5 steps with a railing? : A Little 6 Click Score: 20    End of Session Equipment Utilized During Treatment: Oxygen Activity Tolerance: Patient tolerated treatment well Patient left: in bed;with call bell/phone within reach;with bed alarm set (in hand off to OT)   PT Visit Diagnosis: Other abnormalities of gait and mobility (R26.89);Muscle weakness (generalized) (M62.81)     Time: 0865-7846 PT Time Calculation (min) (ACUTE ONLY): 22 min  Charges:    $Therapeutic Activity: 8-22 mins PT General Charges $$ ACUTE PT VISIT: 1 Visit                     Aleda Grana, PT, DPT 06/26/23, 1:17 PM    Sandi Mariscal 06/26/2023, 1:16 PM

## 2023-06-26 NOTE — Procedures (Signed)
TRACHEOSTOMY TUBE EXCHANGE NOTE  ID: 67 y/o male with hx of advanced laryngeal cancer  PROCEDURE: Tracheostomy tube exchange  Findings: Shiley 6-0 cuffed tube replaced with shiley 6-0 cuffless tube. Stoma patent.  Procedure details:  The patient was placed in supine position. The tracheostomy sutures were removed and ties released. Appropriate tracheostomy replacement was prepared with obturator. The tracheostomy was removed and stoma suctioned. The new shiley 6-0 cuffless tube was inserted into the tracheostomy and obturator removed. Inner cannula placed. Trachea suctioned with soft suction catheter. A tracheostomy collar was placed securing the tube.  Recommendations: - Routine tracheostomy tube care; futher tracheostomy tube changes by RT or Primary team - I emphasized to family the importance of close follow up with [facility] for Laryngectomy  [facility] and Neck Cancer Coordinator  Sherald Barge [phone], option #4, then option #6.  Electronically signed by:  Scarlette Ar, MD  Staff Physician Facial Plastic & Reconstructive Surgery Otolaryngology - Head and Neck Surgery [facility] [place]

## 2023-06-27 ENCOUNTER — Inpatient Hospital Stay (HOSPITAL_COMMUNITY): Payer: Medicare HMO

## 2023-06-27 ENCOUNTER — Encounter (HOSPITAL_COMMUNITY): Payer: Self-pay | Admitting: Otolaryngology

## 2023-06-27 DIAGNOSIS — R061 Stridor: Secondary | ICD-10-CM | POA: Diagnosis not present

## 2023-06-27 DIAGNOSIS — E43 Unspecified severe protein-calorie malnutrition: Secondary | ICD-10-CM | POA: Diagnosis not present

## 2023-06-27 DIAGNOSIS — Z430 Encounter for attention to tracheostomy: Secondary | ICD-10-CM | POA: Diagnosis not present

## 2023-06-27 DIAGNOSIS — R0902 Hypoxemia: Secondary | ICD-10-CM | POA: Diagnosis not present

## 2023-06-27 LAB — BASIC METABOLIC PANEL
Anion gap: 9 (ref 5–15)
BUN: 18 mg/dL (ref 8–23)
CO2: 28 mmol/L (ref 22–32)
Calcium: 8.5 mg/dL — ABNORMAL LOW (ref 8.9–10.3)
Chloride: 100 mmol/L (ref 98–111)
Creatinine, Ser: 0.81 mg/dL (ref 0.61–1.24)
GFR, Estimated: >60 mL/min (ref >60)
Glucose, Bld: 103 mg/dL — ABNORMAL HIGH (ref 70–99)
Potassium: 4.3 mmol/L (ref 3.5–5.1)
Sodium: 137 mmol/L (ref 135–145)

## 2023-06-27 LAB — CBC
HCT: 32.6 % — ABNORMAL LOW (ref 39.0–52.0)
Hemoglobin: 10.4 g/dL — ABNORMAL LOW (ref 13.0–17.0)
MCH: 31 pg (ref 26.0–34.0)
MCHC: 31.9 g/dL (ref 30.0–36.0)
MCV: 97.3 fL (ref 80.0–100.0)
Platelets: 247 10*3/uL (ref 150–400)
RBC: 3.35 MIL/uL — ABNORMAL LOW (ref 4.22–5.81)
RDW: 12.9 % (ref 11.5–15.5)
WBC: 8.3 10*3/uL (ref 4.0–10.5)
nRBC: 0 % (ref 0.0–0.2)

## 2023-06-27 LAB — GLUCOSE, CAPILLARY
Glucose-Capillary: 100 mg/dL — ABNORMAL HIGH (ref 70–99)
Glucose-Capillary: 109 mg/dL — ABNORMAL HIGH (ref 70–99)
Glucose-Capillary: 114 mg/dL — ABNORMAL HIGH (ref 70–99)
Glucose-Capillary: 123 mg/dL — ABNORMAL HIGH (ref 70–99)
Glucose-Capillary: 131 mg/dL — ABNORMAL HIGH (ref 70–99)
Glucose-Capillary: 148 mg/dL — ABNORMAL HIGH (ref 70–99)
Glucose-Capillary: 89 mg/dL (ref 70–99)

## 2023-06-27 MED ORDER — IPRATROPIUM-ALBUTEROL 0.5-2.5 (3) MG/3ML IN SOLN
3.0000 mL | Freq: Three times a day (TID) | RESPIRATORY_TRACT | Status: DC
  Administered 2023-06-27 – 2023-06-28 (×3): 3 mL via RESPIRATORY_TRACT
  Filled 2023-06-27 (×3): qty 3

## 2023-06-27 MED ORDER — GUAIFENESIN 100 MG/5ML PO LIQD
15.0000 mL | Freq: Two times a day (BID) | ORAL | Status: DC
  Administered 2023-06-27 – 2023-07-06 (×17): 15 mL
  Filled 2023-06-27 (×17): qty 15

## 2023-06-27 MED ORDER — ACETYLCYSTEINE 20 % IN SOLN
4.0000 mL | Freq: Two times a day (BID) | RESPIRATORY_TRACT | Status: DC
  Administered 2023-06-28: 4 mL via RESPIRATORY_TRACT
  Filled 2023-06-27 (×2): qty 4

## 2023-06-27 MED ORDER — ACETYLCYSTEINE 10 % IN SOLN
4.0000 mL | Freq: Two times a day (BID) | RESPIRATORY_TRACT | Status: DC
  Administered 2023-06-27: 4 mL via RESPIRATORY_TRACT
  Filled 2023-06-27 (×2): qty 4

## 2023-06-27 MED ORDER — ACETYLCYSTEINE 10 % IN SOLN
4.0000 mL | Freq: Two times a day (BID) | RESPIRATORY_TRACT | Status: DC
  Filled 2023-06-27: qty 4

## 2023-06-27 MED ORDER — ALBUTEROL SULFATE (2.5 MG/3ML) 0.083% IN NEBU
2.5000 mg | INHALATION_SOLUTION | RESPIRATORY_TRACT | Status: DC | PRN
  Filled 2023-06-27: qty 3

## 2023-06-27 NOTE — Progress Notes (Signed)
Paged using secure chat at [phone] in regards to pt having very thick secretions and barely being able to suction him without the suction tubing getting clogged. Ask RT to come assess pt and her recommendations were a CXR and Mucomyst. Received call from Dr. Loney Loh at [facility] asking if pt is stable or has any increased WOB. Informed MD that the pt did not. MD handling emergencies. Will defer to AM team. Will page urgent if any changes.

## 2023-06-27 NOTE — Progress Notes (Signed)
PROGRESS NOTE        PATIENT DETAILS Name: Garrett Fleming Age: 67 y.o. Sex: male Date of Birth: July 10, 1956 Admit Date: 06/21/2023 Admitting Physician Lorin Glass, MD WUJ:[id number], [name], MD  Brief Summary: Patient is a 67 y.o.  male with known history of laryngeal cancer (recent PEG tube insertion-followed at [facility])-presented with respiratory distress-required emergent tracheostomy by ENT.  See below for further details.  Significant events: 9/22>> Admit with emergent Trach by ENT 9/24>> transferred to [facility] 9/27>> tracheostomy changed by ENT  Significant studies: 9/26>> CT chest: Possible aspiration pneumonia, pneumomediastinum/subcutaneous emphysema. 9/26>> CT neck: Obstructing mass in the supraglottic larynx with extension to the inferior aspect of the thyroid cartilage.  Significant microbiology data: 9/22>> blood culture: No growth 9/22>> influenza/COVID PCR/RSV PCR: Negative  Procedures: 9/22>> tracheotomy by ENT  Consults: ENT PCCM  Subjective: Vomiting overnight-tube feeds held-lying comfortably no complaints otherwise.  Objective: Vitals: Blood pressure 138/75, pulse 70, temperature 98.5 F (36.9 C), temperature source Oral, resp. rate 16, weight 58.2 kg, SpO2 98%.   Exam: Gen Exam:Alert awake-not in any distress HEENT:atraumatic, normocephalic Chest: B/L clear to auscultation anteriorly CVS:S1S2 regular Abdomen:soft non tender, non distended Extremities:no edema Neurology: Non focal Skin: no rash  Pertinent Labs/Radiology:    Latest Ref Rng & Units 06/27/2023    6:38 AM 06/24/2023    4:38 AM 06/23/2023    5:09 AM  CBC  WBC 4.0 - 10.5 K/uL 8.3  7.9  10.3   Hemoglobin 13.0 - 17.0 g/dL 78.2  95.6  21.3   Hematocrit 39.0 - 52.0 % 32.6  34.7  35.1   Platelets 150 - 400 K/uL 247  239  257     Lab Results  Component Value Date   NA 137 06/27/2023   K 4.3 06/27/2023   CL 100 06/27/2023   CO2 28 06/27/2023       Assessment/Plan: Acute hypoxic respiratory failure secondary to obstructing laryngeal mass-known history of laryngeal cancer S/p emergent tracheotomy by ENT on 9/22 Currently stable-on 6 L via trach collar Trach changed by ENT on 9/27  Per spouse-patient has an appointment with ENT at [facility] on 10/12.   Vomiting Vomiting overnight 9/27 No vomiting this morning Abdominal exam benign X-ray abdomen without SBO Restart tube feeds at a lower rate and increase slowly Follow and see how he does.  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  Aspiration pneumonia Stable-on room air-no leukocytosis Unasyn x 5 days completed 9/27  AKI Hemodynamically mediated Resolved  Normocytic anemia Secondary to underlying malignancy Hb stable  HTN BP stable Amlodipine  Social issues Essentially homeless-primary residence without electricity/water-spouse/patient was living in a hotel before he got hospitalized-currently he does not have the financial means to go back to the hotel until 10/3 when he gets his monthly check. Social worker following.  Nutrition Status: Nutrition Problem: Severe Malnutrition Etiology: chronic illness (laryngeal mass) Signs/Symptoms: severe fat depletion, severe muscle depletion Interventions: Refer to RD note for recommendations, Tube feeding  Underweight: Estimated body mass index is 15.62 kg/m as calculated from the following:   Height as of 05/04/23: 6\' 4"  (1.93 m).   Weight as of this encounter: 58.2 kg.   Code status:   Code Status: Full Code   DVT Prophylaxis: enoxaparin (LOVENOX) injection 40 mg Start: 06/22/23 1700 SCDs Start: 06/21/23  1845   Family Communication: Spouse at bedside   Disposition Plan: Status is: Inpatient Remains inpatient appropriate because: Severity of illness   Planned Discharge Destination:Home when appropriate by [facility] team-after trach change today.   Diet: Diet Order              Diet NPO time specified  Diet effective now                     Antimicrobial agents: Anti-infectives (From admission, onward)    Start     Dose/Rate Route Frequency Ordered Stop   06/21/23 2300  Ampicillin-Sulbactam (UNASYN) 3 g in sodium chloride 0.9 % 100 mL IVPB        3 g 200 mL/hr over 30 Minutes Intravenous Every 6 hours 06/21/23 1857 06/27/23 0659   06/21/23 1700  vancomycin (VANCOREADY) IVPB 1500 mg/300 mL  Status:  Discontinued        1,500 mg 150 mL/hr over 120 Minutes Intravenous  Once 06/21/23 1648 06/22/23 1552   06/21/23 1700  piperacillin-tazobactam (ZOSYN) IVPB 3.375 g        3.375 g 100 mL/hr over 30 Minutes Intravenous  Once 06/21/23 1648 06/21/23 1726        MEDICATIONS: Scheduled Meds:  amLODipine  5 mg Per Tube Daily   docusate  100 mg Per Tube BID   enoxaparin (LOVENOX) injection  40 mg Subcutaneous Q24H   feeding supplement (PROSource TF20)  60 mL Per Tube Daily   mouth rinse  15 mL Mouth Rinse 4 times per day   polyethylene glycol  17 g Per Tube Daily   Continuous Infusions:  feeding supplement (OSMOLITE 1.5 CAL) 60 mL/hr at 06/25/23 0534   PRN Meds:.acetaminophen, docusate sodium, fentaNYL (SUBLIMAZE) injection, fentaNYL (SUBLIMAZE) injection, hydrALAZINE, ondansetron (ZOFRAN) IV, mouth rinse, oxyCODONE, polyethylene glycol   I have personally reviewed following labs and imaging studies  LABORATORY DATA: CBC: Recent Labs  Lab 06/21/23 1640 06/22/23 0358 06/23/23 0509 06/24/23 0438 06/27/23 0638  WBC 12.2* 13.7* 10.3 7.9 8.3  NEUTROABS 10.3*  --   --  6.7  --   HGB 14.3 10.4* 11.4* 10.8* 10.4*  HCT 43.7 32.2* 35.1* 34.7* 32.6*  MCV 93.8 93.1 97.2 96.9 97.3  PLT 390 264 257 239 247    Basic Metabolic Panel: Recent Labs  Lab 06/21/23 1640 06/22/23 0358 06/22/23 2002 06/23/23 0509 06/23/23 1711 06/24/23 0438 06/27/23 0638  NA 140 137  --  139  --  140 137  K 4.1 4.3  --  3.7  --  3.6 4.3  CL 98 99  --  102   --  103 100  CO2 25 29  --  26  --  28 28  GLUCOSE 140* 202*  --  111*  --  140* 103*  BUN 45* 42*  --  36*  --  23 18  CREATININE 1.43* 1.36*  --  1.14  --  0.82 0.81  CALCIUM 9.4 8.4*  --  8.4*  --  8.4* 8.5*  MG  --  2.5* 2.4 2.3 2.2 2.1  --   PHOS  --  4.8* 3.7 2.5 2.8 2.7  --     GFR: Estimated Creatinine Clearance: 74.8 mL/min (by C-G formula based on SCr of 0.81 mg/dL).  Liver Function Tests: Recent Labs  Lab 06/21/23 1640 06/23/23 0509  AST 23 20  ALT 32 26  ALKPHOS 97 71  BILITOT 1.5* 1.0  PROT 7.5 6.1*  ALBUMIN 3.7 2.9*  No results for input(s): "LIPASE", "AMYLASE" in the last 168 hours. No results for input(s): "AMMONIA" in the last 168 hours.  Coagulation Profile: No results for input(s): "INR", "PROTIME" in the last 168 hours.  Cardiac Enzymes: No results for input(s): "CKTOTAL", "CKMB", "CKMBINDEX", "TROPONINI" in the last 168 hours.  BNP (last 3 results) No results for input(s): "PROBNP" in the last 8760 hours.  Lipid Profile: No results for input(s): "CHOL", "HDL", "LDLCALC", "TRIG", "CHOLHDL", "LDLDIRECT" in the last 72 hours.  Thyroid Function Tests: No results for input(s): "TSH", "T4TOTAL", "FREET4", "T3FREE", "THYROIDAB" in the last 72 hours.  Anemia Panel: No results for input(s): "VITAMINB12", "FOLATE", "FERRITIN", "TIBC", "IRON", "RETICCTPCT" in the last 72 hours.  Urine analysis:    Component Value Date/Time   COLORURINE YELLOW 11/17/2018 0144   APPEARANCEUR CLEAR 11/17/2018 0144   LABSPEC >1.046 (H) 11/17/2018 0144   PHURINE 6.0 11/17/2018 0144   GLUCOSEU NEGATIVE 11/17/2018 0144   HGBUR NEGATIVE 11/17/2018 0144   BILIRUBINUR NEGATIVE 11/17/2018 0144   KETONESUR NEGATIVE 11/17/2018 0144   PROTEINUR NEGATIVE 11/17/2018 0144   NITRITE NEGATIVE 11/17/2018 0144   LEUKOCYTESUR NEGATIVE 11/17/2018 0144    Sepsis Labs: Lactic Acid, Venous    Component Value Date/Time   LATICACIDVEN 2.0 (HH) 06/21/2023 1655     MICROBIOLOGY: Recent Results (from the past 240 hour(s))  Resp panel by RT-PCR (RSV, Flu A&B, Covid) Anterior Nasal Swab     Status: None   Collection Time: 06/21/23  4:32 PM   Specimen: Anterior Nasal Swab  Result Value Ref Range Status   SARS Coronavirus 2 by RT PCR NEGATIVE NEGATIVE Final   Influenza A by PCR NEGATIVE NEGATIVE Final   Influenza B by PCR NEGATIVE NEGATIVE Final    Comment: (NOTE) The Xpert Xpress SARS-CoV-2/FLU/RSV plus assay is intended as an aid in the diagnosis of influenza from Nasopharyngeal swab specimens and should not be used as a sole basis for treatment. Nasal washings and aspirates are unacceptable for Xpert Xpress SARS-CoV-2/FLU/RSV testing.  Fact Sheet for Patients: BloggerCourse.com  Fact Sheet for Healthcare Providers: SeriousBroker.it  This test is not yet approved or cleared by the [place] FDA and has been authorized for detection and/or diagnosis of SARS-CoV-2 by FDA under an Emergency Use Authorization (EUA). This EUA will remain in effect (meaning this test can be used) for the duration of the COVID-19 declaration under Section 564(b)(1) of the Act, 21 U.S.C. section 360bbb-3(b)(1), unless the authorization is terminated or revoked.     Resp Syncytial Virus by PCR NEGATIVE NEGATIVE Final    Comment: (NOTE) Fact Sheet for Patients: BloggerCourse.com  Fact Sheet for Healthcare Providers: SeriousBroker.it  This test is not yet approved or cleared by the [place] FDA and has been authorized for detection and/or diagnosis of SARS-CoV-2 by FDA under an Emergency Use Authorization (EUA). This EUA will remain in effect (meaning this test can be used) for the duration of the COVID-19 declaration under Section 564(b)(1) of the Act, 21 U.S.C. section 360bbb-3(b)(1), unless the authorization is terminated or revoked.  Performed at  [facility], [address], [place], [place] [address]   Blood culture (routine x 2)     Status: None   Collection Time: 06/21/23  4:40 PM   Specimen: BLOOD  Result Value Ref Range Status   Specimen Description BLOOD SITE NOT SPECIFIED  Final   Special Requests   Final    BOTTLES DRAWN AEROBIC AND ANAEROBIC Blood Culture results may not be optimal due  to an excessive volume of blood received in culture bottles   Culture   Final    NO GROWTH 5 DAYS Performed at [facility], [address], [place], [place] [address]    Report Status 06/26/2023 FINAL  Final  Blood culture (routine x 2)     Status: None   Collection Time: 06/21/23  4:55 PM   Specimen: BLOOD  Result Value Ref Range Status   Specimen Description BLOOD SITE NOT SPECIFIED  Final   Special Requests   Final    BOTTLES DRAWN AEROBIC AND ANAEROBIC Blood Culture adequate volume   Culture   Final    NO GROWTH 5 DAYS Performed at [facility], [address] [place], [place] [address]    Report Status 06/26/2023 FINAL  Final  MRSA Next Gen by PCR, Nasal     Status: None   Collection Time: 06/21/23  8:20 PM   Specimen: Nasal Mucosa; Nasal Swab  Result Value Ref Range Status   MRSA by PCR Next Gen NOT DETECTED NOT DETECTED Final    Comment: (NOTE) The GeneXpert MRSA Assay (FDA approved for NASAL specimens only), is one component of a comprehensive MRSA colonization surveillance program. It is not intended to diagnose MRSA infection nor to guide or monitor treatment for MRSA infections. Test performance is not FDA approved in patients less than 1 years old. Performed at [facility], [address] [place], [place] [address]     RADIOLOGY STUDIES/RESULTS: DG Abd 2 Views  Result Date: 06/27/2023 CLINICAL DATA:  Vomiting EXAM: ABDOMEN - 2 VIEW COMPARISON:  06/25/2023 CT chest FINDINGS: Lung bases clear. Gastrostomy tube in expected location. No definite free air although the diaphragmatic  leaflets are incompletely included on the erect radiograph. Scattered gas filled loops of small bowel and colon without overt dilatation. Mild lumbar dextroscoliosis with multilevel spondylitic change. IMPRESSION: Nonobstructive bowel gas pattern. Electronically Signed   By: Corlis Leak M.D.   On: 06/27/2023 10:09   CT SOFT TISSUE NECK WO CONTRAST  Result Date: 06/25/2023 CLINICAL DATA:  Neck mass, nonpulsatile.  Shortness of breath. EXAM: CT NECK WITHOUT CONTRAST TECHNIQUE: Multidetector CT imaging of the neck was performed following the standard protocol without intravenous contrast. RADIATION DOSE REDUCTION: This exam was performed according to the departmental dose-optimization program which includes automated exposure control, adjustment of the mA and/or kV according to patient size and/or use of iterative reconstruction technique. COMPARISON:  Neck CT 05/04/2023. FINDINGS: Pharynx and larynx: Unchanged obstructing mass in the supraglottic larynx with extension through the inferior aspect of the thyroid cartilage into the pre visceral space, better evaluated on recent contrast-enhanced neck CT. Midline tracheostomy in good position. Salivary glands: Subcutaneous emphysema tracking along the right submandibular gland. Thyroid: Normal. Lymph nodes: Better evaluated on recent contrast-enhanced study. Vascular: Atherosclerotic calcifications of the carotid bulbs. Limited intracranial: Unremarkable. Visualized orbits: Right phthisis bulbi. Mastoids and visualized paranasal sinuses: Well aerated. Skeleton: Extensive dental caries. Mild cervical spondylosis without high-grade spinal canal stenosis. Upper chest: Pneumomediastinum and subcutaneous emphysema tracking along the right-greater-than-left chest wall. Please refer to same-day chest CT report. Other: Subcutaneous emphysema tracks along the superficial and deep fascial planes of the right-greater-than-left neck to the skull base. IMPRESSION: 1. Unchanged  obstructing mass in the supraglottic larynx with extension through the inferior aspect of the thyroid cartilage into the pre visceral space, better evaluated on recent contrast-enhanced neck CT. 2. Midline tracheostomy in good position. 3. Pneumomediastinum and subcutaneous emphysema tracking  along the right-greater-than-left chest wall to the skull base. Electronically Signed   By: Orvan Falconer M.D.   On: 06/25/2023 16:39   CT CHEST WO CONTRAST  Result Date: 06/25/2023 CLINICAL DATA:  "Respiratory illness".  Shortness of breath. EXAM: CT CHEST WITHOUT CONTRAST TECHNIQUE: Multidetector CT imaging of the chest was performed following the standard protocol without IV contrast. RADIATION DOSE REDUCTION: This exam was performed according to the departmental dose-optimization program which includes automated exposure control, adjustment of the mA and/or kV according to patient size and/or use of iterative reconstruction technique. COMPARISON:  Plain film of earlier today.  Chest CT 05/04/2023. 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 Previously described descending thoracic aortic dissection can not be evaluated on this noncontrast exam. The ascending aorta is upper normal at 3.9 cm on 93/3. Mild cardiomegaly, without pericardial effusion. Lad and left circumflex coronary artery calcification. Mediastinum/Nodes: Extensive pneumomediastinum and bilateral subcutaneous emphysema about the axillas and right supraclavicular region. No mediastinal or hilar adenopathy, given limitations of unenhanced CT. Lungs/Pleura: Trace bilateral pleural fluid.  No pneumothorax. Tracheostomy appropriately positioned, well above the carina. Fluid within the dependent trachea and both mainstem bronchi. Development of mild areas of peribronchovascular right middle and right lower lobe airspace and ground-glass opacity. Upper Abdomen: Artifact degradation from patient arm  position. Left hepatic lobe cyst. Musculoskeletal: No acute osseous abnormality. IMPRESSION: 1. Right middle and right lower lobe peribronchovascular airspace and ground-glass opacities, favoring infection or aspiration. Fluid in the endobronchial tree for which aspiration should be clinically excluded. 2. Pneumomediastinum and subcutaneous emphysema, as on multiple prior chest radiographs. 3. Trace bilateral pleural fluid 4.  Aortic Atherosclerosis (ICD10-I70.0). Electronically Signed   By: Jeronimo Greaves M.D.   On: 06/25/2023 16:32     LOS: 6 days   Jeoffrey Massed, MD  [facility]    To contact the attending provider between 7A-7P or the covering provider during after hours 7P-7A, please log into the web site [email or web address] and access using universal [place] password for that web site. If you do not have the password, please call the hospital operator.  06/27/2023, 10:51 AM

## 2023-06-27 NOTE — Plan of Care (Signed)
  Problem: Education: Goal: Knowledge about tracheostomy care/management will improve Outcome: Progressing   Problem: Activity: Goal: Ability to tolerate increased activity will improve Outcome: Progressing   Problem: Health Behavior/Discharge Planning: Goal: Ability to manage tracheostomy will improve Outcome: Progressing   Problem: Respiratory: Goal: Patent airway maintenance will improve Outcome: Progressing   Problem: Role Relationship: Goal: Ability to communicate will improve Outcome: Progressing   Problem: Education: Goal: Knowledge of General Education information will improve Description: Including pain rating scale, medication(s)/side effects and non-pharmacologic comfort measures Outcome: Progressing   Problem: Health Behavior/Discharge Planning: Goal: Ability to manage health-related needs will improve Outcome: Progressing   Problem: Clinical Measurements: Goal: Ability to maintain clinical measurements within normal limits will improve Outcome: Progressing Goal: Will remain free from infection Outcome: Progressing Goal: Diagnostic test results will improve Outcome: Progressing Goal: Respiratory complications will improve Outcome: Progressing Goal: Cardiovascular complication will be avoided Outcome: Progressing   Problem: Activity: Goal: Risk for activity intolerance will decrease Outcome: Progressing   Problem: Nutrition: Goal: Adequate nutrition will be maintained Outcome: Progressing   Problem: Coping: Goal: Level of anxiety will decrease Outcome: Progressing   Problem: Elimination: Goal: Will not experience complications related to bowel motility Outcome: Progressing Goal: Will not experience complications related to urinary retention Outcome: Progressing   Problem: Pain Managment: Goal: General experience of comfort will improve Outcome: Progressing   Problem: Safety: Goal: Ability to remain free from injury will improve Outcome:  Progressing   Problem: Skin Integrity: Goal: Risk for impaired skin integrity will decrease Outcome: Progressing   

## 2023-06-28 DIAGNOSIS — E43 Unspecified severe protein-calorie malnutrition: Secondary | ICD-10-CM | POA: Diagnosis not present

## 2023-06-28 DIAGNOSIS — Z430 Encounter for attention to tracheostomy: Secondary | ICD-10-CM | POA: Diagnosis not present

## 2023-06-28 DIAGNOSIS — R0902 Hypoxemia: Secondary | ICD-10-CM | POA: Diagnosis not present

## 2023-06-28 DIAGNOSIS — R061 Stridor: Secondary | ICD-10-CM | POA: Diagnosis not present

## 2023-06-28 LAB — CBC
HCT: 29.6 % — ABNORMAL LOW (ref 39.0–52.0)
Hemoglobin: 9.4 g/dL — ABNORMAL LOW (ref 13.0–17.0)
MCH: 30.2 pg (ref 26.0–34.0)
MCHC: 31.8 g/dL (ref 30.0–36.0)
MCV: 95.2 fL (ref 80.0–100.0)
Platelets: 265 10*3/uL (ref 150–400)
RBC: 3.11 MIL/uL — ABNORMAL LOW (ref 4.22–5.81)
RDW: 13 % (ref 11.5–15.5)
WBC: 6.2 10*3/uL (ref 4.0–10.5)
nRBC: 0 % (ref 0.0–0.2)

## 2023-06-28 LAB — BASIC METABOLIC PANEL
Anion gap: 16 — ABNORMAL HIGH (ref 5–15)
BUN: 25 mg/dL — ABNORMAL HIGH (ref 8–23)
CO2: 25 mmol/L (ref 22–32)
Calcium: 8.8 mg/dL — ABNORMAL LOW (ref 8.9–10.3)
Chloride: 97 mmol/L — ABNORMAL LOW (ref 98–111)
Creatinine, Ser: 0.81 mg/dL (ref 0.61–1.24)
GFR, Estimated: >60 mL/min (ref >60)
Glucose, Bld: 97 mg/dL (ref 70–99)
Potassium: 4.2 mmol/L (ref 3.5–5.1)
Sodium: 138 mmol/L (ref 135–145)

## 2023-06-28 LAB — GLUCOSE, CAPILLARY
Glucose-Capillary: 117 mg/dL — ABNORMAL HIGH (ref 70–99)
Glucose-Capillary: 79 mg/dL (ref 70–99)
Glucose-Capillary: 96 mg/dL (ref 70–99)
Glucose-Capillary: 92 mg/dL (ref 70–99)
Glucose-Capillary: 97 mg/dL (ref 70–99)

## 2023-06-28 LAB — LIPASE, BLOOD: Lipase: 28 U/L (ref 11–51)

## 2023-06-28 MED ORDER — IPRATROPIUM-ALBUTEROL 0.5-2.5 (3) MG/3ML IN SOLN
3.0000 mL | Freq: Two times a day (BID) | RESPIRATORY_TRACT | Status: DC
  Administered 2023-06-28 – 2023-06-29 (×2): 3 mL via RESPIRATORY_TRACT
  Filled 2023-06-28 (×2): qty 3

## 2023-06-28 MED ORDER — OXYCODONE HCL 5 MG PO TABS
5.0000 mg | ORAL_TABLET | ORAL | Status: DC | PRN
  Administered 2023-06-28 – 2023-07-05 (×7): 5 mg
  Filled 2023-06-28 (×7): qty 1

## 2023-06-28 MED ORDER — ACETYLCYSTEINE 20 % IN SOLN
4.0000 mL | Freq: Two times a day (BID) | RESPIRATORY_TRACT | Status: DC
  Administered 2023-06-29 – 2023-07-03 (×10): 4 mL via RESPIRATORY_TRACT
  Filled 2023-06-28 (×12): qty 4

## 2023-06-28 MED ORDER — SODIUM CHLORIDE 0.9 % IV SOLN
12.5000 mg | Freq: Once | INTRAVENOUS | Status: AC | PRN
  Administered 2023-06-28: 12.5 mg via INTRAVENOUS
  Filled 2023-06-28: qty 0.5

## 2023-06-28 MED ORDER — SODIUM CHLORIDE 0.9 % IV SOLN: INTRAVENOUS | Status: AC

## 2023-06-28 NOTE — Plan of Care (Signed)
  Problem: Education: Goal: Knowledge about tracheostomy care/management will improve Outcome: Progressing   Problem: Activity: Goal: Ability to tolerate increased activity will improve Outcome: Progressing   Problem: Health Behavior/Discharge Planning: Goal: Ability to manage tracheostomy will improve Outcome: Progressing   Problem: Respiratory: Goal: Patent airway maintenance will improve Outcome: Progressing   Problem: Role Relationship: Goal: Ability to communicate will improve Outcome: Progressing   Problem: Education: Goal: Knowledge of General Education information will improve Description: Including pain rating scale, medication(s)/side effects and non-pharmacologic comfort measures Outcome: Progressing   Problem: Health Behavior/Discharge Planning: Goal: Ability to manage health-related needs will improve Outcome: Progressing   Problem: Clinical Measurements: Goal: Ability to maintain clinical measurements within normal limits will improve Outcome: Progressing Goal: Will remain free from infection Outcome: Progressing Goal: Diagnostic test results will improve Outcome: Progressing Goal: Respiratory complications will improve Outcome: Progressing Goal: Cardiovascular complication will be avoided Outcome: Progressing   Problem: Activity: Goal: Risk for activity intolerance will decrease Outcome: Progressing   Problem: Nutrition: Goal: Adequate nutrition will be maintained Outcome: Progressing   Problem: Coping: Goal: Level of anxiety will decrease Outcome: Progressing   Problem: Elimination: Goal: Will not experience complications related to bowel motility Outcome: Progressing Goal: Will not experience complications related to urinary retention Outcome: Progressing   Problem: Pain Managment: Goal: General experience of comfort will improve Outcome: Progressing   Problem: Safety: Goal: Ability to remain free from injury will improve Outcome:  Progressing   Problem: Skin Integrity: Goal: Risk for impaired skin integrity will decrease Outcome: Progressing   

## 2023-06-28 NOTE — Progress Notes (Signed)
PROGRESS NOTE        PATIENT DETAILS Name: Garrett Fleming Age: 67 y.o. Sex: male Date of Birth: [birth year] Admit Date: 06/21/2023 Admitting Physician Lorin Glass, MD Garrett Fleming, Garrett Bryant, MD  Brief Summary: Patient is a 67 y.o.  male with known history of laryngeal cancer (recent PEG tube insertion-followed at [facility])-presented with respiratory distress-required emergent tracheostomy by ENT.  See below for further details.  Significant events: 9/22>> Admit with emergent Trach by ENT 9/24>> transferred to [facility] 9/27>> tracheostomy changed by ENT  Significant studies: 9/26>> CT chest: Possible aspiration pneumonia, pneumomediastinum/subcutaneous emphysema. 9/26>> CT neck: Obstructing mass in the supraglottic larynx with extension to the inferior aspect of the thyroid cartilage.  Significant microbiology data: 9/22>> blood culture: No growth 9/22>> influenza/COVID PCR/RSV PCR: Negative  Procedures: 9/22>> tracheotomy by ENT  Consults: ENT PCCM  Subjective: Sleeping comfortably-more vomiting spells overnight.  Last BM was yesterday.  Denies any abdominal pain.  Objective: Vitals: Blood pressure 137/85, pulse (!) 166, temperature 98.9 F (37.2 C), temperature source Oral, resp. rate 15, weight 58.2 kg, SpO2 97%.   Exam: Gen Exam:Alert awake-not in any distress HEENT:atraumatic, normocephalic Chest: B/L clear to auscultation anteriorly CVS:S1S2 regular Abdomen:soft non tender, non distended Extremities:no edema Neurology: Non focal Skin: no rash  Pertinent Labs/Radiology:    Latest Ref Rng & Units 06/27/2023    6:38 AM 06/24/2023    4:38 AM 06/23/2023    5:09 AM  CBC  WBC 4.0 - 10.5 K/uL 8.3  7.9  10.3   Hemoglobin 13.0 - 17.0 g/dL 34.7  42.5  95.6   Hematocrit 39.0 - 52.0 % 32.6  34.7  35.1   Platelets 150 - 400 K/uL 247  239  257     Lab Results  Component Value Date   NA 137 06/27/2023   K 4.3 06/27/2023   CL 100  06/27/2023   CO2 28 06/27/2023      Assessment/Plan: Acute hypoxic respiratory failure secondary to obstructing laryngeal mass-known history of laryngeal cancer S/p emergent tracheotomy by ENT on 9/22 Currently stable-on 6 L via trach collar Trach changed by ENT on 9/27  Per spouse-patient has an appointment with ENT at [facility] on 10/12.   Vomiting Continued vomiting overnight X-ray abdomen on 9/28 without any SBO Since vomiting persist-proceeding with CT abdomen/pelvis Hold tube feeds for now Gentle IV fluid hydration Check lipase-unfortunately labs still pending.   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  Aspiration pneumonia Stable-on room air-no leukocytosis Unasyn x 5 days completed 9/27 Per nursing staff-bringing up thick phlegm-continue nebs/incentive spirometry/mobilization.  AKI Hemodynamically mediated Resolved  Normocytic anemia Secondary to underlying malignancy Hb stable  HTN BP stable Amlodipine  Social issues Essentially homeless-primary residence without electricity/water-spouse/patient was living in a hotel before he got hospitalized-currently he does not have the financial means to go back to the hotel until 10/3 when he gets his monthly check. Social worker following.  Nutrition Status: Nutrition Problem: Severe Malnutrition Etiology: chronic illness (laryngeal mass) Signs/Symptoms: severe fat depletion, severe muscle depletion Interventions: Refer to RD note for recommendations, Tube feeding  Underweight: Estimated body mass index is 15.62 kg/m as calculated from the following:   Height as of 05/04/23: 6\' 4"  (1.93 m).   Weight as of this encounter: 58.2 kg.   Code status:   Code Status:  Full Code   DVT Prophylaxis: enoxaparin (LOVENOX) injection 40 mg Start: 06/22/23 1700 SCDs Start: 06/21/23 1845   Family Communication: Spouse at bedside   Disposition Plan: Status is: Inpatient Remains  inpatient appropriate because: Severity of illness   Planned Discharge Destination:Home when appropriate by [facility] team-after trach change today.   Diet: Diet Order             Diet NPO time specified  Diet effective now                     Antimicrobial agents: Anti-infectives (From admission, onward)    Start     Dose/Rate Route Frequency Ordered Stop   06/21/23 2300  Ampicillin-Sulbactam (UNASYN) 3 g in sodium chloride 0.9 % 100 mL IVPB        3 g 200 mL/hr over 30 Minutes Intravenous Every 6 hours 06/21/23 1857 06/27/23 0659   06/21/23 1700  vancomycin (VANCOREADY) IVPB 1500 mg/300 mL  Status:  Discontinued        1,500 mg 150 mL/hr over 120 Minutes Intravenous  Once 06/21/23 1648 06/22/23 1552   06/21/23 1700  piperacillin-tazobactam (ZOSYN) IVPB 3.375 g        3.375 g 100 mL/hr over 30 Minutes Intravenous  Once 06/21/23 1648 06/21/23 1726        MEDICATIONS: Scheduled Meds:  acetylcysteine  4 mL Nebulization BID   amLODipine  5 mg Per Tube Daily   enoxaparin (LOVENOX) injection  40 mg Subcutaneous Q24H   feeding supplement (PROSource TF20)  60 mL Per Tube Daily   guaiFENesin  15 mL Per Tube BID   ipratropium-albuterol  3 mL Nebulization BID   mouth rinse  15 mL Mouth Rinse 4 times per day   polyethylene glycol  17 g Per Tube Daily   Continuous Infusions:  sodium chloride     feeding supplement (OSMOLITE 1.5 CAL) Stopped (06/27/23 2333)   PRN Meds:.acetaminophen, albuterol, docusate sodium, hydrALAZINE, ondansetron (ZOFRAN) IV, mouth rinse, oxyCODONE, polyethylene glycol   I have personally reviewed following labs and imaging studies  LABORATORY DATA: CBC: Recent Labs  Lab 06/21/23 1640 06/22/23 0358 06/23/23 0509 06/24/23 0438 06/27/23 0638  WBC 12.2* 13.7* 10.3 7.9 8.3  NEUTROABS 10.3*  --   --  6.7  --   HGB 14.3 10.4* 11.4* 10.8* 10.4*  HCT 43.7 32.2* 35.1* 34.7* 32.6*  MCV 93.8 93.1 97.2 96.9 97.3  PLT 390 264 257 239 247    Basic  Metabolic Panel: Recent Labs  Lab 06/21/23 1640 06/22/23 0358 06/22/23 2002 06/23/23 0509 06/23/23 1711 06/24/23 0438 06/27/23 0638  NA 140 137  --  139  --  140 137  K 4.1 4.3  --  3.7  --  3.6 4.3  CL 98 99  --  102  --  103 100  CO2 25 29  --  26  --  28 28  GLUCOSE 140* 202*  --  111*  --  140* 103*  BUN 45* 42*  --  36*  --  23 18  CREATININE 1.43* 1.36*  --  1.14  --  0.82 0.81  CALCIUM 9.4 8.4*  --  8.4*  --  8.4* 8.5*  MG  --  2.5* 2.4 2.3 2.2 2.1  --   PHOS  --  4.8* 3.7 2.5 2.8 2.7  --     GFR: Estimated Creatinine Clearance: 74.8 mL/min (by C-G formula based on SCr of 0.81 mg/dL).  Liver Function Tests:  Recent Labs  Lab 06/21/23 1640 06/23/23 0509  AST 23 20  ALT 32 26  ALKPHOS 97 71  BILITOT 1.5* 1.0  PROT 7.5 6.1*  ALBUMIN 3.7 2.9*   No results for input(s): "LIPASE", "AMYLASE" in the last 168 hours. No results for input(s): "AMMONIA" in the last 168 hours.  Coagulation Profile: No results for input(s): "INR", "PROTIME" in the last 168 hours.  Cardiac Enzymes: No results for input(s): "CKTOTAL", "CKMB", "CKMBINDEX", "TROPONINI" in the last 168 hours.  BNP (last 3 results) No results for input(s): "PROBNP" in the last 8760 hours.  Lipid Profile: No results for input(s): "CHOL", "HDL", "LDLCALC", "TRIG", "CHOLHDL", "LDLDIRECT" in the last 72 hours.  Thyroid Function Tests: No results for input(s): "TSH", "T4TOTAL", "FREET4", "T3FREE", "THYROIDAB" in the last 72 hours.  Anemia Panel: No results for input(s): "VITAMINB12", "FOLATE", "FERRITIN", "TIBC", "IRON", "RETICCTPCT" in the last 72 hours.  Urine analysis:    Component Value Date/Time   COLORURINE YELLOW 11/17/2018 0144   APPEARANCEUR CLEAR 11/17/2018 0144   LABSPEC >1.046 (H) 11/17/2018 0144   PHURINE 6.0 11/17/2018 0144   GLUCOSEU NEGATIVE 11/17/2018 0144   HGBUR NEGATIVE 11/17/2018 0144   BILIRUBINUR NEGATIVE 11/17/2018 0144   KETONESUR NEGATIVE 11/17/2018 0144   PROTEINUR NEGATIVE  11/17/2018 0144   NITRITE NEGATIVE 11/17/2018 0144   LEUKOCYTESUR NEGATIVE 11/17/2018 0144    Sepsis Labs: Lactic Acid, Venous    Component Value Date/Time   LATICACIDVEN 2.0 (HH) 06/21/2023 1655    MICROBIOLOGY: Recent Results (from the past 240 hour(s))  Resp panel by RT-PCR (RSV, Flu A&B, Covid) Anterior Nasal Swab     Status: None   Collection Time: 06/21/23  4:32 PM   Specimen: Anterior Nasal Swab  Result Value Ref Range Status   SARS Coronavirus 2 by RT PCR NEGATIVE NEGATIVE Final   Influenza A by PCR NEGATIVE NEGATIVE Final   Influenza B by PCR NEGATIVE NEGATIVE Final    Comment: (NOTE) The Xpert Xpress SARS-CoV-2/FLU/RSV plus assay is intended as an aid in the diagnosis of influenza from Nasopharyngeal swab specimens and should not be used as a sole basis for treatment. Nasal washings and aspirates are unacceptable for Xpert Xpress SARS-CoV-2/FLU/RSV testing.  Fact Sheet for Patients: BloggerCourse.com  Fact Sheet for Healthcare Providers: SeriousBroker.it  This test is not yet approved or cleared by the [place] FDA and has been authorized for detection and/or diagnosis of SARS-CoV-2 by FDA under an Emergency Use Authorization (EUA). This EUA will remain in effect (meaning this test can be used) for the duration of the COVID-19 declaration under Section 564(b)(1) of the Act, 21 U.S.C. section 360bbb-3(b)(1), unless the authorization is terminated or revoked.     Resp Syncytial Virus by PCR NEGATIVE NEGATIVE Final    Comment: (NOTE) Fact Sheet for Patients: BloggerCourse.com  Fact Sheet for Healthcare Providers: SeriousBroker.it  This test is not yet approved or cleared by the [place] FDA and has been authorized for detection and/or diagnosis of SARS-CoV-2 by FDA under an Emergency Use Authorization (EUA). This EUA will remain in effect (meaning  this test can be used) for the duration of the COVID-19 declaration under Section 564(b)(1) of the Act, 21 U.S.C. section 360bbb-3(b)(1), unless the authorization is terminated or revoked.  Performed at [facility], [address], [place], [place] [address]   Blood culture (routine x 2)     Status: None   Collection Time: 06/21/23  4:40 PM   Specimen: BLOOD  Result Value Ref Range  Status   Specimen Description BLOOD SITE NOT SPECIFIED  Final   Special Requests   Final    BOTTLES DRAWN AEROBIC AND ANAEROBIC Blood Culture results may not be optimal due to an excessive volume of blood received in culture bottles   Culture   Final    NO GROWTH 5 DAYS Performed at [facility], [address] [place], [place] [address]    Report Status 06/26/2023 FINAL  Final  Blood culture (routine x 2)     Status: None   Collection Time: 06/21/23  4:55 PM   Specimen: BLOOD  Result Value Ref Range Status   Specimen Description BLOOD SITE NOT SPECIFIED  Final   Special Requests   Final    BOTTLES DRAWN AEROBIC AND ANAEROBIC Blood Culture adequate volume   Culture   Final    NO GROWTH 5 DAYS Performed at [facility], [address], [place], [place] [address]    Report Status 06/26/2023 FINAL  Final  MRSA Next Gen by PCR, Nasal     Status: None   Collection Time: 06/21/23  8:20 PM   Specimen: Nasal Mucosa; Nasal Swab  Result Value Ref Range Status   MRSA by PCR Next Gen NOT DETECTED NOT DETECTED Final    Comment: (NOTE) The GeneXpert MRSA Assay (FDA approved for NASAL specimens only), is one component of a comprehensive MRSA colonization surveillance program. It is not intended to diagnose MRSA infection nor to guide or monitor treatment for MRSA infections. Test performance is not FDA approved in patients less than 63 years old. Performed at [facility], [address], [place], [place] [address]     RADIOLOGY STUDIES/RESULTS: DG Abd 2 Views  Result Date:  06/27/2023 CLINICAL DATA:  Vomiting EXAM: ABDOMEN - 2 VIEW COMPARISON:  06/25/2023 CT chest FINDINGS: Lung bases clear. Gastrostomy tube in expected location. No definite free air although the diaphragmatic leaflets are incompletely included on the erect radiograph. Scattered gas filled loops of small bowel and colon without overt dilatation. Mild lumbar dextroscoliosis with multilevel spondylitic change. IMPRESSION: Nonobstructive bowel gas pattern. Electronically Signed   By: Corlis Leak M.D.   On: 06/27/2023 10:09     LOS: 7 days   Jeoffrey Massed, MD  [facility]    To contact the attending provider between 7A-7P or the covering provider during after hours 7P-7A, please log into the web site [email or web address] and access using universal [place] password for that web site. If you do not have the password, please call the hospital operator.  06/28/2023, 12:16 PM

## 2023-06-29 ENCOUNTER — Inpatient Hospital Stay (HOSPITAL_COMMUNITY): Payer: Medicare HMO

## 2023-06-29 DIAGNOSIS — R0902 Hypoxemia: Secondary | ICD-10-CM | POA: Diagnosis not present

## 2023-06-29 DIAGNOSIS — Z430 Encounter for attention to tracheostomy: Secondary | ICD-10-CM | POA: Diagnosis not present

## 2023-06-29 DIAGNOSIS — R061 Stridor: Secondary | ICD-10-CM | POA: Diagnosis not present

## 2023-06-29 DIAGNOSIS — E43 Unspecified severe protein-calorie malnutrition: Secondary | ICD-10-CM | POA: Diagnosis not present

## 2023-06-29 LAB — BASIC METABOLIC PANEL
Anion gap: 13 (ref 5–15)
BUN: 24 mg/dL — ABNORMAL HIGH (ref 8–23)
CO2: 28 mmol/L (ref 22–32)
Calcium: 8.8 mg/dL — ABNORMAL LOW (ref 8.9–10.3)
Chloride: 97 mmol/L — ABNORMAL LOW (ref 98–111)
Creatinine, Ser: 0.86 mg/dL (ref 0.61–1.24)
GFR, Estimated: >60 mL/min (ref >60)
Glucose, Bld: 111 mg/dL — ABNORMAL HIGH (ref 70–99)
Potassium: 3.6 mmol/L (ref 3.5–5.1)
Sodium: 138 mmol/L (ref 135–145)

## 2023-06-29 LAB — CBC
HCT: 31.8 % — ABNORMAL LOW (ref 39.0–52.0)
Hemoglobin: 10.2 g/dL — ABNORMAL LOW (ref 13.0–17.0)
MCH: 30 pg (ref 26.0–34.0)
MCHC: 32.1 g/dL (ref 30.0–36.0)
MCV: 93.5 fL (ref 80.0–100.0)
Platelets: 283 10*3/uL (ref 150–400)
RBC: 3.4 MIL/uL — ABNORMAL LOW (ref 4.22–5.81)
RDW: 12.9 % (ref 11.5–15.5)
WBC: 5.6 10*3/uL (ref 4.0–10.5)
nRBC: 0 % (ref 0.0–0.2)

## 2023-06-29 LAB — GLUCOSE, CAPILLARY
Glucose-Capillary: 101 mg/dL — ABNORMAL HIGH (ref 70–99)
Glucose-Capillary: 107 mg/dL — ABNORMAL HIGH (ref 70–99)
Glucose-Capillary: 112 mg/dL — ABNORMAL HIGH (ref 70–99)
Glucose-Capillary: 112 mg/dL — ABNORMAL HIGH (ref 70–99)
Glucose-Capillary: 121 mg/dL — ABNORMAL HIGH (ref 70–99)
Glucose-Capillary: 97 mg/dL (ref 70–99)

## 2023-06-29 MED ORDER — IOHEXOL 350 MG/ML SOLN
75.0000 mL | Freq: Once | INTRAVENOUS | Status: AC | PRN
  Administered 2023-06-29: 75 mL via INTRAVENOUS

## 2023-06-29 MED ORDER — SODIUM CHLORIDE 3 % IN NEBU
4.0000 mL | INHALATION_SOLUTION | Freq: Two times a day (BID) | RESPIRATORY_TRACT | Status: AC
  Administered 2023-06-29 – 2023-07-01 (×6): 4 mL via RESPIRATORY_TRACT
  Filled 2023-06-29 (×6): qty 4

## 2023-06-29 MED ORDER — IPRATROPIUM-ALBUTEROL 0.5-2.5 (3) MG/3ML IN SOLN
3.0000 mL | Freq: Three times a day (TID) | RESPIRATORY_TRACT | Status: DC
  Administered 2023-06-29 – 2023-07-02 (×8): 3 mL via RESPIRATORY_TRACT
  Filled 2023-06-29 (×7): qty 3

## 2023-06-29 MED ORDER — SCOPOLAMINE 1 MG/3DAYS TD PT72
1.0000 | MEDICATED_PATCH | TRANSDERMAL | Status: DC
  Administered 2023-06-29 – 2023-07-05 (×3): 1.5 mg via TRANSDERMAL
  Filled 2023-06-29 (×4): qty 1

## 2023-06-29 MED ORDER — SODIUM CHLORIDE 0.9 % IV SOLN: INTRAVENOUS | Status: AC

## 2023-06-29 NOTE — Progress Notes (Signed)
PT Cancellation Note  Patient Details Name: Garrett Fleming MRN: [id number] DOB: [birth year]   Cancelled Treatment:    Reason Eval/Treat Not Completed: Other (comment)  Patient reports he is too sleepy to mobilize at this time. He reports he did not sleep last night and wants to get some rest. Explained benefits of activity and risks of inactivity with pt reporting he might get up later after he sleeps. Will reattempt as schedule permits.   Jerolyn Center, PT Acute Rehabilitation Services  Office [phone]  Zena Amos 06/29/2023, 12:03 PM

## 2023-06-29 NOTE — Progress Notes (Signed)
Occupational Therapy Treatment Patient Details Name: Garrett Fleming MRN: [id number] DOB: 07-02-1956 Today's Date: 06/29/2023   History of present illness 67 y.o. male admitted 9/22 with hypoxemic respiratory failure due to airway stenosis with infection s/p emergent awake tracheostomy. PMxh: larynx CA, left ankle fx, HTN, smoker, cocaine abuse, aortic aneurysm, blind Rt eye, PEG   OT comments  Pt complaining of fatigue this session. Nsg reports pt has not tolerated tube feeds and is scheduled for a CT. Wife reports increased n/v. Pt agreeable to ambulation on 28% FiO2. Pt given dry erase board to communicate needs however he chose not to use it this session. Able to ambulate @ 120 ft with 1 seated 5 min rest break. Increased WOB with desat into the mid 80s with quick rebound to above 90. Pt unsafely rushing to get back to his room because he felt he was going to "give out". Wife states that they live in a camper and have to take a bus to the [facility] to use the bathroom or bath. They do not have electricity at the camper and their only water source is the water hose. Pt demonstrates a functional decline, especially with activity tolerance, increasing his risk for falls. Updated DC plan to SNF for rehab. Acute OT will continue to follow.       If plan is discharge home, recommend the following:  Assistance with cooking/housework;A little help with bathing/dressing/bathroom   Equipment Recommendations  BSC/3in1    Recommendations for Other Services      Precautions / Restrictions Precautions Precautions: Fall;Other (comment) Precaution Comments: PEG, trach       Mobility Bed Mobility Overal bed mobility: Modified Independent                  Transfers Overall transfer level: Needs assistance Equipment used: Rollator (4 wheels) Transfers: Sit to/from Stand Sit to Stand: Contact guard assist           General transfer comment: rollator used     Balance     Sitting  balance-Leahy Scale: Good     Standing balance support:  (leaning forward due to fatigue and SOB) Standing balance-Leahy Scale: Fair                             ADL either performed or assessed with clinical judgement   ADL Overall ADL's : Needs assistance/impaired Eating/Feeding: NPO Eating/Feeding Details (indicate cue type and reason): per nsg not tolerating tube feeds Grooming: Set up;Sitting   Upper Body Bathing: Set up;Sitting   Lower Body Bathing: Contact guard assist;Sit to/from stand   Upper Body Dressing : Set up;Supervision/safety;Sitting   Lower Body Dressing: Minimal assistance Lower Body Dressing Details (indicate cue type and reason): wife helps at baseline with starting socks over toes             Functional mobility during ADLs: Minimal assistance;Rollator (4 wheels)      Extremity/Trunk Assessment Upper Extremity Assessment Upper Extremity Assessment: Generalized weakness   Lower Extremity Assessment Lower Extremity Assessment: Defer to PT evaluation        Vision       Perception     Praxis      Cognition Arousal: Alert Behavior During Therapy: Flat affect Overall Cognitive Status: Within Functional Limits for tasks assessed  General Comments: most likely close to baseline        Exercises Exercises: Other exercises Other Exercises Other Exercises: offered to start pt on T band strengthening however pt declined    Shoulder Instructions       General Comments FiO2 28% TC    Pertinent Vitals/ Pain       Pain Assessment Pain Assessment: Faces Pain Location: generalized discomfort Pain Descriptors / Indicators: Discomfort, Grimacing Pain Intervention(s): Limited activity within patient's tolerance  Home Living                                          Prior Functioning/Environment              Frequency  Min 1X/week        Progress Toward  Goals  OT Goals(current goals can now be found in the care plan section)  Progress towards OT goals: Progressing toward goals  Acute Rehab OT Goals Patient Stated Goal: to feel better OT Goal Formulation: With patient/family Time For Goal Achievement: 07/10/23 Potential to Achieve Goals: Good ADL Goals Pt Will Perform Grooming: with modified independence;standing Pt Will Transfer to Toilet: with modified independence;ambulating Additional ADL Goal #1: Pt/spouse will verbalize and demonstrate proper trach care techniques independently Additional ADL Goal #2: Pt will identify 2-3 strategies for improved communication within social situations without cueing  Plan      Co-evaluation                 AM-PAC OT "6 Clicks" Daily Activity     Outcome Measure   Help from another person eating meals?: Total Help from another person taking care of personal grooming?: A Little Help from another person toileting, which includes using toliet, bedpan, or urinal?: A Little Help from another person bathing (including washing, rinsing, drying)?: A Little Help from another person to put on and taking off regular upper body clothing?: A Little Help from another person to put on and taking off regular lower body clothing?: A Little 6 Click Score: 16    End of Session Equipment Utilized During Treatment: Oxygen;Gait belt;Rollator (4 wheels)  OT Visit Diagnosis: Unsteadiness on feet (R26.81);Muscle weakness (generalized) (M62.81);Other (comment)   Activity Tolerance Patient tolerated treatment well   Patient Left in bed;with call bell/phone within reach;with family/visitor present   Nurse Communication Mobility status;Other (comment) (encourage mobility)        Time: 1610-1640 OT Time Calculation (min): 30 min  Charges: OT General Charges $OT Visit: 1 Visit OT Treatments $Self Care/Home Management : 23-37 mins  Luisa Dago, OT/L   Acute OT Clinical Specialist Acute  Rehabilitation Services Pager [phone] Office [phone]   [facility] 06/29/2023, 4:55 PM

## 2023-06-29 NOTE — Progress Notes (Signed)
PROGRESS NOTE        PATIENT DETAILS Name: Garrett Fleming Age: 67 y.o. Sex: male Date of Birth: 10-25-1955 Admit Date: 06/21/2023 Admitting Physician Lorin Glass, MD WUJ:[id number], [name], MD  Brief Summary: Patient is a 67 y.o.  male with known history of laryngeal cancer (recent PEG tube insertion-followed at [facility])-presented with respiratory distress-required emergent tracheostomy by ENT.  See below for further details.  Significant events: 9/22>> Admit with emergent Trach by ENT 9/24>> transferred to [facility] 9/27>> tracheostomy changed by ENT  Significant studies: 9/26>> CT chest: Possible aspiration pneumonia, pneumomediastinum/subcutaneous emphysema. 9/26>> CT neck: Obstructing mass in the supraglottic larynx with extension to the inferior aspect of the thyroid cartilage.  Significant microbiology data: 9/22>> blood culture: No growth 9/22>> influenza/COVID PCR/RSV PCR: Negative  Procedures: 9/22>> tracheotomy by ENT  Consults: ENT PCCM  Subjective: Vomited twice-apparently these occur after coughing bouts-bringing up extensive thick colored sputum.  Refused to go to CT scan overnight.  Agreeable to go this morning.  Objective: Vitals: Blood pressure (!) 159/97, pulse 94, temperature 98.4 F (36.9 C), temperature source Oral, resp. rate 13, weight 58.2 kg, SpO2 96%.   Exam: Gen Exam:Alert awake-not in any distress HEENT:atraumatic, normocephalic Chest: Transmitted upper airway sounds CVS:S1S2 regular Abdomen:soft non tender, non distended Extremities:no edema Neurology: Non focal Skin: no rash  Pertinent Labs/Radiology:    Latest Ref Rng & Units 06/29/2023    3:56 AM 06/28/2023    3:31 PM 06/27/2023    6:38 AM  CBC  WBC 4.0 - 10.5 K/uL 5.6  6.2  8.3   Hemoglobin 13.0 - 17.0 g/dL 78.2  9.4  95.6   Hematocrit 39.0 - 52.0 % 31.8  29.6  32.6   Platelets 150 - 400 K/uL 283  265  247     Lab Results  Component Value  Date   NA 138 06/29/2023   K 3.6 06/29/2023   CL 97 (L) 06/29/2023   CO2 28 06/29/2023      Assessment/Plan: Acute hypoxic respiratory failure secondary to obstructing laryngeal mass-known history of laryngeal cancer S/p emergent tracheotomy by ENT on 9/22 Currently stable-on 6 L via trach collar Trach changed by ENT on 9/27  Per spouse-patient has an appointment with ENT at [facility] on 10/12.   Vomiting Occurring after coughing spells-awaiting CT abdomen.  Hold tube feeds Await CT abdomen  Dysphagia Secondary to laryngeal cancer Tube feeds-see above.  Aspiration pneumonia Although stable-on trach collar  He apparently is bringing up quite a bit of thick secretions and needing frequent suctioning through his tracheotomy.  Having coughing spells that trigger vomiting episodes Already on Mucomyst/bronchodilator nebs-will add hypertonic saline nebs-and chest PT Completed 5 days of Unasyn on 9/27 Repeat CXR today given significant transmitted upper airway sounds on exam.  AKI Hemodynamically mediated Resolved  Normocytic anemia Secondary to underlying malignancy Hb stable  HTN BP stable Amlodipine  Social issues Essentially homeless-primary residence without electricity/water-spouse/patient was living in a hotel before he got hospitalized-currently he does not have the financial means to go back to the hotel until 10/3 when he gets his monthly check. Social worker following.  Nutrition Status: Nutrition Problem: Severe Malnutrition Etiology: chronic illness (laryngeal mass) Signs/Symptoms: severe fat depletion, severe muscle depletion Interventions: Refer to RD note for recommendations, Tube feeding  Underweight: Estimated body mass index is  15.62 kg/m as calculated from the following:   Height as of 05/04/23: 6\' 4"  (1.93 m).   Weight as of this encounter: 58.2 kg.   Code status:   Code Status: Full Code   DVT Prophylaxis: enoxaparin  (LOVENOX) injection 40 mg Start: 06/22/23 1700 SCDs Start: 06/21/23 1845   Family Communication: None at bedside.   Disposition Plan: Status is: Inpatient Remains inpatient appropriate because: Severity of illness   Planned Discharge Destination:Home when appropriate by [facility] team-after trach change today.   Diet: Diet Order             Diet NPO time specified  Diet effective now                     Antimicrobial agents: Anti-infectives (From admission, onward)    Start     Dose/Rate Route Frequency Ordered Stop   06/21/23 2300  Ampicillin-Sulbactam (UNASYN) 3 g in sodium chloride 0.9 % 100 mL IVPB        3 g 200 mL/hr over 30 Minutes Intravenous Every 6 hours 06/21/23 1857 06/27/23 0659   06/21/23 1700  vancomycin (VANCOREADY) IVPB 1500 mg/300 mL  Status:  Discontinued        1,500 mg 150 mL/hr over 120 Minutes Intravenous  Once 06/21/23 1648 06/22/23 1552   06/21/23 1700  piperacillin-tazobactam (ZOSYN) IVPB 3.375 g        3.375 g 100 mL/hr over 30 Minutes Intravenous  Once 06/21/23 1648 06/21/23 1726        MEDICATIONS: Scheduled Meds:  acetylcysteine  4 mL Nebulization BID   amLODipine  5 mg Per Tube Daily   enoxaparin (LOVENOX) injection  40 mg Subcutaneous Q24H   feeding supplement (PROSource TF20)  60 mL Per Tube Daily   guaiFENesin  15 mL Per Tube BID   ipratropium-albuterol  3 mL Nebulization TID   mouth rinse  15 mL Mouth Rinse 4 times per day   polyethylene glycol  17 g Per Tube Daily   scopolamine  1 patch Transdermal Q72H   sodium chloride HYPERTONIC  4 mL Nebulization BID   Continuous Infusions:  sodium chloride 50 mL/hr at 06/29/23 0837   feeding supplement (OSMOLITE 1.5 CAL) Stopped (06/27/23 2333)   PRN Meds:.acetaminophen, albuterol, docusate sodium, hydrALAZINE, ondansetron (ZOFRAN) IV, mouth rinse, oxyCODONE, polyethylene glycol   I have personally reviewed following labs and imaging studies  LABORATORY DATA: CBC: Recent Labs   Lab 06/23/23 0509 06/24/23 0438 06/27/23 0638 06/28/23 1531 06/29/23 0356  WBC 10.3 7.9 8.3 6.2 5.6  NEUTROABS  --  6.7  --   --   --   HGB 11.4* 10.8* 10.4* 9.4* 10.2*  HCT 35.1* 34.7* 32.6* 29.6* 31.8*  MCV 97.2 96.9 97.3 95.2 93.5  PLT 257 239 247 265 283    Basic Metabolic Panel: Recent Labs  Lab 06/22/23 2002 06/23/23 0509 06/23/23 1711 06/24/23 0438 06/27/23 0638 06/28/23 1531 06/29/23 0356  NA  --  139  --  140 137 138 138  K  --  3.7  --  3.6 4.3 4.2 3.6  CL  --  102  --  103 100 97* 97*  CO2  --  26  --  28 28 25 28   GLUCOSE  --  111*  --  140* 103* 97 111*  BUN  --  36*  --  23 18 25* 24*  CREATININE  --  1.14  --  0.82 0.81 0.81 0.86  CALCIUM  --  8.4*  --  8.4* 8.5* 8.8* 8.8*  MG 2.4 2.3 2.2 2.1  --   --   --   PHOS 3.7 2.5 2.8 2.7  --   --   --     GFR: Estimated Creatinine Clearance: 70.5 mL/min (by C-G formula based on SCr of 0.86 mg/dL).  Liver Function Tests: Recent Labs  Lab 06/23/23 0509  AST 20  ALT 26  ALKPHOS 71  BILITOT 1.0  PROT 6.1*  ALBUMIN 2.9*   Recent Labs  Lab 06/28/23 1531  LIPASE 28   No results for input(s): "AMMONIA" in the last 168 hours.  Coagulation Profile: No results for input(s): "INR", "PROTIME" in the last 168 hours.  Cardiac Enzymes: No results for input(s): "CKTOTAL", "CKMB", "CKMBINDEX", "TROPONINI" in the last 168 hours.  BNP (last 3 results) No results for input(s): "PROBNP" in the last 8760 hours.  Lipid Profile: No results for input(s): "CHOL", "HDL", "LDLCALC", "TRIG", "CHOLHDL", "LDLDIRECT" in the last 72 hours.  Thyroid Function Tests: No results for input(s): "TSH", "T4TOTAL", "FREET4", "T3FREE", "THYROIDAB" in the last 72 hours.  Anemia Panel: No results for input(s): "VITAMINB12", "FOLATE", "FERRITIN", "TIBC", "IRON", "RETICCTPCT" in the last 72 hours.  Urine analysis:    Component Value Date/Time   COLORURINE YELLOW 11/17/2018 0144   APPEARANCEUR CLEAR 11/17/2018 0144   LABSPEC  >1.046 (H) 11/17/2018 0144   PHURINE 6.0 11/17/2018 0144   GLUCOSEU NEGATIVE 11/17/2018 0144   HGBUR NEGATIVE 11/17/2018 0144   BILIRUBINUR NEGATIVE 11/17/2018 0144   KETONESUR NEGATIVE 11/17/2018 0144   PROTEINUR NEGATIVE 11/17/2018 0144   NITRITE NEGATIVE 11/17/2018 0144   LEUKOCYTESUR NEGATIVE 11/17/2018 0144    Sepsis Labs: Lactic Acid, Venous    Component Value Date/Time   LATICACIDVEN 2.0 (HH) 06/21/2023 1655    MICROBIOLOGY: Recent Results (from the past 240 hour(s))  Resp panel by RT-PCR (RSV, Flu A&B, Covid) Anterior Nasal Swab     Status: None   Collection Time: 06/21/23  4:32 PM   Specimen: Anterior Nasal Swab  Result Value Ref Range Status   SARS Coronavirus 2 by RT PCR NEGATIVE NEGATIVE Final   Influenza A by PCR NEGATIVE NEGATIVE Final   Influenza B by PCR NEGATIVE NEGATIVE Final    Comment: (NOTE) The Xpert Xpress SARS-CoV-2/FLU/RSV plus assay is intended as an aid in the diagnosis of influenza from Nasopharyngeal swab specimens and should not be used as a sole basis for treatment. Nasal washings and aspirates are unacceptable for Xpert Xpress SARS-CoV-2/FLU/RSV testing.  Fact Sheet for Patients: BloggerCourse.com  Fact Sheet for Healthcare Providers: SeriousBroker.it  This test is not yet approved or cleared by the [place] FDA and has been authorized for detection and/or diagnosis of SARS-CoV-2 by FDA under an Emergency Use Authorization (EUA). This EUA will remain in effect (meaning this test can be used) for the duration of the COVID-19 declaration under Section 564(b)(1) of the Act, 21 U.S.C. section 360bbb-3(b)(1), unless the authorization is terminated or revoked.     Resp Syncytial Virus by PCR NEGATIVE NEGATIVE Final    Comment: (NOTE) Fact Sheet for Patients: BloggerCourse.com  Fact Sheet for Healthcare  Providers: SeriousBroker.it  This test is not yet approved or cleared by the [place] FDA and has been authorized for detection and/or diagnosis of SARS-CoV-2 by FDA under an Emergency Use Authorization (EUA). This EUA will remain in effect (meaning this test can be used) for the duration of the COVID-19 declaration under Section 564(b)(1) of the Act, 21 U.S.C. section  360bbb-3(b)(1), unless the authorization is terminated or revoked.  Performed at [facility], [address] [place], [place] [address]   Blood culture (routine x 2)     Status: None   Collection Time: 06/21/23  4:40 PM   Specimen: BLOOD  Result Value Ref Range Status   Specimen Description BLOOD SITE NOT SPECIFIED  Final   Special Requests   Final    BOTTLES DRAWN AEROBIC AND ANAEROBIC Blood Culture results may not be optimal due to an excessive volume of blood received in culture bottles   Culture   Final    NO GROWTH 5 DAYS Performed at [facility], [address], [place], [place] [address]    Report Status 06/26/2023 FINAL  Final  Blood culture (routine x 2)     Status: None   Collection Time: 06/21/23  4:55 PM   Specimen: BLOOD  Result Value Ref Range Status   Specimen Description BLOOD SITE NOT SPECIFIED  Final   Special Requests   Final    BOTTLES DRAWN AEROBIC AND ANAEROBIC Blood Culture adequate volume   Culture   Final    NO GROWTH 5 DAYS Performed at [facility], [address], [place], [place] [address]    Report Status 06/26/2023 FINAL  Final  MRSA Next Gen by PCR, Nasal     Status: None   Collection Time: 06/21/23  8:20 PM   Specimen: Nasal Mucosa; Nasal Swab  Result Value Ref Range Status   MRSA by PCR Next Gen NOT DETECTED NOT DETECTED Final    Comment: (NOTE) The GeneXpert MRSA Assay (FDA approved for NASAL specimens only), is one component of a comprehensive MRSA colonization surveillance program. It is not intended to diagnose MRSA  infection nor to guide or monitor treatment for MRSA infections. Test performance is not FDA approved in patients less than 15 years old. Performed at [facility], [address], [place], [place] [address]     RADIOLOGY STUDIES/RESULTS: No results found.   LOS: 8 days   Jeoffrey Massed, MD  [facility]    To contact the attending provider between 7A-7P or the covering provider during after hours 7P-7A, please log into the web site [email or web address] and access using universal [place] password for that web site. If you do not have the password, please call the hospital operator.  06/29/2023, 10:17 AM

## 2023-06-29 NOTE — Plan of Care (Signed)
  Problem: Education: Goal: Knowledge about tracheostomy care/management will improve Outcome: Progressing   Problem: Activity: Goal: Ability to tolerate increased activity will improve Outcome: Progressing   Problem: Health Behavior/Discharge Planning: Goal: Ability to manage tracheostomy will improve Outcome: Progressing   Problem: Respiratory: Goal: Patent airway maintenance will improve Outcome: Progressing   Problem: Role Relationship: Goal: Ability to communicate will improve Outcome: Progressing   Problem: Education: Goal: Knowledge of General Education information will improve Description: Including pain rating scale, medication(s)/side effects and non-pharmacologic comfort measures Outcome: Progressing   Problem: Health Behavior/Discharge Planning: Goal: Ability to manage health-related needs will improve Outcome: Progressing   Problem: Clinical Measurements: Goal: Ability to maintain clinical measurements within normal limits will improve Outcome: Progressing Goal: Will remain free from infection Outcome: Progressing Goal: Diagnostic test results will improve Outcome: Progressing Goal: Respiratory complications will improve Outcome: Progressing Goal: Cardiovascular complication will be avoided Outcome: Progressing   Problem: Activity: Goal: Risk for activity intolerance will decrease Outcome: Progressing   Problem: Nutrition: Goal: Adequate nutrition will be maintained Outcome: Progressing   Problem: Coping: Goal: Level of anxiety will decrease Outcome: Progressing   Problem: Elimination: Goal: Will not experience complications related to bowel motility Outcome: Progressing Goal: Will not experience complications related to urinary retention Outcome: Progressing   Problem: Pain Managment: Goal: General experience of comfort will improve Outcome: Progressing   Problem: Safety: Goal: Ability to remain free from injury will improve Outcome:  Progressing   Problem: Skin Integrity: Goal: Risk for impaired skin integrity will decrease Outcome: Progressing   

## 2023-06-29 NOTE — Progress Notes (Signed)
0230- pt refusing transport to CT scan at this time. Educated pt on importance/reasoning of CT scan. Pt states he wants to do scan in the morning. Pt has had copious amounts of trach secretions needing frequent suctioning throughout shift. Pt states he is tired and wants to rest.  Pt alert. VSS on trach collar.

## 2023-06-29 NOTE — Progress Notes (Signed)
PT Cancellation Note  Patient Details Name: Garrett Fleming MRN: [id number] DOB: 1956-09-01   Cancelled Treatment:    Reason Eval/Treat Not Completed: Patient at procedure or test/unavailable  Patient undergoing chest PT with Respiratory Therapy.    Jerolyn Center, PT Acute Rehabilitation Services  Office [phone]  Zena Amos 06/29/2023, 3:51 PM

## 2023-06-29 NOTE — TOC Progression Note (Signed)
Transition of Care [facility]) - Progression Note    Patient Details  Name: Garrett Fleming MRN: [id number] Date of Birth: Jul 01, 1956  Transition of [facility]) CM/SW Contact  Mearl Latin, LCSW Phone Number: 06/29/2023, 2:40 PM  Clinical Narrative:    CSW notified by medical team that patient's spouse is not able to care for patient with his trach. CSW will continue to follow for SNF placement; SNFs will not consider patient until his trach is 52 days old.         Expected Discharge Plan and Services                                               Social Determinants of Health (SDOH) Interventions SDOH Screenings   Food Insecurity: Low Risk  (05/06/2023)   Received from [facility]  Utilities: Medium Risk (05/06/2023)   Received from [facility]  Tobacco Use: High Risk (05/04/2023)    Readmission Risk Interventions     No data to display

## 2023-06-30 DIAGNOSIS — E43 Unspecified severe protein-calorie malnutrition: Secondary | ICD-10-CM | POA: Diagnosis not present

## 2023-06-30 DIAGNOSIS — C329 Malignant neoplasm of larynx, unspecified: Secondary | ICD-10-CM | POA: Diagnosis not present

## 2023-06-30 DIAGNOSIS — R1310 Dysphagia, unspecified: Secondary | ICD-10-CM | POA: Diagnosis not present

## 2023-06-30 DIAGNOSIS — Z430 Encounter for attention to tracheostomy: Secondary | ICD-10-CM | POA: Diagnosis not present

## 2023-06-30 DIAGNOSIS — R0902 Hypoxemia: Secondary | ICD-10-CM | POA: Diagnosis not present

## 2023-06-30 DIAGNOSIS — R061 Stridor: Secondary | ICD-10-CM | POA: Diagnosis not present

## 2023-06-30 LAB — GLUCOSE, CAPILLARY
Glucose-Capillary: 108 mg/dL — ABNORMAL HIGH (ref 70–99)
Glucose-Capillary: 109 mg/dL — ABNORMAL HIGH (ref 70–99)
Glucose-Capillary: 118 mg/dL — ABNORMAL HIGH (ref 70–99)
Glucose-Capillary: 118 mg/dL — ABNORMAL HIGH (ref 70–99)
Glucose-Capillary: 118 mg/dL — ABNORMAL HIGH (ref 70–99)

## 2023-06-30 LAB — MAGNESIUM: Magnesium: 2.1 mg/dL (ref 1.7–2.4)

## 2023-06-30 LAB — PHOSPHORUS: Phosphorus: 3.1 mg/dL (ref 2.5–4.6)

## 2023-06-30 MED ORDER — POLYETHYLENE GLYCOL 3350 17 G PO PACK: 17.0000 g | PACK | Freq: Every day | ORAL | Status: DC | PRN

## 2023-06-30 MED ORDER — DOCUSATE SODIUM 50 MG/5ML PO LIQD: 100.0000 mg | Freq: Two times a day (BID) | ORAL | Status: DC | PRN

## 2023-06-30 NOTE — Progress Notes (Signed)
Physical Therapy Treatment Patient Details Name: Garrett Fleming MRN: [id number] DOB: 10-Aug-1956 Today's Date: 06/30/2023   History of Present Illness 67 y.o. male admitted 9/22 with hypoxemic respiratory failure due to airway stenosis with infection s/p emergent awake tracheostomy. PMxh: larynx CA, left ankle fx, HTN, smoker, cocaine abuse, aortic aneurysm, blind Rt eye, PEG    PT Comments  Patient in soiled bed on arrival and reports he did not realize he had a BM. Nurse tech in to assist with cleaning pt, which pt was able to stand for. Utilized bil UE support in standing x 2 minutes and then seated rest. Progressed to ambulation with pt fatiguing more quickly than previous sessions. He required seated rest every 50 ft (x 4 reps)--turning to sit on rollator with min assist for safety. Pt recalls to lock brakes prior to sitting, but does not place rollator against a wall when available. Pt very fatigued on return to room and willing to sit up in recliner.     If plan is discharge home, recommend the following: Assistance with cooking/housework;A little help with bathing/dressing/bathroom;A little help with walking and/or transfers   Can travel by private vehicle     Yes  Equipment Recommendations  Rollator (4 wheels)    Recommendations for Other Services       Precautions / Restrictions Precautions Precautions: Fall;Other (comment) Precaution Comments: PEG, trach Restrictions Weight Bearing Restrictions: No     Mobility  Bed Mobility Overal bed mobility: Modified Independent Bed Mobility: Supine to Sit     Supine to sit: Modified independent (Device/Increase time), HOB elevated, Used rails     General bed mobility comments: assist for lines/leads    Transfers Overall transfer level: Needs assistance Equipment used: Rollator (4 wheels) Transfers: Sit to/from Stand Sit to Stand: Contact guard assist, Min assist           General transfer comment: from bed CGA; from  seat of rollator min assist with cues for safety and which way to turn relative to his lines; from rollator x 3 due to seated rests    Ambulation/Gait Ambulation/Gait assistance: Contact guard assist Gait Distance (Feet): 50 Feet (x4 with seated rests in between) Assistive device: Rollator (4 wheels) Gait Pattern/deviations: Trunk flexed, Shuffle, Decreased dorsiflexion - right Gait velocity: decreased     General Gait Details: 1 seated rest break 2/2 fatigue. Pt ambulates with supervision, increasing to CGA with pt demonstrating some L knee A/P instability as he fatigues; decreased RLE heel strike noted as well. Pt with forward trunk flexion onto rollator but rollator also not tall enough (regular height rollator & pt is 6'4").   Stairs             Wheelchair Mobility     Tilt Bed    Modified Rankin (Stroke Patients Only)       Balance Overall balance assessment: Mild deficits observed, not formally tested   Sitting balance-Leahy Scale: Good     Standing balance support:  (leaning forward due to fatigue and SOB) Standing balance-Leahy Scale: Poor Standing balance comment: seeks UE support in static standing                            Cognition Arousal: Alert Behavior During Therapy: Flat affect Overall Cognitive Status: Within Functional Limits for tasks assessed  General Comments: difficult to fully assess due to trach; refused to use white board to communicate        Exercises      General Comments General comments (skin integrity, edema, etc.): trach collar 28% with sats 90-95% (at times not registering when walking) HR 90s      Pertinent Vitals/Pain Pain Assessment Pain Assessment: No/denies pain    Home Living                          Prior Function            PT Goals (current goals can now be found in the care plan section) Acute Rehab PT Goals Patient Stated Goal: return  home Time For Goal Achievement: 07/06/23 Potential to Achieve Goals: Good Progress towards PT goals: Progressing toward goals    Frequency    Min 1X/week      PT Plan      Co-evaluation              AM-PAC PT "6 Clicks" Mobility   Outcome Measure  Help needed turning from your back to your side while in a flat bed without using bedrails?: None Help needed moving from lying on your back to sitting on the side of a flat bed without using bedrails?: None Help needed moving to and from a bed to a chair (including a wheelchair)?: A Little Help needed standing up from a chair using your arms (e.g., wheelchair or bedside chair)?: A Little Help needed to walk in hospital room?: A Little Help needed climbing 3-5 steps with a railing? : A Little 6 Click Score: 20    End of Session Equipment Utilized During Treatment: Oxygen;Gait belt Activity Tolerance: Patient limited by fatigue Patient left: with call bell/phone within reach;in chair Nurse Communication: Mobility status PT Visit Diagnosis: Other abnormalities of gait and mobility (R26.89);Muscle weakness (generalized) (M62.81)     Time: 1121-1207 PT Time Calculation (min) (ACUTE ONLY): 46 min  Charges:    $Gait Training: 38-52 mins PT General Charges $$ ACUTE PT VISIT: 1 Visit                      Jerolyn Center, PT Acute Rehabilitation Services  Office [phone]    Zena Amos 06/30/2023, 12:33 PM

## 2023-06-30 NOTE — Progress Notes (Signed)
PROGRESS NOTE        PATIENT DETAILS Name: Garrett Fleming Age: 67 y.o. Sex: male Date of Birth: October 11, 1955 Admit Date: 06/21/2023 Admitting Physician Lorin Glass, MD [id number], [name], MD  Brief Summary: Patient is a 67 y.o.  male with known history of laryngeal cancer (recent PEG tube insertion-followed at [facility])-presented with respiratory distress-required emergent tracheostomy by ENT.  See below for further details.  Significant events: 9/22>> Admit with emergent Trach by ENT 9/24>> transferred to [facility] 9/27>> tracheostomy changed by ENT  Significant studies: 9/26>> CT chest: Possible aspiration pneumonia, pneumomediastinum/subcutaneous emphysema. 9/26>> CT neck: Obstructing mass in the supraglottic larynx with extension to the inferior aspect of the thyroid cartilage. 9/30>> CT abdomen/pelvis: No acute abnormality  Significant microbiology data: 9/22>> blood culture: No growth 9/22>> influenza/COVID PCR/RSV PCR: Negative  Procedures: 9/22>> tracheotomy by ENT  Consults: ENT PCCM  Subjective: No vomiting overnight.  Feels much better.  Objective: Vitals: Blood pressure 110/82, pulse 82, temperature 98.5 F (36.9 C), temperature source Oral, resp. rate 14, weight 54.1 kg, SpO2 99%.   Exam: Gen Exam:Alert awake-not in any distress HEENT:atraumatic, normocephalic Chest: B/L clear to auscultation anteriorly CVS:S1S2 regular Abdomen:soft non tender, non distended Extremities:no edema Neurology: Non focal Skin: no rash  Pertinent Labs/Radiology:    Latest Ref Rng & Units 06/29/2023    3:56 AM 06/28/2023    3:31 PM 06/27/2023    6:38 AM  CBC  WBC 4.0 - 10.5 K/uL 5.6  6.2  8.3   Hemoglobin 13.0 - 17.0 g/dL 63.8  9.4  75.6   Hematocrit 39.0 - 52.0 % 31.8  29.6  32.6   Platelets 150 - 400 K/uL 283  265  247     Lab Results  Component Value Date   NA 138 06/29/2023   K 3.6 06/29/2023   CL 97 (L) 06/29/2023   CO2 28  06/29/2023      Assessment/Plan: Acute hypoxic respiratory failure secondary to obstructing laryngeal mass-known history of laryngeal cancer S/p emergent tracheotomy by ENT on 9/22 Currently stable-on 6 L via trach collar Trach changed by ENT on 9/27  Per spouse-patient has an appointment with ENT at [facility] on 10/12.   Vomiting Occurring after coughing spells CT abdomen nonacute Tube feeds restarted yesterday evening-slowly increase rate to goal over the next several days.  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  Aspiration pneumonia Although stable-on trach collar  Over the past several days-was bringing up thick secretions-coughing spells-he is now much better with Mucomyst/hypertonic saline nebs and chest PT.   Continue to mobilize  Completed 5 days of Unasyn on 9/27-repeat imaging negative for pneumonia.    AKI Hemodynamically mediated Resolved  Normocytic anemia Secondary to underlying malignancy Hb stable  HTN BP stable Amlodipine  Social issues Essentially homeless-primary residence without electricity/water-spouse/patient was living in a hotel before he got hospitalized-currently he does not have the financial means to go back to the hotel until 10/3 when he gets his monthly check. Social worker following.  Nutrition Status: Nutrition Problem: Severe Malnutrition Etiology: chronic illness (laryngeal mass) Signs/Symptoms: severe fat depletion, severe muscle depletion Interventions: Refer to RD note for recommendations, Tube feeding  Underweight: Estimated body mass index is 14.52 kg/m as calculated from the following:   Height as of 05/04/23: 6\' 4"  (1.93 m).  Weight as of this encounter: 54.1 kg.   Code status:   Code Status: Full Code   DVT Prophylaxis: enoxaparin (LOVENOX) injection 40 mg Start: 06/22/23 1700 SCDs Start: 06/21/23 1845   Family Communication: None at bedside.   Disposition  Plan: Status is: Inpatient Remains inpatient appropriate because: Severity of illness   Planned Discharge Destination:Home when appropriate by [facility] team-after trach change today.   Diet: Diet Order             Diet NPO time specified  Diet effective now                     Antimicrobial agents: Anti-infectives (From admission, onward)    Start     Dose/Rate Route Frequency Ordered Stop   06/21/23 2300  Ampicillin-Sulbactam (UNASYN) 3 g in sodium chloride 0.9 % 100 mL IVPB        3 g 200 mL/hr over 30 Minutes Intravenous Every 6 hours 06/21/23 1857 06/27/23 0659   06/21/23 1700  vancomycin (VANCOREADY) IVPB 1500 mg/300 mL  Status:  Discontinued        1,500 mg 150 mL/hr over 120 Minutes Intravenous  Once 06/21/23 1648 06/22/23 1552   06/21/23 1700  piperacillin-tazobactam (ZOSYN) IVPB 3.375 g        3.375 g 100 mL/hr over 30 Minutes Intravenous  Once 06/21/23 1648 06/21/23 1726        MEDICATIONS: Scheduled Meds:  acetylcysteine  4 mL Nebulization BID   amLODipine  5 mg Per Tube Daily   enoxaparin (LOVENOX) injection  40 mg Subcutaneous Q24H   feeding supplement (PROSource TF20)  60 mL Per Tube Daily   guaiFENesin  15 mL Per Tube BID   ipratropium-albuterol  3 mL Nebulization TID   mouth rinse  15 mL Mouth Rinse 4 times per day   polyethylene glycol  17 g Per Tube Daily   scopolamine  1 patch Transdermal Q72H   sodium chloride HYPERTONIC  4 mL Nebulization BID   Continuous Infusions:  sodium chloride 50 mL/hr at 06/29/23 1759   feeding supplement (OSMOLITE 1.5 CAL) 1,000 mL (06/29/23 2350)   PRN Meds:.acetaminophen, albuterol, docusate sodium, hydrALAZINE, ondansetron (ZOFRAN) IV, mouth rinse, oxyCODONE, polyethylene glycol   I have personally reviewed following labs and imaging studies  LABORATORY DATA: CBC: Recent Labs  Lab 06/24/23 0438 06/27/23 0638 06/28/23 1531 06/29/23 0356  WBC 7.9 8.3 6.2 5.6  NEUTROABS 6.7  --   --   --   HGB 10.8* 10.4*  9.4* 10.2*  HCT 34.7* 32.6* 29.6* 31.8*  MCV 96.9 97.3 95.2 93.5  PLT 239 247 265 283    Basic Metabolic Panel: Recent Labs  Lab 06/23/23 1711 06/24/23 0438 06/27/23 0638 06/28/23 1531 06/29/23 0356 06/30/23 0344  NA  --  140 137 138 138  --   K  --  3.6 4.3 4.2 3.6  --   CL  --  103 100 97* 97*  --   CO2  --  28 28 25 28   --   GLUCOSE  --  140* 103* 97 111*  --   BUN  --  23 18 25* 24*  --   CREATININE  --  0.82 0.81 0.81 0.86  --   CALCIUM  --  8.4* 8.5* 8.8* 8.8*  --   MG 2.2 2.1  --   --   --  2.1  PHOS 2.8 2.7  --   --   --  3.1  GFR: Estimated Creatinine Clearance: 65.5 mL/min (by C-G formula based on SCr of 0.86 mg/dL).  Liver Function Tests: No results for input(s): "AST", "ALT", "ALKPHOS", "BILITOT", "PROT", "ALBUMIN" in the last 168 hours.  Recent Labs  Lab 06/28/23 1531  LIPASE 28   No results for input(s): "AMMONIA" in the last 168 hours.  Coagulation Profile: No results for input(s): "INR", "PROTIME" in the last 168 hours.  Cardiac Enzymes: No results for input(s): "CKTOTAL", "CKMB", "CKMBINDEX", "TROPONINI" in the last 168 hours.  BNP (last 3 results) No results for input(s): "PROBNP" in the last 8760 hours.  Lipid Profile: No results for input(s): "CHOL", "HDL", "LDLCALC", "TRIG", "CHOLHDL", "LDLDIRECT" in the last 72 hours.  Thyroid Function Tests: No results for input(s): "TSH", "T4TOTAL", "FREET4", "T3FREE", "THYROIDAB" in the last 72 hours.  Anemia Panel: No results for input(s): "VITAMINB12", "FOLATE", "FERRITIN", "TIBC", "IRON", "RETICCTPCT" in the last 72 hours.  Urine analysis:    Component Value Date/Time   COLORURINE YELLOW 11/17/2018 0144   APPEARANCEUR CLEAR 11/17/2018 0144   LABSPEC >1.046 (H) 11/17/2018 0144   PHURINE 6.0 11/17/2018 0144   GLUCOSEU NEGATIVE 11/17/2018 0144   HGBUR NEGATIVE 11/17/2018 0144   BILIRUBINUR NEGATIVE 11/17/2018 0144   KETONESUR NEGATIVE 11/17/2018 0144   PROTEINUR NEGATIVE 11/17/2018 0144    NITRITE NEGATIVE 11/17/2018 0144   LEUKOCYTESUR NEGATIVE 11/17/2018 0144    Sepsis Labs: Lactic Acid, Venous    Component Value Date/Time   LATICACIDVEN 2.0 (HH) 06/21/2023 1655    MICROBIOLOGY: Recent Results (from the past 240 hour(s))  Resp panel by RT-PCR (RSV, Flu A&B, Covid) Anterior Nasal Swab     Status: None   Collection Time: 06/21/23  4:32 PM   Specimen: Anterior Nasal Swab  Result Value Ref Range Status   SARS Coronavirus 2 by RT PCR NEGATIVE NEGATIVE Final   Influenza A by PCR NEGATIVE NEGATIVE Final   Influenza B by PCR NEGATIVE NEGATIVE Final    Comment: (NOTE) The Xpert Xpress SARS-CoV-2/FLU/RSV plus assay is intended as an aid in the diagnosis of influenza from Nasopharyngeal swab specimens and should not be used as a sole basis for treatment. Nasal washings and aspirates are unacceptable for Xpert Xpress SARS-CoV-2/FLU/RSV testing.  Fact Sheet for Patients: BloggerCourse.com  Fact Sheet for Healthcare Providers: SeriousBroker.it  This test is not yet approved or cleared by the [place] FDA and has been authorized for detection and/or diagnosis of SARS-CoV-2 by FDA under an Emergency Use Authorization (EUA). This EUA will remain in effect (meaning this test can be used) for the duration of the COVID-19 declaration under Section 564(b)(1) of the Act, 21 U.S.C. section 360bbb-3(b)(1), unless the authorization is terminated or revoked.     Resp Syncytial Virus by PCR NEGATIVE NEGATIVE Final    Comment: (NOTE) Fact Sheet for Patients: BloggerCourse.com  Fact Sheet for Healthcare Providers: SeriousBroker.it  This test is not yet approved or cleared by the [place] FDA and has been authorized for detection and/or diagnosis of SARS-CoV-2 by FDA under an Emergency Use Authorization (EUA). This EUA will remain in effect (meaning this test can be  used) for the duration of the COVID-19 declaration under Section 564(b)(1) of the Act, 21 U.S.C. section 360bbb-3(b)(1), unless the authorization is terminated or revoked.  Performed at [facility], [address] [place], [place] [address]   Blood culture (routine x 2)     Status: None   Collection Time: 06/21/23  4:40 PM   Specimen: BLOOD  Result Value Ref  Range Status   Specimen Description BLOOD SITE NOT SPECIFIED  Final   Special Requests   Final    BOTTLES DRAWN AEROBIC AND ANAEROBIC Blood Culture results may not be optimal due to an excessive volume of blood received in culture bottles   Culture   Final    NO GROWTH 5 DAYS Performed at [facility], [address], [place], [place] [address]    Report Status 06/26/2023 FINAL  Final  Blood culture (routine x 2)     Status: None   Collection Time: 06/21/23  4:55 PM   Specimen: BLOOD  Result Value Ref Range Status   Specimen Description BLOOD SITE NOT SPECIFIED  Final   Special Requests   Final    BOTTLES DRAWN AEROBIC AND ANAEROBIC Blood Culture adequate volume   Culture   Final    NO GROWTH 5 DAYS Performed at [facility], [address] [place], [place] [address]    Report Status 06/26/2023 FINAL  Final  MRSA Next Gen by PCR, Nasal     Status: None   Collection Time: 06/21/23  8:20 PM   Specimen: Nasal Mucosa; Nasal Swab  Result Value Ref Range Status   MRSA by PCR Next Gen NOT DETECTED NOT DETECTED Final    Comment: (NOTE) The GeneXpert MRSA Assay (FDA approved for NASAL specimens only), is one component of a comprehensive MRSA colonization surveillance program. It is not intended to diagnose MRSA infection nor to guide or monitor treatment for MRSA infections. Test performance is not FDA approved in patients less than 79 years old. Performed at [facility], [address] [place], [place] [address]     RADIOLOGY STUDIES/RESULTS: CT ABDOMEN PELVIS W CONTRAST  Result Date:  06/29/2023 CLINICAL DATA:  Abdominal pain EXAM: CT ABDOMEN AND PELVIS WITH CONTRAST TECHNIQUE: Multidetector CT imaging of the abdomen and pelvis was performed using the standard protocol following bolus administration of intravenous contrast. RADIATION DOSE REDUCTION: This exam was performed according to the departmental dose-optimization program which includes automated exposure control, adjustment of the mA and/or kV according to patient size and/or use of iterative reconstruction technique. CONTRAST:  75mL OMNIPAQUE IOHEXOL 350 MG/ML SOLN COMPARISON:  06/27/2023 FINDINGS: Lower chest: No acute abnormality. Hepatobiliary: Gallbladder is within normal limits. Simple cyst is noted within the right lobe of the liver. Pancreas: Unremarkable. No pancreatic ductal dilatation or surrounding inflammatory changes. Spleen: Normal in size without focal abnormality. Adrenals/Urinary Tract: Adrenal glands are within normal limits. Kidneys demonstrate a normal enhancement pattern bilaterally. No renal calculi are seen. A few small cysts are noted within the right kidney. No follow-up is recommended. The bladder is well distended. Stomach/Bowel: Gastrostomy catheter is noted within the duodenal bulb. This should withdrawn into the stomach. Mild fluid is noted throughout the colon consistent with a diarrheal state. The appendix is not well visualized and may have been surgically removed. No inflammatory changes to suggest appendicitis are noted. Small bowel shows no obstructive changes. Vascular/Lymphatic: Tortuous aorta is noted. No aneurysmal dilatation is seen. No lymphadenopathy is noted. Reproductive: Prostate is unremarkable. Other: No abdominal wall hernia or abnormality. No abdominopelvic ascites. Musculoskeletal: Degenerative changes of the lumbar spine are noted. IMPRESSION: Gastrostomy catheter within the duodenal bulb. Fluid within the colon likely related to a diarrheal state. No obstructive changes are seen. No  other acute abnormality is noted. Electronically Signed   By: Alcide Clever M.D.   On: 06/29/2023 21:03   DG Chest Port 1V  same Day  Result Date: 06/29/2023 CLINICAL DATA:  Shortness of breath. EXAM: PORTABLE CHEST 1 VIEW COMPARISON:  June 25, 2023. FINDINGS: Stable cardiomediastinal silhouette. Stent graft seen in transverse aortic arch. Tracheostomy in grossly good position. Lungs are clear. Bony thorax is unremarkable. IMPRESSION: No active disease. Electronically Signed   By: Lupita Raider M.D.   On: 06/29/2023 12:58     LOS: 9 days   Jeoffrey Massed, MD  [facility]    To contact the attending provider between 7A-7P or the covering provider during after hours 7P-7A, please log into the web site [email or web address] and access using universal [place] password for that web site. If you do not have the password, please call the hospital operator.  06/30/2023, 10:38 AM

## 2023-06-30 NOTE — Progress Notes (Signed)
CPT held at this time. Patient just finished with PT and is sitting in chair.

## 2023-06-30 NOTE — Plan of Care (Signed)

## 2023-07-01 ENCOUNTER — Encounter (HOSPITAL_COMMUNITY): Payer: Self-pay | Admitting: Internal Medicine

## 2023-07-01 DIAGNOSIS — E43 Unspecified severe protein-calorie malnutrition: Secondary | ICD-10-CM | POA: Diagnosis not present

## 2023-07-01 DIAGNOSIS — R0902 Hypoxemia: Secondary | ICD-10-CM | POA: Diagnosis not present

## 2023-07-01 DIAGNOSIS — R061 Stridor: Secondary | ICD-10-CM | POA: Diagnosis not present

## 2023-07-01 DIAGNOSIS — Z430 Encounter for attention to tracheostomy: Secondary | ICD-10-CM | POA: Diagnosis not present

## 2023-07-01 LAB — BASIC METABOLIC PANEL
Anion gap: 6 (ref 5–15)
BUN: 33 mg/dL — ABNORMAL HIGH (ref 8–23)
CO2: 26 mmol/L (ref 22–32)
Calcium: 8.2 mg/dL — ABNORMAL LOW (ref 8.9–10.3)
Chloride: 104 mmol/L (ref 98–111)
Creatinine, Ser: 0.79 mg/dL (ref 0.61–1.24)
GFR, Estimated: >60 mL/min (ref >60)
Glucose, Bld: 109 mg/dL — ABNORMAL HIGH (ref 70–99)
Potassium: 3.8 mmol/L (ref 3.5–5.1)
Sodium: 136 mmol/L (ref 135–145)

## 2023-07-01 LAB — CBC
HCT: 22.8 % — ABNORMAL LOW (ref 39.0–52.0)
HCT: 25.6 % — ABNORMAL LOW (ref 39.0–52.0)
Hemoglobin: 7.5 g/dL — ABNORMAL LOW (ref 13.0–17.0)
Hemoglobin: 8.2 g/dL — ABNORMAL LOW (ref 13.0–17.0)
MCH: 30.9 pg (ref 26.0–34.0)
MCH: 31.4 pg (ref 26.0–34.0)
MCHC: 32.9 g/dL (ref 30.0–36.0)
MCHC: 32 g/dL (ref 30.0–36.0)
MCV: 93.8 fL (ref 80.0–100.0)
MCV: 98.1 fL (ref 80.0–100.0)
Platelets: 283 10*3/uL (ref 150–400)
Platelets: 291 10*3/uL (ref 150–400)
RBC: 2.43 MIL/uL — ABNORMAL LOW (ref 4.22–5.81)
RBC: 2.61 MIL/uL — ABNORMAL LOW (ref 4.22–5.81)
RDW: 13.2 % (ref 11.5–15.5)
RDW: 13.5 % (ref 11.5–15.5)
WBC: 5.8 10*3/uL (ref 4.0–10.5)
WBC: 6.6 10*3/uL (ref 4.0–10.5)
nRBC: 0 % (ref 0.0–0.2)
nRBC: 0 % (ref 0.0–0.2)

## 2023-07-01 LAB — GLUCOSE, CAPILLARY
Glucose-Capillary: 100 mg/dL — ABNORMAL HIGH (ref 70–99)
Glucose-Capillary: 109 mg/dL — ABNORMAL HIGH (ref 70–99)
Glucose-Capillary: 114 mg/dL — ABNORMAL HIGH (ref 70–99)
Glucose-Capillary: 123 mg/dL — ABNORMAL HIGH (ref 70–99)
Glucose-Capillary: 126 mg/dL — ABNORMAL HIGH (ref 70–99)
Glucose-Capillary: 128 mg/dL — ABNORMAL HIGH (ref 70–99)
Glucose-Capillary: 137 mg/dL — ABNORMAL HIGH (ref 70–99)

## 2023-07-01 LAB — PHOSPHORUS: Phosphorus: 3 mg/dL (ref 2.5–4.6)

## 2023-07-01 LAB — MAGNESIUM: Magnesium: 2 mg/dL (ref 1.7–2.4)

## 2023-07-01 MED ORDER — PANTOPRAZOLE 80MG IVPB - SIMPLE MED
80.0000 mg | Freq: Once | INTRAVENOUS | Status: AC
  Administered 2023-07-01: 80 mg via INTRAVENOUS
  Filled 2023-07-01 (×2): qty 100

## 2023-07-01 MED ORDER — PANTOPRAZOLE SODIUM 40 MG IV SOLR
40.0000 mg | Freq: Two times a day (BID) | INTRAVENOUS | Status: DC
  Administered 2023-07-05 – 2023-07-06 (×3): 40 mg via INTRAVENOUS
  Filled 2023-07-01 (×3): qty 10

## 2023-07-01 MED ORDER — SODIUM CHLORIDE 0.9 % IV SOLN: INTRAVENOUS | Status: DC

## 2023-07-01 MED ORDER — PANTOPRAZOLE INFUSION (NEW) - SIMPLE MED
8.0000 mg/h | INTRAVENOUS | Status: AC
  Administered 2023-07-01 – 2023-07-03 (×4): 8 mg/h via INTRAVENOUS
  Filled 2023-07-01 (×7): qty 100

## 2023-07-01 NOTE — Discharge Instructions (Signed)
[employer] assistance programs Crisis assistance programs  -[facility] Arts development officer. If you are experiencing homelessness in [place], your first point of contact should be Pensions consultant. You can reach Coordinated Entry by calling (336) [phone] or by emailing [email or web address] [email or web address].  Community access points: [employer] [phone], [place]) every Tuesday from 9am-10am. [facility] ([address], [place]) every Wednesday from 8am-9am.   -The [employer] 506-784-2854) offers several services to local families, as funding allows. The Emergency Assistance Program (EAP), which they administer, provides household goods, free food, clothing, and financial aid to people in need in the [facility] area. The EAP program does have some qualification, and counselors will interview clients for financial assistance by written referral only. Referrals need to be made by the Department of Social Services or by other EAP approved human services agencies or charities in the area.  -[facility] of [employer], which can be reached at [phone], offers emergency assistance programs for those in need of help, such as food, rent assistance, a soup kitchen, shelter, and clothing. They are based in [facility] but provide a number of services to those that qualify for assistance.   [facility] Department of Social Services may be able to offer temporary financial assistance and cash grants for paying rent and utilities, Help may be provided for local county residents who may be experiencing personal crisis when other resources, including government programs, are not available. Call [phone]  -High [employer] is a [employer] agency, The organization can offer emergency assistance for paying rent, [employer], utilities, food,  household products and furniture. They offer extensive emergency and transitional housing for families, children and single women, and also run a Boy's and [employer]. Thrift Shops, Secondary school teacher, and other aid offered too. [address] [place], [place] [address], [phone]  -[place] Low Income Energy Assistance Program -- This is offered for [facility] families. The federal government created [employer] Program provides a one-time cash grant payment to help eligible low-income families pay their electric and heating bills. [address] [place], [place] [address], [phone]  -[place] Emergency Assistance -- A program offers emergency utility and rent funds for greater [employer] area residents. The program can also provide counseling and referrals to charities and government programs. Also provides food and a free meal program that serves lunch Mondays - Saturdays and dinner seven days per week to individuals in the community. [address] [place], [place] [address], [phone]  -[employer] - Offers affordable apartment and housing communities across      [place] and [place]. The low income and seniors can access public housing, rental assistance to qualified applicants, and apply for the section 8 rent subsidy program. Other programs include Chiropractor and Engineer, maintenance. [address], [place], [place] [address], dial [phone].  -The [facility] provides transitional housing to veterans and the disabled. Clients will also access other services too, including assistance in applying for Disability, life skills classes, case management, and assistance in finding permanent housing. [address] [place], [place] [address], call [phone]  -[facility] Transitional Housing through [employer] is for  people who were just evicted or that are formerly homeless. The non-profit will also help then gain self-sufficiency, find a home or apartment to  live in, and also provides information on rent assistance when needed. Phone [phone]  -The [place] [employer] [facility] helps low income, elderly, or disabled residents in seven counties in the [place] ([place], [place], [place], [place], [place], [place], [place], and [place]) save energy and reduce their utility bills by improving energy efficiency. Phone [phone].  -[employer] is located in the [facility] in the [employer], [address], [address], [place], [place] [address]. Parking is in the rear of the building. Phone: [phone]   General Email: info@gsohc .org  [facility] provides free housing counseling assistance in locating affordable rental housing or housing with support services for families and individuals in crisis and the chronically homeless. We provide potential resources for other housing needs like utilities. Our trained counselors also work with clients on budgeting and financial literacy in effort to empower them to take control of their financial situations. [employer] collaborates with homeless service providers and other stakeholders as part of the [employer] COC (Continuum of Care). The (COC) is a regional/local planning body that coordinates housing and services funding for homeless families and individuals. The role of [facility] in the COC is through housing counseling to work with people we serve on diversion strategies for those that are at imminent risk of becoming homeless. We also work with the Coordinated Assessment/Entry Specialist who attempts to find temporary solutions and/or connects the people to Housing First, Rapid Re-housing or transitional housing programs. Our Homelessness Prevention Housing Counselors meet  with clients on business days (Monday-Fridays, except scheduled holidays) from 8:30 am to 4:30 pm.  Legal assistance for evictions, foreclosure, and more -If you need free legal advice on civil issues, such as foreclosures, evictions, Electronics engineer, government programs, domestic issues and more, Armed forces operational officer Aid of [facility]) is a Associate Professor firm that provides free legal services and counsel to lower income people, seniors, disabled, and others, The goal is to ensure everyone has access to justice and fair representation. Call them at [phone].  [facility] for Housing and Community Studies can provide info about obtaining legal assistance with evictions. Phone [phone].  Data processing manager  The [employer], [employer]. offers job and Dispensing optician. Resources are focused on helping students obtain the skills and experiences that are necessary to compete in today's challenging and tight job market. The non-profit faith-based community action agency offers internship trainings as well as classroom instruction. Classes are tailored to meet the needs of people in the [facility] region. [place], [place] [address], [phone]  Foreclosure prevention/Debt Services Family Services of the [employer] Credit Counseling Service inludes debt and foreclosure prevention programs for local families. This includes money management, financial advice, budget review and development of a written action plan with a Pensions consultant to help solve specific individual financial problems. In addition, housing and mortgage counselors can also provide pre- and post-purchase homeownership counseling, default resolution counseling (to prevent foreclosure) and reverse mortgage counseling. A Debt Management Program allows people and families with a high level of credit card or medical debt to consolidate and repay consumer debt and loans to  creditors and rebuild positive credit ratings and scores. Contact (336) [phone].  Community clinics in [place] -Health Department [facility]: [address] [place], [place], [address]. [phone].  -Health Department [facility]: [address], [place], [address]. [phone].  -[facility] Network offers medical care through a group of  doctors, pharmacies and other healthcare related agencies that offer services for low income, uninsured adults in [place]. Also offers adult Dental care and assistance with applying for an [employer]. Call [phone].   [facility]. This center provides low-cost health care to those without health insurance. Services offered include an onsite pharmacy. Phone [phone]. [address], [place].  -Medication Assistance Program serves as a link between pharmaceutical companies and patients to provide low cost or free prescription medications. This service is available for residents who meet certain income restrictions and have no insurance coverage. PLEASE CALL [phone] Ginette Otto) OR [phone] ([place])  -One Step Further: Materials engineer, The [employer] Support & Nutrition Program, [employer]. Call [phone]/ [phone].  Food pantry and assistance -[facility]: [address], [place] [address]. Phone [phone]  -[facility]: [address] [place], [place] [address]. [phone].  -Missionary Ministry: has the purpose of visiting the sick and shut-ins and provide for needs in the surrounding communities. Call ([phone]. Email: [email or web address] .com This program provides: Food box for seniors, Financial assistance, Food to meet basic nutritional needs.  -Meals on Wheels with Senior Resources: [facility] residents age 32 and over who are homebound and  unable to obtain and prepare a nutritious meal for themselves are eligible for this service. There may be a waiting list in certain parts of [facility] if the route in that area is full. If you are in [facility] and [place] call [phone] to register. For all other areas call [phone] to register.  -Greater Dietitian: https://findfood.BargainContractor.si  TRANSPORTATION: -[employer] Department of Health: Call [facility] and [employer] at [phone] for details. AttractionGuides.es  -Access [place]: Access [place] is the [employer] Agency's shared-ride transportation service for eligible riders who have a disability that prevents them from riding the fixed route bus. Call [phone]. Access [place] riders must pay a fare of $1.50 per trip, or may purchase a 10-ride punch card for $14.00 ($1.40 per ride) or a 40-ride punch card for $48.00 ($1.20 per ride).  -The Shepherd's WHEELS rideshare transportation service is provided for senior citizens (60+) who live independently within [place] city limits and are unable to drive or have limited access to transportation. Call [phone] to schedule an appointment.  -[place] Transportation: For Medicare or Medicaid recipients call [phone]?[facility], wheelchair Zenaida Niece, and ambulatory quotes available.   FLEEING VIOLENCE: -Family Services of the [place]- 24/7 Crisis line (517)[phone]) -[facility] Justice Centers: (336) 641-SAFE (442)[phone])  [place] 2-1-1 is another useful way to locate resources in the community. Visit ShedSizes.ch to find service information online. If you need additional assistance, 2-1-1 Referral Specialists are available 24 hours a day, every day by dialing 2-1-1 or [phone] from any phone. The call is free, confidential, and available in any language.  Affordable Housing  Search http://www.nchousingsearch.org  [facility])   M-F 8a-3p [address] [place], [place] [address] [phone] Services include: laundry, barbering, support groups, case management, phone & computer access, showers, AA/NA mtgs, mental health/substance abuse nurse, job skills class, disability information, VA assistance, spiritual classes, etc. Winter Shelter available when temperatures are less than 32 degrees.   HOMELESS SHELTERS [facility] at [facility]- Call [phone] ext. [phone] or ext. 336. Located at [address], [place], [place] [address]  [facility] Mens Shelter- Call (  336) P8635165. Located at [address], [place] [address].  [facility] [employer]. Call [phone]. Office located at [address] [place] [address].  [facility] Family [facility] through [facility] [phone].  [facility]- Call [phone]. Located at [address] [place], [place], [place] [address].  [facility]-For Pregnant mothers. Call [phone]. Located at [address] [place], [address].  [facility]-For men in [place]. Call [phone].  Home of [employer] for [employer] [phone]. Office located at [address] [place], [address].  [employer] be agreeable to help with chores. Call [phone] ext. [phone].  Men's: [address] [place], [place] [address]. Women's: [facility]  [address] [place], [place] [address]  Crisis Services [facility] Mobile Crisis Management- [phone]  [facility] [address] [place], [place] [address]. Phone: [phone]

## 2023-07-01 NOTE — Progress Notes (Signed)
Brief note Significant drop in hemoglobin earlier-had not had a bowel movement in several days-however this evening-has had a grossly melanotic stools. Hemodynamically stable Type and screen PPI infusion Stat CBC Transfuse accordingly Keep n.p.o. postmidnight-for potential EGD in the morning-will send epic message to GI on-call.

## 2023-07-01 NOTE — Progress Notes (Signed)
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 - remains applicable  GOAL:   Patient will meet greater than or equal to 90% of their needs - goal unmet  MONITOR:   Labs, Weight trends, TF tolerance, I & O's  REASON FOR ASSESSMENT:   Consult Enteral/tube feeding initiation and management  ASSESSMENT:   67 y.o. male with extreme failure to thrive who initially presented 05/04/2023 with a laryngeal mass pressing into the airway. He then went to [facility] [place] and admitted there. He had a PEG tube placed on 05/07/2023 he was treated with antibiotics and Decadron and discharged. Presented back to [facility]/21/2023 with worsening shortness of breath. 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  9/22: admit; s/p trach placement 9/23: TF restarted 9/28: TF stopped 10/1: TF resumed at 20ml  Spoke with pt and his wife at bedside. Pt continues with poor management of thick secretions which leads to episodes of coughing and subsequent emesis. TF was held on 9/28 d/t vomiting. Pt tolerating at 26ml/h today. No n/v/distension.   Admission weights: 09/23: 56.9 kg 10/02: 53.9 kg  Medications: miralax  Labs: BUN 33, CBG's 100-128 x24 hours  Diet Order:   Diet Order             Diet NPO time specified  Diet effective now                   EDUCATION NEEDS:   Education needs have been addressed  Skin:  Skin Assessment: Reviewed RN Assessment  Last BM:  10/2 (type 7 small)  Height:   Ht Readings from Last 1 Encounters:   05/04/23 6\' 4"  (1.93 m)    Weight:   Wt Readings from Last 1 Encounters:  07/01/23 53.9 kg   BMI:  Body mass index is 14.46 kg/m.  Estimated Nutritional Needs:   Kcal:  1920-2300 kcal  Protein:  77-89g  Fluid:  >/=2L  Drusilla Kanner, RDN, LDN Clinical Nutrition

## 2023-07-01 NOTE — Progress Notes (Signed)
Mobility Specialist Progress Note;   07/01/23 1430  Mobility  Activity Ambulated with assistance in hallway  Level of Assistance Contact guard assist, steadying assist  Assistive Device Four wheel walker  Distance Ambulated (ft) 50 ft (x4)  Activity Response Tolerated fair  Mobility Referral Yes  $Mobility charge 1 Mobility  Mobility Specialist Start Time (ACUTE ONLY) 1420  Mobility Specialist Stop Time (ACUTE ONLY) 1455  Mobility Specialist Time Calculation (min) (ACUTE ONLY) 35 min   Pt agreeable to mobility. Required only minG assist throughout ambulation. X4 seated rest breaks taken every ~9ft, for 292ft total. VSS on trach collar 28%, SpO2 >90%, HR 90-103bpm. Pt back in bed with all needs met, alarm on.   Garrett Fleming Mobility Specialist Please contact via SecureChat or Rehab Office [phone]

## 2023-07-01 NOTE — Progress Notes (Addendum)
PROGRESS NOTE        PATIENT DETAILS Name: Garrett Fleming Age: 67 y.o. Sex: male Date of Birth: 1956/02/27 Admit Date: 06/21/2023 Admitting Physician Lorin Glass, MD GEX:[id number], [name], MD  Brief Summary: Patient is a 67 y.o.  male with known history of laryngeal cancer (recent PEG tube insertion-followed at [facility])-presented with respiratory distress-required emergent tracheostomy by ENT.  See below for further details.  Significant events: 9/22>> Admit with emergent Trach by ENT 9/24>> transferred to [facility] 9/27>> tracheostomy changed by ENT  Significant studies: 9/26>> CT chest: Possible aspiration pneumonia, pneumomediastinum/subcutaneous emphysema. 9/26>> CT neck: Obstructing mass in the supraglottic larynx with extension to the inferior aspect of the thyroid cartilage. 9/30>> CT abdomen/pelvis: No acute abnormality  Significant microbiology data: 9/22>> blood culture: No growth 9/22>> influenza/COVID PCR/RSV PCR: Negative  Procedures: 9/22>> tracheotomy by ENT  Consults: ENT PCCM  Subjective: No vomiting-still coughing-discussed with RT-still having thick secretions requiring frequent suctioning.  Objective: Vitals: Blood pressure 106/71, pulse 81, temperature 98.9 F (37.2 C), temperature source Oral, resp. rate 19, weight 53.9 kg, SpO2 93%.   Exam: Gen Exam:Alert awake-not in any distress HEENT:atraumatic, normocephalic Chest: Moving air-transmitted upper airway sounds CVS:S1S2 regular Abdomen:soft non tender, non distended Extremities:no edema Neurology: Non focal Skin: no rash  Pertinent Labs/Radiology:    Latest Ref Rng & Units 07/01/2023    3:55 AM 06/29/2023    3:56 AM 06/28/2023    3:31 PM  CBC  WBC 4.0 - 10.5 K/uL 5.8  5.6  6.2   Hemoglobin 13.0 - 17.0 g/dL 7.5  24.4  9.4   Hematocrit 39.0 - 52.0 % 22.8  31.8  29.6   Platelets 150 - 400 K/uL 283  283  265     Lab Results  Component Value Date   NA  136 07/01/2023   K 3.8 07/01/2023   CL 104 07/01/2023   CO2 26 07/01/2023      Assessment/Plan: Acute hypoxic respiratory failure secondary to obstructing laryngeal mass-known history of laryngeal cancer S/p emergent tracheotomy by ENT on 9/22 Currently stable-on 6 L via trach collar Trach changed by ENT on 9/27  Per spouse-patient has an appointment with ENT at [facility] on 10/12.   Vomiting Occurring after coughing spells CT abdomen nonacute No further vomiting for 48 hours-on tube feeds-slowly increase to goal rate over the next several days.    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  Aspiration pneumonia Although stable-on trach collar  Still bringing up thick secretions requiring frequent suctioning-continue aggressive pulmonary toileting with Mucomyst/hypertonic saline nebs and chest PT.  Completed Unasyn 9/27 Continue to mobilize Difficult situation-essentially homeless-spouse not able to provide enough trach care to follow safe discharge.  Not a candidate for SNF for 30 days-as this is a new tear trach  AKI Hemodynamically mediated Resolved  Normocytic anemia Secondary to underlying malignancy Significant drop in hemoglobin-however no evidence of blood loss Watch closely-if hemoglobin continues to drop-will need workup.  HTN BP stable Amlodipine  Social issues Essentially homeless-primary residence without electricity/water-spouse/patient was living in a hotel before he got hospitalized-currently he does not have the financial means to go back to the hotel until 10/3 when he gets his monthly check. Social worker following.  Nutrition Status: Nutrition Problem: Severe Malnutrition Etiology: chronic illness (laryngeal mass) Signs/Symptoms: severe fat depletion, severe  muscle depletion Interventions: Refer to RD note for recommendations, Tube feeding  Underweight: Estimated body mass index is 14.46 kg/m as  calculated from the following:   Height as of 05/04/23: 6\' 4"  (1.93 m).   Weight as of this encounter: 53.9 kg.   Code status:   Code Status: Full Code   DVT Prophylaxis: enoxaparin (LOVENOX) injection 40 mg Start: 06/22/23 1700 SCDs Start: 06/21/23 1845   Family Communication: Spouse at bedside.   Disposition Plan: Status is: Inpatient Remains inpatient appropriate because: Severity of illness   Planned Discharge Destination:Home when appropriate by [facility] team-after trach change today.   Diet: Diet Order             Diet NPO time specified  Diet effective now                     Antimicrobial agents: Anti-infectives (From admission, onward)    Start     Dose/Rate Route Frequency Ordered Stop   06/21/23 2300  Ampicillin-Sulbactam (UNASYN) 3 g in sodium chloride 0.9 % 100 mL IVPB        3 g 200 mL/hr over 30 Minutes Intravenous Every 6 hours 06/21/23 1857 06/27/23 0659   06/21/23 1700  vancomycin (VANCOREADY) IVPB 1500 mg/300 mL  Status:  Discontinued        1,500 mg 150 mL/hr over 120 Minutes Intravenous  Once 06/21/23 1648 06/22/23 1552   06/21/23 1700  piperacillin-tazobactam (ZOSYN) IVPB 3.375 g        3.375 g 100 mL/hr over 30 Minutes Intravenous  Once 06/21/23 1648 06/21/23 1726        MEDICATIONS: Scheduled Meds:  acetylcysteine  4 mL Nebulization BID   amLODipine  5 mg Per Tube Daily   enoxaparin (LOVENOX) injection  40 mg Subcutaneous Q24H   feeding supplement (PROSource TF20)  60 mL Per Tube Daily   guaiFENesin  15 mL Per Tube BID   ipratropium-albuterol  3 mL Nebulization TID   mouth rinse  15 mL Mouth Rinse 4 times per day   polyethylene glycol  17 g Per Tube Daily   scopolamine  1 patch Transdermal Q72H   sodium chloride HYPERTONIC  4 mL Nebulization BID   Continuous Infusions:  feeding supplement (OSMOLITE 1.5 CAL) 1,000 mL (07/01/23 0334)   PRN Meds:.acetaminophen, albuterol, docusate, hydrALAZINE, ondansetron (ZOFRAN) IV, mouth rinse,  oxyCODONE, polyethylene glycol   I have personally reviewed following labs and imaging studies  LABORATORY DATA: CBC: Recent Labs  Lab 06/27/23 0638 06/28/23 1531 06/29/23 0356 07/01/23 0355  WBC 8.3 6.2 5.6 5.8  HGB 10.4* 9.4* 10.2* 7.5*  HCT 32.6* 29.6* 31.8* 22.8*  MCV 97.3 95.2 93.5 93.8  PLT 247 265 283 283    Basic Metabolic Panel: Recent Labs  Lab 06/27/23 0638 06/28/23 1531 06/29/23 0356 06/30/23 0344 07/01/23 0355  NA 137 138 138  --  136  K 4.3 4.2 3.6  --  3.8  CL 100 97* 97*  --  104  CO2 28 25 28   --  26  GLUCOSE 103* 97 111*  --  109*  BUN 18 25* 24*  --  33*  CREATININE 0.81 0.81 0.86  --  0.79  CALCIUM 8.5* 8.8* 8.8*  --  8.2*  MG  --   --   --  2.1 2.0  PHOS  --   --   --  3.1 3.0    GFR: Estimated Creatinine Clearance: 70.2 mL/min (by C-G formula based on  SCr of 0.79 mg/dL).  Liver Function Tests: No results for input(s): "AST", "ALT", "ALKPHOS", "BILITOT", "PROT", "ALBUMIN" in the last 168 hours.  Recent Labs  Lab 06/28/23 1531  LIPASE 28   No results for input(s): "AMMONIA" in the last 168 hours.  Coagulation Profile: No results for input(s): "INR", "PROTIME" in the last 168 hours.  Cardiac Enzymes: No results for input(s): "CKTOTAL", "CKMB", "CKMBINDEX", "TROPONINI" in the last 168 hours.  BNP (last 3 results) No results for input(s): "PROBNP" in the last 8760 hours.  Lipid Profile: No results for input(s): "CHOL", "HDL", "LDLCALC", "TRIG", "CHOLHDL", "LDLDIRECT" in the last 72 hours.  Thyroid Function Tests: No results for input(s): "TSH", "T4TOTAL", "FREET4", "T3FREE", "THYROIDAB" in the last 72 hours.  Anemia Panel: No results for input(s): "VITAMINB12", "FOLATE", "FERRITIN", "TIBC", "IRON", "RETICCTPCT" in the last 72 hours.  Urine analysis:    Component Value Date/Time   COLORURINE YELLOW 11/17/2018 0144   APPEARANCEUR CLEAR 11/17/2018 0144   LABSPEC >1.046 (H) 11/17/2018 0144   PHURINE 6.0 11/17/2018 0144    GLUCOSEU NEGATIVE 11/17/2018 0144   HGBUR NEGATIVE 11/17/2018 0144   BILIRUBINUR NEGATIVE 11/17/2018 0144   KETONESUR NEGATIVE 11/17/2018 0144   PROTEINUR NEGATIVE 11/17/2018 0144   NITRITE NEGATIVE 11/17/2018 0144   LEUKOCYTESUR NEGATIVE 11/17/2018 0144    Sepsis Labs: Lactic Acid, Venous    Component Value Date/Time   LATICACIDVEN 2.0 (HH) 06/21/2023 1655    MICROBIOLOGY: Recent Results (from the past 240 hour(s))  Resp panel by RT-PCR (RSV, Flu A&B, Covid) Anterior Nasal Swab     Status: None   Collection Time: 06/21/23  4:32 PM   Specimen: Anterior Nasal Swab  Result Value Ref Range Status   SARS Coronavirus 2 by RT PCR NEGATIVE NEGATIVE Final   Influenza A by PCR NEGATIVE NEGATIVE Final   Influenza B by PCR NEGATIVE NEGATIVE Final    Comment: (NOTE) The Xpert Xpress SARS-CoV-2/FLU/RSV plus assay is intended as an aid in the diagnosis of influenza from Nasopharyngeal swab specimens and should not be used as a sole basis for treatment. Nasal washings and aspirates are unacceptable for Xpert Xpress SARS-CoV-2/FLU/RSV testing.  Fact Sheet for Patients: BloggerCourse.com  Fact Sheet for Healthcare Providers: SeriousBroker.it  This test is not yet approved or cleared by the [place] FDA and has been authorized for detection and/or diagnosis of SARS-CoV-2 by FDA under an Emergency Use Authorization (EUA). This EUA will remain in effect (meaning this test can be used) for the duration of the COVID-19 declaration under Section 564(b)(1) of the Act, 21 U.S.C. section 360bbb-3(b)(1), unless the authorization is terminated or revoked.     Resp Syncytial Virus by PCR NEGATIVE NEGATIVE Final    Comment: (NOTE) Fact Sheet for Patients: BloggerCourse.com  Fact Sheet for Healthcare Providers: SeriousBroker.it  This test is not yet approved or cleared by the [place]  FDA and has been authorized for detection and/or diagnosis of SARS-CoV-2 by FDA under an Emergency Use Authorization (EUA). This EUA will remain in effect (meaning this test can be used) for the duration of the COVID-19 declaration under Section 564(b)(1) of the Act, 21 U.S.C. section 360bbb-3(b)(1), unless the authorization is terminated or revoked.  Performed at [facility], [address] [place], [place] [address]   Blood culture (routine x 2)     Status: None   Collection Time: 06/21/23  4:40 PM   Specimen: BLOOD  Result Value Ref Range Status   Specimen Description BLOOD SITE NOT SPECIFIED  Final   Special Requests   Final    BOTTLES DRAWN AEROBIC AND ANAEROBIC Blood Culture results may not be optimal due to an excessive volume of blood received in culture bottles   Culture   Final    NO GROWTH 5 DAYS Performed at [facility], [address] [place], [place] [address]    Report Status 06/26/2023 FINAL  Final  Blood culture (routine x 2)     Status: None   Collection Time: 06/21/23  4:55 PM   Specimen: BLOOD  Result Value Ref Range Status   Specimen Description BLOOD SITE NOT SPECIFIED  Final   Special Requests   Final    BOTTLES DRAWN AEROBIC AND ANAEROBIC Blood Culture adequate volume   Culture   Final    NO GROWTH 5 DAYS Performed at [facility], [address], [place], [place] [address]    Report Status 06/26/2023 FINAL  Final  MRSA Next Gen by PCR, Nasal     Status: None   Collection Time: 06/21/23  8:20 PM   Specimen: Nasal Mucosa; Nasal Swab  Result Value Ref Range Status   MRSA by PCR Next Gen NOT DETECTED NOT DETECTED Final    Comment: (NOTE) The GeneXpert MRSA Assay (FDA approved for NASAL specimens only), is one component of a comprehensive MRSA colonization surveillance program. It is not intended to diagnose MRSA infection nor to guide or monitor treatment for MRSA infections. Test performance is not FDA approved in patients less  than 78 years old. Performed at [facility], [address] [place], [place] [address]     RADIOLOGY STUDIES/RESULTS: CT ABDOMEN PELVIS W CONTRAST  Result Date: 06/29/2023 CLINICAL DATA:  Abdominal pain EXAM: CT ABDOMEN AND PELVIS WITH CONTRAST TECHNIQUE: Multidetector CT imaging of the abdomen and pelvis was performed using the standard protocol following bolus administration of intravenous contrast. RADIATION DOSE REDUCTION: This exam was performed according to the departmental dose-optimization program which includes automated exposure control, adjustment of the mA and/or kV according to patient size and/or use of iterative reconstruction technique. CONTRAST:  75mL OMNIPAQUE IOHEXOL 350 MG/ML SOLN COMPARISON:  06/27/2023 FINDINGS: Lower chest: No acute abnormality. Hepatobiliary: Gallbladder is within normal limits. Simple cyst is noted within the right lobe of the liver. Pancreas: Unremarkable. No pancreatic ductal dilatation or surrounding inflammatory changes. Spleen: Normal in size without focal abnormality. Adrenals/Urinary Tract: Adrenal glands are within normal limits. Kidneys demonstrate a normal enhancement pattern bilaterally. No renal calculi are seen. A few small cysts are noted within the right kidney. No follow-up is recommended. The bladder is well distended. Stomach/Bowel: Gastrostomy catheter is noted within the duodenal bulb. This should withdrawn into the stomach. Mild fluid is noted throughout the colon consistent with a diarrheal state. The appendix is not well visualized and may have been surgically removed. No inflammatory changes to suggest appendicitis are noted. Small bowel shows no obstructive changes. Vascular/Lymphatic: Tortuous aorta is noted. No aneurysmal dilatation is seen. No lymphadenopathy is noted. Reproductive: Prostate is unremarkable. Other: No abdominal wall hernia or abnormality. No abdominopelvic ascites. Musculoskeletal: Degenerative changes of the  lumbar spine are noted. IMPRESSION: Gastrostomy catheter within the duodenal bulb. Fluid within the colon likely related to a diarrheal state. No obstructive changes are seen. No other acute abnormality is noted. Electronically Signed   By: Alcide Clever M.D.   On: 06/29/2023 21:03     LOS: 10 days   Jeoffrey Massed, MD  [facility]  To contact the attending provider between 7A-7P or the covering provider during after hours 7P-7A, please log into the web site [email or web address] and access using universal [place] password for that web site. If you do not have the password, please call the hospital operator.  07/01/2023, 1:24 PM

## 2023-07-01 NOTE — Progress Notes (Signed)
Occupational Therapy Treatment Patient Details Name: Garrett Fleming MRN: [id number] DOB: [birth year] Today's Date: 07/01/2023   History of present illness 67 y.o. male admitted 9/22 with hypoxemic respiratory failure due to airway stenosis with infection s/p emergent awake tracheostomy. PMxh: larynx CA, left ankle fx, HTN, smoker, cocaine abuse, aortic aneurysm, blind Rt eye, PEG   OT comments  Pt refused mobility or ADLs this session, reports being OOB a significant amount with mobility team earlier. Pt spouse under the impression that pt will be leaving from the hospital some time tomorrow or very soon. Discussed with them the potential need for SNF to offer better support of his trach, continued to educate pt spouse on trach care and setup of humidifier on O2 tank just in case. Pt spouse also reports she has been receiving equipment to help prepare with trach care upon DC, perhaps there may be some confusion that led the spouse to believe they were going home soon.       If plan is discharge home, recommend the following:  Assistance with cooking/housework;A little help with bathing/dressing/bathroom   Equipment Recommendations  BSC/3in1    Recommendations for Other Services      Precautions / Restrictions Precautions Precautions: Fall;Other (comment) Precaution Comments: PEG, trach Restrictions Weight Bearing Restrictions: No        ADL either performed or assessed with clinical judgement   ADL           General ADL Comments: Pt declined mobility or ADLs at this time. Reinforced use of communication devices as he reports partially using them. Educated pt spouse on the use 02 tank for portable trach humidifier, pt spouse also assisting with trach suction without any cueing. Discussed with them SNF recommendations and how beneficial it may be upon DC, the spouse was in agreeance but unsure of the pt as he remained a flat affect.      Cognition Arousal: Alert Behavior  During Therapy: Flat affect Overall Cognitive Status: Within Functional Limits for tasks assessed                       Pertinent Vitals/ Pain       Pain Assessment Pain Assessment: Faces Faces Pain Scale: No hurt   Frequency  Min 1X/week        Progress Toward Goals  OT Goals(current goals can now be found in the care plan section)  Progress towards OT goals: Progressing toward goals  Acute Rehab OT Goals Patient Stated Goal: to feel better OT Goal Formulation: With patient/family Time For Goal Achievement: 07/10/23 Potential to Achieve Goals: Good  Plan      Co-evaluation                 AM-PAC OT "6 Clicks" Daily Activity     Outcome Measure   Help from another person eating meals?: Total Help from another person taking care of personal grooming?: A Little Help from another person toileting, which includes using toliet, bedpan, or urinal?: A Little Help from another person bathing (including washing, rinsing, drying)?: A Little Help from another person to put on and taking off regular upper body clothing?: A Little Help from another person to put on and taking off regular lower body clothing?: A Little 6 Click Score: 16    End of Session    OT Visit Diagnosis: Unsteadiness on feet (R26.81);Muscle weakness (generalized) (M62.81);Other (comment)   Activity Tolerance     Patient Left in bed;with call bell/phone  within reach;with family/visitor present;with bed alarm set   Nurse Communication Mobility status;Other (comment) (encourage mobility)        Time: 1610-9604 OT Time Calculation (min): 16 min  Charges: OT General Charges $OT Visit: 1 Visit OT Treatments $Therapeutic Activity: 8-22 mins  07/01/2023  AB, OTR/L  Acute Rehabilitation Services  Office: [phone]   Tristan Schroeder 07/01/2023, 6:46 PM

## 2023-07-01 NOTE — Progress Notes (Signed)
Patient requests to hold off on CPT at this time. Patient wishes to do again at next scheduled time. RT will continue to monitor.

## 2023-07-01 NOTE — Plan of Care (Signed)
  Problem: Education: Goal: Knowledge about tracheostomy care/management will improve Outcome: Progressing   Problem: Activity: Goal: Ability to tolerate increased activity will improve Outcome: Progressing   Problem: Health Behavior/Discharge Planning: Goal: Ability to manage tracheostomy will improve Outcome: Progressing   Problem: Respiratory: Goal: Patent airway maintenance will improve Outcome: Progressing   Problem: Role Relationship: Goal: Ability to communicate will improve Outcome: Progressing   Problem: Education: Goal: Knowledge of General Education information will improve Description: Including pain rating scale, medication(s)/side effects and non-pharmacologic comfort measures Outcome: Progressing   Problem: Health Behavior/Discharge Planning: Goal: Ability to manage health-related needs will improve Outcome: Progressing   Problem: Clinical Measurements: Goal: Will remain free from infection Outcome: Progressing Goal: Diagnostic test results will improve Outcome: Progressing Goal: Respiratory complications will improve Outcome: Progressing   Problem: Activity: Goal: Risk for activity intolerance will decrease Outcome: Progressing   Problem: Nutrition: Goal: Adequate nutrition will be maintained Outcome: Progressing

## 2023-07-02 DIAGNOSIS — R0902 Hypoxemia: Secondary | ICD-10-CM | POA: Diagnosis not present

## 2023-07-02 DIAGNOSIS — E43 Unspecified severe protein-calorie malnutrition: Secondary | ICD-10-CM | POA: Diagnosis not present

## 2023-07-02 DIAGNOSIS — Z430 Encounter for attention to tracheostomy: Secondary | ICD-10-CM | POA: Diagnosis not present

## 2023-07-02 DIAGNOSIS — D62 Acute posthemorrhagic anemia: Secondary | ICD-10-CM | POA: Diagnosis not present

## 2023-07-02 DIAGNOSIS — Z931 Gastrostomy status: Secondary | ICD-10-CM

## 2023-07-02 DIAGNOSIS — K921 Melena: Secondary | ICD-10-CM

## 2023-07-02 DIAGNOSIS — R061 Stridor: Secondary | ICD-10-CM | POA: Diagnosis not present

## 2023-07-02 LAB — BASIC METABOLIC PANEL
Anion gap: 8 (ref 5–15)
BUN: 24 mg/dL — ABNORMAL HIGH (ref 8–23)
CO2: 25 mmol/L (ref 22–32)
Calcium: 8.1 mg/dL — ABNORMAL LOW (ref 8.9–10.3)
Chloride: 104 mmol/L (ref 98–111)
Creatinine, Ser: 0.73 mg/dL (ref 0.61–1.24)
GFR, Estimated: >60 mL/min (ref >60)
Glucose, Bld: 96 mg/dL (ref 70–99)
Potassium: 3.7 mmol/L (ref 3.5–5.1)
Sodium: 137 mmol/L (ref 135–145)

## 2023-07-02 LAB — PREPARE RBC (CROSSMATCH)

## 2023-07-02 LAB — CBC
HCT: 21.1 % — ABNORMAL LOW (ref 39.0–52.0)
Hemoglobin: 7 g/dL — ABNORMAL LOW (ref 13.0–17.0)
MCH: 32 pg (ref 26.0–34.0)
MCHC: 33.2 g/dL (ref 30.0–36.0)
MCV: 96.3 fL (ref 80.0–100.0)
Platelets: 270 10*3/uL (ref 150–400)
RBC: 2.19 MIL/uL — ABNORMAL LOW (ref 4.22–5.81)
RDW: 13.7 % (ref 11.5–15.5)
WBC: 7.3 10*3/uL (ref 4.0–10.5)
nRBC: 0 % (ref 0.0–0.2)

## 2023-07-02 LAB — HEMOGLOBIN AND HEMATOCRIT, BLOOD
HCT: 21.3 % — ABNORMAL LOW (ref 39.0–52.0)
Hemoglobin: 7 g/dL — ABNORMAL LOW (ref 13.0–17.0)

## 2023-07-02 LAB — GLUCOSE, CAPILLARY
Glucose-Capillary: 94 mg/dL (ref 70–99)
Glucose-Capillary: 92 mg/dL (ref 70–99)
Glucose-Capillary: 101 mg/dL — ABNORMAL HIGH (ref 70–99)
Glucose-Capillary: 77 mg/dL (ref 70–99)
Glucose-Capillary: 106 mg/dL — ABNORMAL HIGH (ref 70–99)

## 2023-07-02 LAB — PROTIME-INR
INR: 1.1 (ref 0.8–1.2)
Prothrombin Time: 14.5 s (ref 11.4–15.2)

## 2023-07-02 MED ORDER — SODIUM CHLORIDE 0.9% IV SOLUTION: Freq: Once | INTRAVENOUS | Status: DC

## 2023-07-02 MED ORDER — IPRATROPIUM-ALBUTEROL 0.5-2.5 (3) MG/3ML IN SOLN
3.0000 mL | Freq: Two times a day (BID) | RESPIRATORY_TRACT | Status: DC
  Administered 2023-07-02 – 2023-07-06 (×8): 3 mL via RESPIRATORY_TRACT
  Filled 2023-07-02 (×9): qty 3

## 2023-07-02 MED ORDER — DIPHENHYDRAMINE HCL 50 MG/ML IJ SOLN
12.5000 mg | Freq: Once | INTRAMUSCULAR | Status: AC
  Administered 2023-07-02: 12.5 mg via INTRAVENOUS
  Filled 2023-07-02: qty 1

## 2023-07-02 MED ORDER — ACETAMINOPHEN 325 MG PO TABS
650.0000 mg | ORAL_TABLET | Freq: Once | ORAL | Status: AC
  Administered 2023-07-02: 650 mg
  Filled 2023-07-02: qty 2
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
## 2023-07-02 NOTE — Plan of Care (Signed)
  Problem: Education: Goal: Knowledge about tracheostomy care/management will improve Outcome: Progressing   Problem: Health Behavior/Discharge Planning: Goal: Ability to manage tracheostomy will improve Outcome: Progressing   Problem: Respiratory: Goal: Patent airway maintenance will improve Outcome: Progressing   Problem: Role Relationship: Goal: Ability to communicate will improve Outcome: Progressing   Problem: Education: Goal: Knowledge of General Education information will improve Description: Including pain rating scale, medication(s)/side effects and non-pharmacologic comfort measures Outcome: Progressing   Problem: Clinical Measurements: Goal: Will remain free from infection Outcome: Progressing Goal: Diagnostic test results will improve Outcome: Progressing Goal: Respiratory complications will improve Outcome: Progressing   Problem: Activity: Goal: Risk for activity intolerance will decrease Outcome: Progressing

## 2023-07-02 NOTE — Progress Notes (Addendum)
PROGRESS NOTE        PATIENT DETAILS Name: Garrett Fleming Age: 67 y.o. Sex: male Date of Birth: May 05, 1956 Admit Date: 06/21/2023 Admitting Physician Lorin Glass, MD [id number], [name], MD  Brief Summary: Patient is a 67 y.o.  male with known history of laryngeal cancer (recent PEG tube insertion-followed at [facility])-presented with respiratory distress-required emergent tracheostomy by ENT.  See below for further details.  Significant events: 9/22>> Admit with emergent Trach by ENT 9/24>> transferred to [facility] 9/27>> tracheostomy changed by ENT 10/2>> black stools-PPI started-GI consulted.  Significant studies: 9/26>> CT chest: Possible aspiration pneumonia, pneumomediastinum/subcutaneous emphysema. 9/26>> CT neck: Obstructing mass in the supraglottic larynx with extension to the inferior aspect of the thyroid cartilage. 9/30>> CT abdomen/pelvis: No acute abnormality  Significant microbiology data: 9/22>> blood culture: No growth 9/22>> influenza/COVID PCR/RSV PCR: Negative  Procedures: 9/22>> tracheotomy by ENT  Consults: ENT PCCM  Subjective: Had black tarry stools yesterday evening-followed by another small black bowel movement overnight.  Objective: Vitals: Blood pressure (!) 103/51, pulse 72, temperature 99.5 F (37.5 C), temperature source Oral, resp. rate 15, weight 53.9 kg, SpO2 95%.   Exam: Gen Exam:Alert awake-not in any distress HEENT:atraumatic, normocephalic Chest: Hardly any transmitted upper airway sounds. CVS:S1S2 regular Abdomen:soft non tender, non distended Extremities:no edema Neurology: Non focal Skin: no rash  Pertinent Labs/Radiology:    Latest Ref Rng & Units 07/02/2023    4:00 AM 07/01/2023    6:58 PM 07/01/2023    3:55 AM  CBC  WBC 4.0 - 10.5 K/uL 7.3  6.6  5.8   Hemoglobin 13.0 - 17.0 g/dL 7.0  8.2  7.5   Hematocrit 39.0 - 52.0 % 21.1  25.6  22.8   Platelets 150 - 400 K/uL 270  291  283      Lab Results  Component Value Date   NA 137 07/02/2023   K 3.7 07/02/2023   CL 104 07/02/2023   CO2 25 07/02/2023      Assessment/Plan: Acute hypoxic respiratory failure secondary to obstructing laryngeal mass-known history of laryngeal cancer S/p emergent tracheotomy by ENT on 9/22 Trach changed by ENT on 9/27  Hospital course complicated by thick secretions-likely related to laryngeal cancer-on aggressive pulmonary toileting/Mucomyst/hypertonic nebs/chest PT  Difficult situation-patient essentially homeless-spouse not able to provide enough trach care to allow for safe discharge-appreciate ENT input on 10/3-spoke with Dr. Mackey [facility] on 10/3-explained concern that given social situation/and issues with secretion-patient may not be able to keep outpatient appointment (scheduled for 10/12) or follow-up for laryngectomy date.  ENT at [facility] hesitant to take patient as a transfer for several weeks without having a definitive laryngectomy date-however upon further discussion, we agreed that once a laryngectomy date was posted-[facility] could accept the patient 2-3 days prior as a transfer.  I have left my cell number with Dr Laverda Sorenson, he will either contact Dr. Ernestene Kiel or me-once we know when laryngectomy can be performed.    Per spouse-patient has an appointment with ENT at [facility] on 10/12.  Will reach out to ENT at [facility] today  Vomiting Occurring after coughing spells CT abdomen nonacute No further vomiting for 72 hours-on tube feeds-slowly increase to goal rate over the next several days.    Upper GI bleeding with acute blood loss anemia Melanotic stools started  on 10/2 Several days of vomiting before that-?  Mallory-Weiss tear PPI infusion 1 unit of PRBC transfusion on 10/3-consent obtained this morning GI consulted-possible EGD later today.  Dysphagia Secondary to laryngeal cancer Tube feeds-see  above.  Aspiration pneumonia Although stable-on trach collar  Completed Unasyn 9/27 Continue to mobilize-pulmonary tolerating-see above Difficult situation-essentially homeless-spouse not able to provide enough trach care to follow safe discharge.  Not a candidate for SNF for 30 days-as this is a new tear trach  AKI Hemodynamically mediated Resolved  Normocytic anemia Secondary to underlying malignancy Significant drop in hemoglobin-however no evidence of blood loss Watch closely-if hemoglobin continues to drop-will need workup.  HTN BP stable Amlodipine  Social issues Essentially homeless-currently in between appartments (had to leave their long standing rental due to landlords death)-spouse is looking for a place-they get the Social Security checks on 10/3-in the interim-they have been living in a [employer].  See above concerns regarding laryngeal cancer-and difficulty in getting to [facility]. [facility] team following.  Nutrition Status: Nutrition Problem: Severe Malnutrition Etiology: chronic illness (laryngeal mass) Signs/Symptoms: severe fat depletion, severe muscle depletion Interventions: Refer to RD note for recommendations, Tube feeding  Underweight: Estimated body mass index is 14.46 kg/m as calculated from the following:   Height as of 05/04/23: 6\' 4"  (1.93 m).   Weight as of this encounter: 53.9 kg.   Code status:   Code Status: Full Code   DVT Prophylaxis: SCDs Start: 06/21/23 1845   Family Communication: Spouse at bedside.   Disposition Plan: Status is: Inpatient Remains inpatient appropriate because: Severity of illness   Planned Discharge Destination:Home when appropriate by [facility] team-after trach change today.   Diet: Diet Order             Diet NPO time specified  Diet effective now                     Antimicrobial agents: Anti-infectives (From admission, onward)    Start     Dose/Rate Route Frequency Ordered Stop    06/21/23 2300  Ampicillin-Sulbactam (UNASYN) 3 g in sodium chloride 0.9 % 100 mL IVPB        3 g 200 mL/hr over 30 Minutes Intravenous Every 6 hours 06/21/23 1857 06/27/23 0659   06/21/23 1700  vancomycin (VANCOREADY) IVPB 1500 mg/300 mL  Status:  Discontinued        1,500 mg 150 mL/hr over 120 Minutes Intravenous  Once 06/21/23 1648 06/22/23 1552   06/21/23 1700  piperacillin-tazobactam (ZOSYN) IVPB 3.375 g        3.375 g 100 mL/hr over 30 Minutes Intravenous  Once 06/21/23 1648 06/21/23 1726        MEDICATIONS: Scheduled Meds:  sodium chloride   Intravenous Once   acetaminophen  650 mg Per Tube Once   acetylcysteine  4 mL Nebulization BID   amLODipine  5 mg Per Tube Daily   diphenhydrAMINE  12.5 mg Intravenous Once   feeding supplement (PROSource TF20)  60 mL Per Tube Daily   guaiFENesin  15 mL Per Tube BID   ipratropium-albuterol  3 mL Nebulization BID   mouth rinse  15 mL Mouth Rinse 4 times per day   [START ON 07/05/2023] pantoprazole  40 mg Intravenous Q12H   polyethylene glycol  17 g Per Tube Daily   scopolamine  1 patch Transdermal Q72H   Continuous Infusions:  sodium chloride 75 mL/hr at 07/01/23 1935   feeding supplement (OSMOLITE 1.5 CAL)  Stopped (07/01/23 2349)   pantoprazole 8 mg/hr (07/02/23 0544)   PRN Meds:.acetaminophen, albuterol, docusate, hydrALAZINE, ondansetron (ZOFRAN) IV, mouth rinse, oxyCODONE, polyethylene glycol   I have personally reviewed following labs and imaging studies  LABORATORY DATA: CBC: Recent Labs  Lab 06/28/23 1531 06/29/23 0356 07/01/23 0355 07/01/23 1858 07/02/23 0400  WBC 6.2 5.6 5.8 6.6 7.3  HGB 9.4* 10.2* 7.5* 8.2* 7.0*  HCT 29.6* 31.8* 22.8* 25.6* 21.1*  MCV 95.2 93.5 93.8 98.1 96.3  PLT 265 283 283 291 270    Basic Metabolic Panel: Recent Labs  Lab 06/27/23 0638 06/28/23 1531 06/29/23 0356 06/30/23 0344 07/01/23 0355 07/02/23 0400  NA 137 138 138  --  136 137  K 4.3 4.2 3.6  --  3.8 3.7  CL 100 97* 97*  --   104 104  CO2 28 25 28   --  26 25  GLUCOSE 103* 97 111*  --  109* 96  BUN 18 25* 24*  --  33* 24*  CREATININE 0.81 0.81 0.86  --  0.79 0.73  CALCIUM 8.5* 8.8* 8.8*  --  8.2* 8.1*  MG  --   --   --  2.1 2.0  --   PHOS  --   --   --  3.1 3.0  --     GFR: Estimated Creatinine Clearance: 70.2 mL/min (by C-G formula based on SCr of 0.73 mg/dL).  Liver Function Tests: No results for input(s): "AST", "ALT", "ALKPHOS", "BILITOT", "PROT", "ALBUMIN" in the last 168 hours.  Recent Labs  Lab 06/28/23 1531  LIPASE 28   No results for input(s): "AMMONIA" in the last 168 hours.  Coagulation Profile: Recent Labs  Lab 07/02/23 0400  INR 1.1    Cardiac Enzymes: No results for input(s): "CKTOTAL", "CKMB", "CKMBINDEX", "TROPONINI" in the last 168 hours.  BNP (last 3 results) No results for input(s): "PROBNP" in the last 8760 hours.  Lipid Profile: No results for input(s): "CHOL", "HDL", "LDLCALC", "TRIG", "CHOLHDL", "LDLDIRECT" in the last 72 hours.  Thyroid Function Tests: No results for input(s): "TSH", "T4TOTAL", "FREET4", "T3FREE", "THYROIDAB" in the last 72 hours.  Anemia Panel: No results for input(s): "VITAMINB12", "FOLATE", "FERRITIN", "TIBC", "IRON", "RETICCTPCT" in the last 72 hours.  Urine analysis:    Component Value Date/Time   COLORURINE YELLOW 11/17/2018 0144   APPEARANCEUR CLEAR 11/17/2018 0144   LABSPEC >1.046 (H) 11/17/2018 0144   PHURINE 6.0 11/17/2018 0144   GLUCOSEU NEGATIVE 11/17/2018 0144   HGBUR NEGATIVE 11/17/2018 0144   BILIRUBINUR NEGATIVE 11/17/2018 0144   KETONESUR NEGATIVE 11/17/2018 0144   PROTEINUR NEGATIVE 11/17/2018 0144   NITRITE NEGATIVE 11/17/2018 0144   LEUKOCYTESUR NEGATIVE 11/17/2018 0144    Sepsis Labs: Lactic Acid, Venous    Component Value Date/Time   LATICACIDVEN 2.0 (HH) 06/21/2023 1655    MICROBIOLOGY: No results found for this or any previous visit (from the past 240 hour(s)).   RADIOLOGY STUDIES/RESULTS: No results  found.   LOS: 11 days   Jeoffrey Massed, MD  [facility]    To contact the attending provider between 7A-7P or the covering provider during after hours 7P-7A, please log into the web site [email or web address] and access using universal [place] password for that web site. If you do not have the password, please call the hospital operator.  07/02/2023, 11:05 AM

## 2023-07-02 NOTE — Progress Notes (Addendum)
ENT BRIEF UPDATE NOTE  Discussed case with Head and Neck Surgery team (Dr. Laverda Sorenson / Dr. Smith Mince) at [facility]. Patient may be eligible for inpatient transfer to oncology service at [facility] in preparation for laryngectomy in light of very challenging social situation.  Will notify medicine team.  Electronically signed by:  Scarlette Ar, MD  Staff Physician Facial Plastic & Reconstructive Surgery Otolaryngology - Head and Neck Surgery [facility] [place]

## 2023-07-02 NOTE — Progress Notes (Signed)
OT Cancellation Note  Patient Details Name: Garrett Fleming MRN: [id number] DOB: 19-Apr-1956   Cancelled Treatment:    Reason Eval/Treat Not Completed: Patient not medically ready (Pt receiving blood, hemoglobin low. OT will continue to follow up with pt as able.)  07/02/2023  AB, OTR/L  Acute Rehabilitation Services  Office: [phone]   Tristan Schroeder 07/02/2023, 4:26 PM

## 2023-07-02 NOTE — Progress Notes (Signed)
TRH night cross cover note:   I was notified by RN that this patient, who is admitted with suspected acute upper gastrointestinal bleed, and underwent transfusion of 1 unit PRBC on the morning of 07/02/2023 with hemoglobin of 7.0 at the time, has updated hemoglobin level of 7.0.  He is scheduled to undergo EGD in the morning.  No new symptoms reported at this time.  Most recent blood pressure 98/66 with a MAP of 75 mmHg, with corresponding heart rates in the 60s.  He has had no additional melena, per my discussions with his RN.  Given no significant improvement in his hemoglobin level after transfusion of 1 unit of PRBC in the setting of suspected presenting acute upper gastrointestinal bleed, I will order transfusion of 1 additional unit PRBC at this time, along with order for repeat H&H check to occur after completion of transfusion of this 1 unit PRBC.    Newton Pigg, DO Hospitalist

## 2023-07-03 ENCOUNTER — Encounter (HOSPITAL_COMMUNITY): Payer: Self-pay | Admitting: Internal Medicine

## 2023-07-03 ENCOUNTER — Inpatient Hospital Stay (HOSPITAL_COMMUNITY): Payer: Medicare HMO | Admitting: Registered Nurse

## 2023-07-03 ENCOUNTER — Encounter (HOSPITAL_COMMUNITY)
Admission: EM | Disposition: A | Discharge status: Other Acute Inpatient Hospital | Payer: Self-pay | Source: Home / Self Care | Attending: Internal Medicine

## 2023-07-03 DIAGNOSIS — K209 Esophagitis, unspecified without bleeding: Secondary | ICD-10-CM

## 2023-07-03 DIAGNOSIS — I2510 Atherosclerotic heart disease of native coronary artery without angina pectoris: Secondary | ICD-10-CM

## 2023-07-03 DIAGNOSIS — K449 Diaphragmatic hernia without obstruction or gangrene: Secondary | ICD-10-CM

## 2023-07-03 DIAGNOSIS — Z430 Encounter for attention to tracheostomy: Secondary | ICD-10-CM | POA: Diagnosis not present

## 2023-07-03 DIAGNOSIS — E43 Unspecified severe protein-calorie malnutrition: Secondary | ICD-10-CM | POA: Diagnosis not present

## 2023-07-03 DIAGNOSIS — K269 Duodenal ulcer, unspecified as acute or chronic, without hemorrhage or perforation: Secondary | ICD-10-CM | POA: Diagnosis not present

## 2023-07-03 DIAGNOSIS — R0902 Hypoxemia: Secondary | ICD-10-CM | POA: Diagnosis not present

## 2023-07-03 DIAGNOSIS — R061 Stridor: Secondary | ICD-10-CM | POA: Diagnosis not present

## 2023-07-03 DIAGNOSIS — K2100 Gastro-esophageal reflux disease with esophagitis, without bleeding: Secondary | ICD-10-CM

## 2023-07-03 HISTORY — PX: BIOPSY: SHX5522

## 2023-07-03 HISTORY — PX: ESOPHAGOGASTRODUODENOSCOPY (EGD) WITH PROPOFOL: SHX5813

## 2023-07-03 LAB — BPAM RBC
Blood Product Expiration Date: 202410072359
Blood Product Expiration Date: 202410082359
ISSUE DATE / TIME: 202410031323
ISSUE DATE / TIME: 202410032239
Unit Type and Rh: 9500
Unit Type and Rh: 9500

## 2023-07-03 LAB — TYPE AND SCREEN
ABO/RH(D): O NEG
Antibody Screen: NEGATIVE
Unit division: 0
Unit division: 0

## 2023-07-03 LAB — CBC
HCT: 23.3 % — ABNORMAL LOW (ref 39.0–52.0)
Hemoglobin: 7.8 g/dL — ABNORMAL LOW (ref 13.0–17.0)
MCH: 31.8 pg (ref 26.0–34.0)
MCHC: 33.5 g/dL (ref 30.0–36.0)
MCV: 95.1 fL (ref 80.0–100.0)
Platelets: 233 10*3/uL (ref 150–400)
RBC: 2.45 MIL/uL — ABNORMAL LOW (ref 4.22–5.81)
RDW: 15.4 % (ref 11.5–15.5)
WBC: 9.3 10*3/uL (ref 4.0–10.5)
nRBC: 0 % (ref 0.0–0.2)

## 2023-07-03 LAB — GLUCOSE, CAPILLARY
Glucose-Capillary: 106 mg/dL — ABNORMAL HIGH (ref 70–99)
Glucose-Capillary: 114 mg/dL — ABNORMAL HIGH (ref 70–99)
Glucose-Capillary: 69 mg/dL — ABNORMAL LOW (ref 70–99)
Glucose-Capillary: 76 mg/dL (ref 70–99)
Glucose-Capillary: 82 mg/dL (ref 70–99)
Glucose-Capillary: 95 mg/dL (ref 70–99)

## 2023-07-03 LAB — BASIC METABOLIC PANEL
Anion gap: 7 (ref 5–15)
BUN: 16 mg/dL (ref 8–23)
CO2: 25 mmol/L (ref 22–32)
Calcium: 7.9 mg/dL — ABNORMAL LOW (ref 8.9–10.3)
Chloride: 102 mmol/L (ref 98–111)
Creatinine, Ser: 0.83 mg/dL (ref 0.61–1.24)
GFR, Estimated: >60 mL/min (ref >60)
Glucose, Bld: 95 mg/dL (ref 70–99)
Potassium: 3.7 mmol/L (ref 3.5–5.1)
Sodium: 134 mmol/L — ABNORMAL LOW (ref 135–145)

## 2023-07-03 SURGERY — ESOPHAGOGASTRODUODENOSCOPY (EGD) WITH PROPOFOL
Anesthesia: Monitor Anesthesia Care

## 2023-07-03 MED ORDER — GLYCOPYRROLATE PF 0.2 MG/ML IJ SOSY
PREFILLED_SYRINGE | INTRAMUSCULAR | Status: DC | PRN
  Administered 2023-07-03: .1 mg via INTRAVENOUS

## 2023-07-03 MED ORDER — PROPOFOL 10 MG/ML IV BOLUS
INTRAVENOUS | Status: DC | PRN
  Administered 2023-07-03: 85 ug/kg/min via INTRAVENOUS
  Administered 2023-07-03: 40 mg via INTRAVENOUS

## 2023-07-03 MED ORDER — LIDOCAINE 2% (20 MG/ML) 5 ML SYRINGE
INTRAMUSCULAR | Status: DC | PRN
  Administered 2023-07-03: 100 mg via INTRAVENOUS

## 2023-07-03 MED ORDER — LACTATED RINGERS IV SOLN: INTRAVENOUS | Status: DC

## 2023-07-03 MED ORDER — PHENYLEPHRINE HCL-NACL 20-0.9 MG/250ML-% IV SOLN
INTRAVENOUS | Status: DC | PRN
Start: 2023-07-03 — End: 2023-07-03
  Administered 2023-07-03: 40 ug/min via INTRAVENOUS

## 2023-07-03 SURGICAL SUPPLY — 15 items

## 2023-07-03 NOTE — Progress Notes (Signed)
PT Cancellation Note  Patient Details Name: Garrett Fleming MRN: [id number] DOB: [birth year]   Cancelled Treatment:    Reason Eval/Treat Not Completed: Patient at procedure or test/unavailable (PT will continue with attempts. Patient off the floor for procedure.)  Donna Bernard, PT, MPT  Ina Homes 07/03/2023, 10:51 AM

## 2023-07-03 NOTE — Transfer of Care (Signed)
Immediate Anesthesia Transfer of Care Note  Patient: Garrett Fleming  Procedure(s) Performed: ESOPHAGOGASTRODUODENOSCOPY (EGD) WITH PROPOFOL BIOPSY  Patient Location: PACU  Anesthesia Type:MAC  Level of Consciousness: drowsy and patient cooperative  Airway & Oxygen Therapy: Patient connected to tracheostomy mask oxygen  Post-op Assessment: Report given to RN and Post -op Vital signs reviewed and stable  Post vital signs: Reviewed and stable  Last Vitals:  Vitals Value Taken Time  BP 100/60   Temp    Pulse 84 07/03/23 1059  Resp 18 07/03/23 1059  SpO2 100 % 07/03/23 1059  Vitals shown include unfiled device data.  Last Pain:  Vitals:   07/03/23 0927  TempSrc: Temporal  PainSc: 0-No pain      Patients Stated Pain Goal: 0 (06/23/23 2151)  Complications: There were no known notable events for this encounter.

## 2023-07-03 NOTE — Progress Notes (Signed)
Patient refusing CPT at this time. He wanted to wait. Patient was able to cough up a lot of secretions on his own.

## 2023-07-03 NOTE — Plan of Care (Signed)
  Problem: Education: Goal: Knowledge about tracheostomy care/management will improve Outcome: Progressing   Problem: Health Behavior/Discharge Planning: Goal: Ability to manage tracheostomy will improve Outcome: Progressing   Problem: Respiratory: Goal: Patent airway maintenance will improve Outcome: Progressing   Problem: Role Relationship: Goal: Ability to communicate will improve Outcome: Progressing   Problem: Education: Goal: Knowledge of General Education information will improve Description: Including pain rating scale, medication(s)/side effects and non-pharmacologic comfort measures Outcome: Progressing   Problem: Clinical Measurements: Goal: Ability to maintain clinical measurements within normal limits will improve Outcome: Progressing Goal: Respiratory complications will improve Outcome: Progressing Goal: Cardiovascular complication will be avoided Outcome: Progressing   Problem: Activity: Goal: Risk for activity intolerance will decrease Outcome: Progressing   Problem: Nutrition: Goal: Adequate nutrition will be maintained Outcome: Progressing   Problem: Coping: Goal: Level of anxiety will decrease Outcome: Progressing   Problem: Elimination: Goal: Will not experience complications related to bowel motility Outcome: Progressing Goal: Will not experience complications related to urinary retention Outcome: Progressing

## 2023-07-03 NOTE — Interval H&P Note (Signed)
History and Physical Interval Note:  H/H 7/21 last evening and received another unit RBCs with repeat H/H 7.8/23 this morning.  No overt bleeding.  Plan to proceed with EGD for diagnostic and therapeutic intent.  07/03/2023 10:17 AM  Garrett Fleming  has presented today for surgery, with the diagnosis of anemia, black stool.  The various methods of treatment have been discussed with the patient and family. After consideration of risks, benefits and other options for treatment, the patient has consented to  Procedure(s): ESOPHAGOGASTRODUODENOSCOPY (EGD) WITH PROPOFOL (N/A) as a surgical intervention.  The patient's history has been reviewed, patient examined, no change in status, stable for surgery.  I have reviewed the patient's chart and labs.  Questions were answered to the patient's satisfaction.     Verlin Dike Kena Limon

## 2023-07-03 NOTE — Anesthesia Preprocedure Evaluation (Addendum)
Anesthesia Evaluation  Patient identified by MRN, date of birth, ID band Patient awake  General Assessment Comment:Pt in respiratory distress from upper airway obstruction.  Reviewed: Allergy & Precautions, NPO status , Patient's Chart, lab work & pertinent test results  Airway Mallampati: III       Dental  (+) Poor Dentition, Missing, Dental Advisory Given   Pulmonary shortness of breath, Current Smoker and Patient abstained from smoking.    + decreased breath sounds(-) wheezing      Cardiovascular hypertension, + CAD and + Peripheral Vascular Disease   Rhythm:Regular Rate:Normal  Echo 2020  1. The left ventricle has normal systolic function, with an ejection fraction of 55-60%. The cavity size was normal. Left ventricular diastolic parameters were normal.   2. The right ventricle has normal systolic function. The cavity was normal. There is no increase in right ventricular wall thickness.   3. Left atrial size was moderately dilated.   4. The mitral valve is normal in structure. Mild thickening of the mitral valve leaflet.   5. The tricuspid valve is normal in structure.   6. The aortic valve is tricuspid Mild thickening of the aortic valve Sclerosis without any evidence of stenosis of the aortic valve.   7. The pulmonic valve was normal in structure.   8. There is mild dilatation of the aortic root measuring 39 mm.     Neuro/Psych negative neurological ROS     GI/Hepatic negative GI ROS, Neg liver ROS,,,(+)     substance abuse  cocaine use  Endo/Other  negative endocrine ROS    Renal/GU negative Renal ROS     Musculoskeletal negative musculoskeletal ROS (+) Arthritis ,    Abdominal   Peds  Hematology  (+) Blood dyscrasia, anemia   Anesthesia Other Findings   Reproductive/Obstetrics                             Anesthesia Physical Anesthesia Plan  ASA: 4  Anesthesia Plan: MAC    Post-op Pain Management: Minimal or no pain anticipated   Induction: Intravenous  PONV Risk Score and Plan: 0 and Treatment may vary due to age or medical condition, Propofol infusion and TIVA  Airway Management Planned: Simple Face Mask  Additional Equipment:   Intra-op Plan:   Post-operative Plan:   Informed Consent: I have reviewed the patients History and Physical, chart, labs and discussed the procedure including the risks, benefits and alternatives for the proposed anesthesia with the patient or authorized representative who has indicated his/her understanding and acceptance.     Dental advisory given  Plan Discussed with: CRNA  Anesthesia Plan Comments:        Anesthesia Quick Evaluation

## 2023-07-03 NOTE — Progress Notes (Signed)
Physical Therapy Treatment Patient Details Name: Garrett Fleming MRN: [id number] DOB: Oct 20, 1955 Today's Date: 07/03/2023   History of Present Illness 67 y.o. male admitted 9/22 with hypoxemic respiratory failure due to airway stenosis with infection s/p emergent awake tracheostomy. PMxh: larynx CA, left ankle fx, HTN, smoker, cocaine abuse, aortic aneurysm, blind Rt eye, PEG    PT Comments  Patient is agreeable to PT. He reports feeling fatigued from procedure earlier today. Patient was able to stand with CGA using 4 wheeled walker. Therapeutic exercises performed in sitting for strengthening. Educated patient on pacing and energy conservation. Vitals stable with activity. Recommend to continue PT to maximize independence and decrease caregiver burden.    If plan is discharge home, recommend the following: Assistance with cooking/housework;A little help with bathing/dressing/bathroom;A little help with walking and/or transfers   Can travel by private vehicle     Yes  Equipment Recommendations  Rollator (4 wheels)    Recommendations for Other Services       Precautions / Restrictions Precautions Precautions: Fall;Other (comment) Precaution Comments: PEG, trach Restrictions Weight Bearing Restrictions: No     Mobility  Bed Mobility Overal bed mobility: Modified Independent                  Transfers Overall transfer level: Needs assistance Equipment used: Rollator (4 wheels) Transfers: Sit to/from Stand Sit to Stand: Contact guard assist           General transfer comment: cues for safety and technique for standing    Ambulation/Gait             Pre-gait activities: patient able to take several side steps along edge of bed with CGA using rollator. further ambulation attempted deferred as patient feeling fatigued from procedure earlier today     Stairs             Wheelchair Mobility     Tilt Bed    Modified Rankin (Stroke Patients Only)        Balance Overall balance assessment: Needs assistance Sitting-balance support: Feet unsupported Sitting balance-Leahy Scale: Good Sitting balance - Comments: patient maintained sitting balance with reaching outside base of support and while performing LE exericses                                    Cognition Arousal: Alert Behavior During Therapy: Flat affect Overall Cognitive Status: Within Functional Limits for tasks assessed                                          Exercises General Exercises - Lower Extremity Long Arc Quad: AROM, Strengthening, Both, 10 reps, Seated Hip Flexion/Marching: AROM, Strengthening, Both, 10 reps, Seated Other Exercises Other Exercises: cues for exercise technique for strengthening    General Comments General comments (skin integrity, edema, etc.): vitals stable throughout session with Sp02 in the 90's with activity. educated patient on pacing and energy conservation strategies for endurance.      Pertinent Vitals/Pain Pain Assessment Pain Assessment: No/denies pain    Home Living                          Prior Function            PT Goals (current goals can now be found in  the care plan section) Acute Rehab PT Goals Patient Stated Goal: return home PT Goal Formulation: With patient/family Time For Goal Achievement: 07/06/23 Potential to Achieve Goals: Good Progress towards PT goals: Progressing toward goals    Frequency    Min 1X/week      PT Plan      Co-evaluation              AM-PAC PT "6 Clicks" Mobility   Outcome Measure  Help needed turning from your back to your side while in a flat bed without using bedrails?: None Help needed moving from lying on your back to sitting on the side of a flat bed without using bedrails?: None Help needed moving to and from a bed to a chair (including a wheelchair)?: A Little Help needed standing up from a chair using your arms  (e.g., wheelchair or bedside chair)?: A Little Help needed to walk in hospital room?: A Little Help needed climbing 3-5 steps with a railing? : A Little 6 Click Score: 20    End of Session Equipment Utilized During Treatment: Oxygen Activity Tolerance: Patient tolerated treatment well;Patient limited by fatigue Patient left: in bed;with call bell/phone within reach;with bed alarm set   PT Visit Diagnosis: Other abnormalities of gait and mobility (R26.89);Muscle weakness (generalized) (M62.81)     Time: 1610-9604 PT Time Calculation (min) (ACUTE ONLY): 13 min  Charges:    $Therapeutic Activity: 8-22 mins PT General Charges $$ ACUTE PT VISIT: 1 Visit                     Donna Bernard, PT, MPT    Ina Homes 07/03/2023, 2:35 PM

## 2023-07-03 NOTE — Plan of Care (Signed)
  Problem: Education: Goal: Knowledge about tracheostomy care/management will improve Outcome: Progressing   Problem: Activity: Goal: Ability to tolerate increased activity will improve Outcome: Progressing   Problem: Health Behavior/Discharge Planning: Goal: Ability to manage tracheostomy will improve Outcome: Progressing   Problem: Respiratory: Goal: Patent airway maintenance will improve Outcome: Progressing   Problem: Role Relationship: Goal: Ability to communicate will improve Outcome: Progressing   Problem: Education: Goal: Knowledge of General Education information will improve Description: Including pain rating scale, medication(s)/side effects and non-pharmacologic comfort measures Outcome: Progressing   Problem: Health Behavior/Discharge Planning: Goal: Ability to manage health-related needs will improve Outcome: Progressing   Problem: Clinical Measurements: Goal: Ability to maintain clinical measurements within normal limits will improve Outcome: Progressing Goal: Will remain free from infection Outcome: Progressing Goal: Diagnostic test results will improve Outcome: Progressing Goal: Respiratory complications will improve Outcome: Progressing Goal: Cardiovascular complication will be avoided Outcome: Progressing   Problem: Activity: Goal: Risk for activity intolerance will decrease Outcome: Progressing   Problem: Nutrition: Goal: Adequate nutrition will be maintained Outcome: Progressing   Problem: Coping: Goal: Level of anxiety will decrease Outcome: Progressing   Problem: Elimination: Goal: Will not experience complications related to bowel motility Outcome: Progressing Goal: Will not experience complications related to urinary retention Outcome: Progressing   Problem: Pain Managment: Goal: General experience of comfort will improve Outcome: Progressing   Problem: Safety: Goal: Ability to remain free from injury will improve Outcome:  Progressing   Problem: Skin Integrity: Goal: Risk for impaired skin integrity will decrease Outcome: Progressing   

## 2023-07-03 NOTE — Progress Notes (Addendum)
PROGRESS NOTE        PATIENT DETAILS Name: Garrett Fleming Age: 67 y.o. Sex: male Date of Birth: Sep 18, 1956 Admit Date: 06/21/2023 Admitting Physician Lorin Glass, MD YSA:[id number], [name], MD  Brief Summary: Patient is a 67 y.o.  male with known history of laryngeal cancer (recent PEG tube insertion-followed at [facility])-presented with respiratory distress-required emergent tracheostomy by ENT.  Further Hospital course complicated by excessive secretions from trachea-likely related to laryngeal mass requiring frequent suctioning/pulmonary tolerating/chest PT-and upper GI bleeding due to acute blood loss anemia.  See below for further details.  Significant events: 9/22>> Admit with emergent Trach by ENT 9/24>> transferred to [facility] 9/27>> tracheostomy changed by ENT 10/2>> black stools-PPI started-GI consulted. 10/4>>EGD: grade D esophagitis, nonbleeding duodenal ulcer  Significant studies: 9/26>> CT chest: Possible aspiration pneumonia, pneumomediastinum/subcutaneous emphysema. 9/26>> CT neck: Obstructing mass in the supraglottic larynx with extension to the inferior aspect of the thyroid cartilage. 9/30>> CT abdomen/pelvis: No acute abnormality  Significant microbiology data: 9/22>> blood culture: No growth 9/22>> influenza/COVID PCR/RSV PCR: Negative  Procedures: 9/22>> tracheotomy by ENT 10/4>>EGD: grade D esophagitis, nonbleeding duodenal ulcer  Consults: ENT PCCM  Subjective: No major issues overnight-no further melanotic stools overnight.  Objective: Vitals: Blood pressure 103/72, pulse 72, temperature 97.8 F (36.6 C), temperature source Oral, resp. rate 20, height 6\' 4"  (1.93 m), weight 61.4 kg, SpO2 99%.   Exam: Gen Exam:Alert awake-not in any distress HEENT:atraumatic, normocephalic Chest: B/L clear to auscultation anteriorly-significant transmitted upper airway sounds. CVS:S1S2 regular Abdomen:soft non tender, non  distended Extremities:no edema Neurology: Non focal Skin: no rash  Pertinent Labs/Radiology:    Latest Ref Rng & Units 07/03/2023    5:31 AM 07/02/2023    8:30 PM 07/02/2023    4:00 AM  CBC  WBC 4.0 - 10.5 K/uL 9.3   7.3   Hemoglobin 13.0 - 17.0 g/dL 7.8  7.0  7.0   Hematocrit 39.0 - 52.0 % 23.3  21.3  21.1   Platelets 150 - 400 K/uL 233   270     Lab Results  Component Value Date   NA 134 (L) 07/03/2023   K 3.7 07/03/2023   CL 102 07/03/2023   CO2 25 07/03/2023      Assessment/Plan: Acute hypoxic respiratory failure secondary to obstructing laryngeal mass-known history of laryngeal cancer S/p emergent tracheotomy by ENT on 9/22 Trach changed by ENT on 9/27  Hospital course complicated by thick secretions-likely related to laryngeal cancer-on aggressive pulmonary toileting/Mucomyst/hypertonic nebs/chest PT  Difficult situation-patient essentially homeless-spouse not able to provide enough trach care to allow for safe discharge-appreciate ENT input on 10/3-spoke with Dr. Mackey Birchwood [facility] on 10/3-explained concern that given social situation/and issues with secretion-patient may not be able to keep outpatient appointment (scheduled for 10/12) or follow-up for laryngectomy date.  ENT at [facility] hesitant to take patient as a transfer for several weeks without having a definitive laryngectomy date-however upon further discussion, we agreed that once a laryngectomy date was posted-[facility] could accept the patient 2-3 days prior as a transfer.  I have left my cell number with Dr Laverda Sorenson, he will either contact Dr. Ernestene Kiel or me-once we know when laryngectomy can be performed.    Per spouse-patient has an appointment with ENT at [facility] on 10/12.  Will reach out to ENT  at [facility] today  Vomiting Occurring after coughing spells CT abdomen nonacute No further vomiting for 4 days-on tube feeds-slowly increase to goal rate over  the next several days.    Upper GI bleeding with acute blood loss anemia Melanotic stools started on 10/2 EGD-with duodenal ulcers but per GI-likely bleeding from esophagitis PPI twice daily x 8 weeks recommended Awaiting pathology. Hb stable after 2 units of PRBC.    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  Aspiration pneumonia Although stable-on trach collar  Completed Unasyn 9/27 Continue to mobilize-pulmonary tolerating-see above  Difficult situation-essentially homeless-spouse not able to provide enough trach care to follow safe discharge.  Not a candidate for SNF for 30 days-as this is a new tear trach  AKI Hemodynamically mediated Resolved  Normocytic anemia Secondary to underlying malignancy and acute blood loss from GI bleeding Hb stable after 2 units of PRBC-plan is to follow closely.  HTN BP stable Amlodipine  Social issues Essentially homeless-currently in between appartments (had to leave their long standing rental due to landlords death)-spouse is looking for a place-they get the Social Security checks on 10/3-in the interim-they have been living in a [employer].  See above concerns regarding laryngeal cancer-and difficulty in getting to [facility]. [facility] team following.  Nutrition Status: Nutrition Problem: Severe Malnutrition Etiology: chronic illness (laryngeal mass) Signs/Symptoms: severe fat depletion, severe muscle depletion Interventions: Refer to RD note for recommendations, Tube feeding  Underweight: Estimated body mass index is 16.48 kg/m as calculated from the following:   Height as of this encounter: 6\' 4"  (1.93 m).   Weight as of this encounter: 61.4 kg.   Code status:   Code Status: Full Code   DVT Prophylaxis: SCDs Start: 06/21/23 1845   Family Communication: Spouse at bedside.   Disposition Plan: Status is: Inpatient Remains inpatient appropriate because: Severity of illness   Planned  Discharge Destination:SNF   Diet: Diet Order             Diet NPO time specified  Diet effective now                     Antimicrobial agents: Anti-infectives (From admission, onward)    Start     Dose/Rate Route Frequency Ordered Stop   06/21/23 2300  Ampicillin-Sulbactam (UNASYN) 3 g in sodium chloride 0.9 % 100 mL IVPB        3 g 200 mL/hr over 30 Minutes Intravenous Every 6 hours 06/21/23 1857 06/27/23 0659   06/21/23 1700  vancomycin (VANCOREADY) IVPB 1500 mg/300 mL  Status:  Discontinued        1,500 mg 150 mL/hr over 120 Minutes Intravenous  Once 06/21/23 1648 06/22/23 1552   06/21/23 1700  piperacillin-tazobactam (ZOSYN) IVPB 3.375 g        3.375 g 100 mL/hr over 30 Minutes Intravenous  Once 06/21/23 1648 06/21/23 1726        MEDICATIONS: Scheduled Meds:  sodium chloride   Intravenous Once   sodium chloride   Intravenous Once   acetylcysteine  4 mL Nebulization BID   amLODipine  5 mg Per Tube Daily   feeding supplement (PROSource TF20)  60 mL Per Tube Daily   guaiFENesin  15 mL Per Tube BID   ipratropium-albuterol  3 mL Nebulization BID   mouth rinse  15 mL Mouth Rinse 4 times per day   [START ON 07/05/2023] pantoprazole  40 mg Intravenous Q12H   polyethylene glycol  17 g  Per Tube Daily   scopolamine  1 patch Transdermal Q72H   Continuous Infusions:  sodium chloride 75 mL/hr at 07/03/23 0625   feeding supplement (OSMOLITE 1.5 CAL) Stopped (07/01/23 2349)   pantoprazole 8 mg/hr (07/03/23 1111)   PRN Meds:.acetaminophen, albuterol, docusate, hydrALAZINE, ondansetron (ZOFRAN) IV, mouth rinse, oxyCODONE, polyethylene glycol   I have personally reviewed following labs and imaging studies  LABORATORY DATA: CBC: Recent Labs  Lab 06/29/23 0356 07/01/23 0355 07/01/23 1858 07/02/23 0400 07/02/23 2030 07/03/23 0531  WBC 5.6 5.8 6.6 7.3  --  9.3  HGB 10.2* 7.5* 8.2* 7.0* 7.0* 7.8*  HCT 31.8* 22.8* 25.6* 21.1* 21.3* 23.3*  MCV 93.5 93.8 98.1 96.3  --   95.1  PLT 283 283 291 270  --  233    Basic Metabolic Panel: Recent Labs  Lab 06/28/23 1531 06/29/23 0356 06/30/23 0344 07/01/23 0355 07/02/23 0400 07/03/23 0531  NA 138 138  --  136 137 134*  K 4.2 3.6  --  3.8 3.7 3.7  CL 97* 97*  --  104 104 102  CO2 25 28  --  26 25 25   GLUCOSE 97 111*  --  109* 96 95  BUN 25* 24*  --  33* 24* 16  CREATININE 0.81 0.86  --  0.79 0.73 0.83  CALCIUM 8.8* 8.8*  --  8.2* 8.1* 7.9*  MG  --   --  2.1 2.0  --   --   PHOS  --   --  3.1 3.0  --   --     GFR: Estimated Creatinine Clearance: 77.1 mL/min (by C-G formula based on SCr of 0.83 mg/dL).  Liver Function Tests: No results for input(s): "AST", "ALT", "ALKPHOS", "BILITOT", "PROT", "ALBUMIN" in the last 168 hours.  Recent Labs  Lab 06/28/23 1531  LIPASE 28   No results for input(s): "AMMONIA" in the last 168 hours.  Coagulation Profile: Recent Labs  Lab 07/02/23 0400  INR 1.1    Cardiac Enzymes: No results for input(s): "CKTOTAL", "CKMB", "CKMBINDEX", "TROPONINI" in the last 168 hours.  BNP (last 3 results) No results for input(s): "PROBNP" in the last 8760 hours.  Lipid Profile: No results for input(s): "CHOL", "HDL", "LDLCALC", "TRIG", "CHOLHDL", "LDLDIRECT" in the last 72 hours.  Thyroid Function Tests: No results for input(s): "TSH", "T4TOTAL", "FREET4", "T3FREE", "THYROIDAB" in the last 72 hours.  Anemia Panel: No results for input(s): "VITAMINB12", "FOLATE", "FERRITIN", "TIBC", "IRON", "RETICCTPCT" in the last 72 hours.  Urine analysis:    Component Value Date/Time   COLORURINE YELLOW 11/17/2018 0144   APPEARANCEUR CLEAR 11/17/2018 0144   LABSPEC >1.046 (H) 11/17/2018 0144   PHURINE 6.0 11/17/2018 0144   GLUCOSEU NEGATIVE 11/17/2018 0144   HGBUR NEGATIVE 11/17/2018 0144   BILIRUBINUR NEGATIVE 11/17/2018 0144   KETONESUR NEGATIVE 11/17/2018 0144   PROTEINUR NEGATIVE 11/17/2018 0144   NITRITE NEGATIVE 11/17/2018 0144   LEUKOCYTESUR NEGATIVE 11/17/2018 0144     Sepsis Labs: Lactic Acid, Venous    Component Value Date/Time   LATICACIDVEN 2.0 (HH) 06/21/2023 1655    MICROBIOLOGY: No results found for this or any previous visit (from the past 240 hour(s)).   RADIOLOGY STUDIES/RESULTS: No results found.   LOS: 12 days   Jeoffrey Massed, MD  [facility]    To contact the attending provider between 7A-7P or the covering provider during after hours 7P-7A, please log into the web site [email or web address] and access using universal [place] password for that web site. If you do  not have the password, please call the hospital operator.  07/03/2023, 12:58 PM

## 2023-07-03 NOTE — Anesthesia Postprocedure Evaluation (Signed)
Anesthesia Post Note  Patient: Garrett Fleming  Procedure(s) Performed: ESOPHAGOGASTRODUODENOSCOPY (EGD) WITH PROPOFOL BIOPSY     Patient location during evaluation: PACU Anesthesia Type: MAC Level of consciousness: awake and alert Pain management: pain level controlled Vital Signs Assessment: post-procedure vital signs reviewed and stable Respiratory status: spontaneous breathing Cardiovascular status: stable Anesthetic complications: no   There were no known notable events for this encounter.  Last Vitals:  Vitals:   07/03/23 1200 07/03/23 1500  BP:    Pulse: 72 64  Resp: 20 18  Temp:    SpO2:      Last Pain:  Vitals:   07/03/23 1129  TempSrc: Oral  PainSc:                  Lewie Loron

## 2023-07-03 NOTE — Op Note (Signed)
[facility] Patient Name: Garrett Fleming Procedure Date : 07/03/2023 MRN: [id number] Attending MD: Doristine Locks , MD, [id number] Date of Birth: 07-05-1956 CSN: [id number] Age: 67 Admit Type: Inpatient Procedure:                Upper GI endoscopy Indications:              Acute post hemorrhagic anemia, Melena Providers:                Doristine Locks, MD, Jacquelyn "Jaci" Clelia Croft, RN,                            Stephens Shire RN, RN, Marja Kays, Technician Referring MD:              Medicines:                Monitored Anesthesia Care Complications:            No immediate complications. Estimated Blood Loss:     Estimated blood loss was minimal. Procedure:                Pre-Anesthesia Assessment:                           - Prior to the procedure, a History and Physical                            was performed, and patient medications and                            allergies were reviewed. The patient's tolerance of                            previous anesthesia was also reviewed. The risks                            and benefits of the procedure and the sedation                            options and risks were discussed with the patient.                            All questions were answered, and informed consent                            was obtained. Prior Anticoagulants: The patient has                            taken no anticoagulant or antiplatelet agents. ASA                            Grade Assessment: IV - A patient with severe                            systemic disease that is a constant threat to life.  After reviewing the risks and benefits, the patient                            was deemed in satisfactory condition to undergo the                            procedure.                           After obtaining informed consent, the endoscope was                            passed under direct vision. Throughout the                             procedure, the patient's blood pressure, pulse, and                            oxygen saturations were monitored continuously. The                            [id number]-[id number] ([id number]) Olympus endoscope was introduced                            through the mouth, and advanced to the third part                            of duodenum. The upper GI endoscopy was                            accomplished without difficulty. The patient                            tolerated the procedure well. Scope In: Scope Out: Findings:      LA Grade D (one or more mucosal breaks involving at least 75% of       esophageal circumference) esophagitis with no bleeding was found in the       lower third of the esophagus.      A 4 cm hiatal hernia was present.      There was evidence of an intact gastrostomy with a patent G-tube present       in the gastric body. This was loose with the internal bumper 2-3 cm into       the gastric lumen. The base appeared normal without any ulceration. The       external bumper was cinched down and placed back in appropriate position.      Normal mucosa was found in the entire examined stomach.      Three non-bleeding cratered duodenal ulcers with no stigmata of bleeding       were found in the duodenal bulb. The largest lesion was 4 mm in largest       dimension. Biopsies were taken with a cold forceps for histology.       Estimated blood loss was minimal.      The second portion of the duodenum and third portion of the duodenum  were normal. Impression:               - LA Grade D esophagitis with no bleeding.                           - 4 cm hiatal hernia.                           - Intact gastrostomy with a patent G-tube present                            characterized by healthy appearing mucosa.                           - Normal mucosa was found in the entire stomach.                           - Non-bleeding duodenal ulcers with no stigmata of                             bleeding. Biopsied.                           - Normal second portion of the duodenum and third                            portion of the duodenum. Recommendation:           - Return patient to hospital ward for ongoing care.                           - Ok to resume tube feeds today.                           - Use Protonix (pantoprazole) 40 mg IV BID today,                            then can transition to 40 mg tablets BID via PEG                            tube tomorrow. Continue BID dosing for 8 weeks,                            then can reduce to daily dosing.                           - Await pathology results.                           - Continue serial CBC checks with additional blood                            products as needed per protocol.                           -  Inpatient GI service will sign off at this time.                            Please do not hesitate to contact us with                            additional questions or concerns. Procedure Code(s):        --- Professional ---                           [phone], Esophagogastroduodenoscopy, flexible,                            transoral; with biopsy, single or multiple Diagnosis Code(s):        --- Professional ---                           K20.90, Esophagitis, unspecified without bleeding                           K44.9, Diaphragmatic hernia without obstruction or                            gangrene                           Z93.1, Gastrostomy status                           K26.9, Duodenal ulcer, unspecified as acute or                            chronic, without hemorrhage or perforation                           D62, Acute posthemorrhagic anemia                           K92.1, Melena (includes Hematochezia) CPT copyright 2022 American [employer] Association. All rights reserved. The codes documented in this report are preliminary and upon coder review may  be revised to meet current compliance  requirements. Doristine Locks, MD 07/03/2023 11:04:36 AM Number of Addenda: 0

## 2023-07-03 NOTE — Progress Notes (Signed)
Nutrition Follow-up  DOCUMENTATION CODES:   Severe malnutrition in context of chronic illness  INTERVENTION:  Continue; Continuous feeding; Osmolite 1.5 @ 71ml/hr(1440ml/day), Prosource TF20 60 ml daily Provides; 2240kcal, 110 g protein, free water   NUTRITION DIAGNOSIS:   Severe Malnutrition related to chronic illness (laryngeal mass) as evidenced by severe fat depletion, severe muscle depletion.    GOAL:   Patient will meet greater than or equal to 90% of their needs    MONITOR:   Labs, Weight trends, TF tolerance, I & O's  REASON FOR ASSESSMENT:   Consult Enteral/tube feeding initiation and management  ASSESSMENT:   67 y.o. male with extreme failure to thrive who initially presented 05/04/2023 with a laryngeal mass pressing into the airway. He then went to [facility] in [place] and admitted there. He had a PEG tube placed on 05/07/2023 he was treated with antibiotics and Decadron and discharged. Presented back to [facility] 06/21/2023 with worsening shortness of breath. 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 Per rounds MD reports EGD revealed non bleeding duodenal ulcer. Discussed restarting feedings at 83ml/hr advance 10ml every 24 hours goal 60 ml/hr 9/22: admit; s/p trach placement 9/23: TF restarted 9/28: TF stopped 10/1: TF resumed at 20ml 10/3:TF stopped EGD (10/4) 10/4: TF started 58ml/hr   NUTRITION - FOCUSED PHYSICAL EXAM:  Flowsheet Row Most Recent Value  Orbital Region Severe depletion  Upper Arm Region Severe depletion  Thoracic and Lumbar Region Severe depletion  Buccal Region Severe depletion  Temple Region Severe depletion  Clavicle Bone Region Severe depletion  Clavicle and Acromion Bone Region Severe depletion  Scapular Bone Region Unable to assess  Dorsal Hand Severe depletion  Patellar Region Severe depletion  Anterior Thigh Region Severe depletion  Posterior Calf Region Severe depletion  Edema (RD  Assessment) None  Hair Reviewed  Eyes Reviewed  Mouth Reviewed  Skin Reviewed  Nails Reviewed       Diet Order:   Diet Order             Diet NPO time specified  Diet effective now                   EDUCATION NEEDS:   Education needs have been addressed  Skin:  Skin Assessment: Reviewed RN Assessment  Last BM:  10/2 (type 7 small)  Height:   Ht Readings from Last 1 Encounters:  07/03/23 6\' 4"  (1.93 m)    Weight:   Wt Readings from Last 1 Encounters:  07/03/23 61.4 kg    Ideal Body Weight:     BMI:  Body mass index is 16.48 kg/m.  Estimated Nutritional Needs:   Kcal:  1920-2300 kcal  Protein:  77-89g  Fluid:  >/=2L    Jamelle Haring RDN, LDN Clinical Dietitian  RDN pager # available on Amion

## 2023-07-04 DIAGNOSIS — Z430 Encounter for attention to tracheostomy: Secondary | ICD-10-CM | POA: Diagnosis not present

## 2023-07-04 LAB — BASIC METABOLIC PANEL
Anion gap: 7 (ref 5–15)
BUN: 12 mg/dL (ref 8–23)
CO2: 25 mmol/L (ref 22–32)
Calcium: 7.8 mg/dL — ABNORMAL LOW (ref 8.9–10.3)
Chloride: 101 mmol/L (ref 98–111)
Creatinine, Ser: 0.96 mg/dL (ref 0.61–1.24)
GFR, Estimated: >60 mL/min (ref >60)
Glucose, Bld: 133 mg/dL — ABNORMAL HIGH (ref 70–99)
Potassium: 3.6 mmol/L (ref 3.5–5.1)
Sodium: 133 mmol/L — ABNORMAL LOW (ref 135–145)

## 2023-07-04 LAB — CBC WITH DIFFERENTIAL/PLATELET
Abs Immature Granulocytes: 0.03 10*3/uL (ref 0.00–0.07)
Basophils Absolute: 0 10*3/uL (ref 0.0–0.1)
Basophils Relative: 0 %
Eosinophils Absolute: 0 10*3/uL (ref 0.0–0.5)
Eosinophils Relative: 0 %
HCT: 22.8 % — ABNORMAL LOW (ref 39.0–52.0)
Hemoglobin: 7.6 g/dL — ABNORMAL LOW (ref 13.0–17.0)
Immature Granulocytes: 0 %
Lymphocytes Relative: 10 %
Lymphs Abs: 0.8 10*3/uL (ref 0.7–4.0)
MCH: 31.8 pg (ref 26.0–34.0)
MCHC: 33.3 g/dL (ref 30.0–36.0)
MCV: 95.4 fL (ref 80.0–100.0)
Monocytes Absolute: 0.5 10*3/uL (ref 0.1–1.0)
Monocytes Relative: 6 %
Neutro Abs: 6.4 10*3/uL (ref 1.7–7.7)
Neutrophils Relative %: 84 %
Platelets: 263 10*3/uL (ref 150–400)
RBC: 2.39 MIL/uL — ABNORMAL LOW (ref 4.22–5.81)
RDW: 15.1 % (ref 11.5–15.5)
WBC: 7.8 10*3/uL (ref 4.0–10.5)
nRBC: 0 % (ref 0.0–0.2)

## 2023-07-04 LAB — MAGNESIUM: Magnesium: 1.7 mg/dL (ref 1.7–2.4)

## 2023-07-04 LAB — GLUCOSE, CAPILLARY
Glucose-Capillary: 113 mg/dL — ABNORMAL HIGH (ref 70–99)
Glucose-Capillary: 114 mg/dL — ABNORMAL HIGH (ref 70–99)
Glucose-Capillary: 123 mg/dL — ABNORMAL HIGH (ref 70–99)
Glucose-Capillary: 135 mg/dL — ABNORMAL HIGH (ref 70–99)
Glucose-Capillary: 135 mg/dL — ABNORMAL HIGH (ref 70–99)
Glucose-Capillary: 137 mg/dL — ABNORMAL HIGH (ref 70–99)

## 2023-07-04 LAB — BRAIN NATRIURETIC PEPTIDE: B Natriuretic Peptide: 94.8 pg/mL (ref 0.0–100.0)

## 2023-07-04 LAB — C-REACTIVE PROTEIN: CRP: 5.9 mg/dL — ABNORMAL HIGH (ref <1.0)

## 2023-07-04 NOTE — Progress Notes (Signed)
PROGRESS NOTE        PATIENT DETAILS Name: Garrett Fleming Age: 67 y.o. Sex: male Date of Birth: 06-25-1956 Admit Date: 06/21/2023 Admitting Physician Lorin Glass, MD OZH:[id number], [name], MD  Brief Summary: Patient is a 67 y.o.  male with known history of laryngeal cancer (recent PEG tube insertion-followed at [facility])-presented with respiratory distress-required emergent tracheostomy by ENT.  Further Hospital course complicated by excessive secretions from trachea-likely related to laryngeal mass requiring frequent suctioning/pulmonary tolerating/chest PT-and upper GI bleeding due to acute blood loss anemia.  See below for further details.  Significant events: 9/22>> Admit with emergent Trach by ENT 9/24>> transferred to [facility] 9/27>> tracheostomy changed by ENT 10/2>> black stools-PPI started-GI consulted. 10/4>>EGD: grade D esophagitis, nonbleeding duodenal ulcer  Significant studies: 9/26>> CT chest: Possible aspiration pneumonia, pneumomediastinum/subcutaneous emphysema. 9/26>> CT neck: Obstructing mass in the supraglottic larynx with extension to the inferior aspect of the thyroid cartilage. 9/30>> CT abdomen/pelvis: No acute abnormality  Significant microbiology data: 9/22>> blood culture: No growth 9/22>> influenza/COVID PCR/RSV PCR: Negative  Procedures: 9/22>> tracheotomy by ENT 10/4>>EGD: grade D esophagitis, nonbleeding duodenal ulcer  Consults: ENT PCCM  Subjective:  Patient in bed, appears comfortable, denies any headache, no fever, no chest pain or pressure, no shortness of breath , no abdominal pain. No new focal weakness.   Objective: Vitals: Blood pressure 119/67, pulse 67, temperature 97.8 F (36.6 C), temperature source Oral, resp. rate 18, height 6\' 4"  (1.93 m), weight 61.4 kg, SpO2 94%.   Exam:  Awake Alert, No new F.N deficits, Normal affect Rake.AT,PERRAL, wearing trach collar, some mild secretions noted  around the tracheostomy site, PEG tube in place Supple Neck, No JVD,   Symmetrical Chest wall movement, Good air movement bilaterally, CTAB RRR,No Gallops, Rubs or new Murmurs,  +ve B.Sounds, Abd Soft, No tenderness,   No Cyanosis, Clubbing or edema    Assessment/Plan:  Acute hypoxic respiratory failure secondary to obstructing laryngeal mass-known history of laryngeal cancer S/p emergent tracheotomy by ENT on 9/22 Trach changed by ENT on 9/27  Hospital course complicated by thick secretions-likely related to laryngeal cancer-on aggressive pulmonary toileting/Mucomyst/hypertonic nebs/chest PT  Difficult situation-patient essentially homeless-spouse not able to provide enough trach care to allow for safe discharge-appreciate ENT input on 10/3-spoke with Dr. Mackey [facility] on 10/3-explained concern that given social situation/and issues with secretion-patient may not be able to keep outpatient appointment (scheduled for 10/12) or follow-up for laryngectomy date.  ENT at [facility] hesitant to take patient as a transfer for several weeks without having a definitive laryngectomy date-however upon further discussion, we agreed that once a laryngectomy date was posted-[facility] could accept the patient 2-3 days prior as a transfer.  I have left my cell number with Dr Laverda Sorenson, he will either contact Dr. Ernestene Kiel or me-once we know when laryngectomy can be performed.    Per spouse-patient has an appointment with ENT at [facility] on 10/12.  Will reach out to ENT at [facility] again on Tuesday - case has been discussed with [facility] ENT team by previous MD on 07/03/2023.  Vomiting Occurring after coughing spells CT abdomen nonacute No further vomiting for 4 days-on tube feeds-slowly increase to goal rate over the next several days.    Upper GI bleeding with acute blood loss anemia Melanotic stools started on  10/2 EGD-with duodenal ulcers but per  GI-likely bleeding from esophagitis PPI twice daily x 8 weeks recommended Awaiting pathology. Hb stable after 2 units of PRBC.    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  Aspiration pneumonia Although stable-on trach collar  Completed Unasyn 9/27 Continue to mobilize-pulmonary tolerating-see above  Difficult situation-essentially homeless-spouse not able to provide enough trach care to follow safe discharge.  Not a candidate for SNF for 30 days-as this is a new tear trach  AKI Hemodynamically mediated Resolved  Normocytic anemia Secondary to underlying malignancy and acute blood loss from GI bleeding Hb stable after 2 units of PRBC-plan is to follow closely.  HTN BP stable Amlodipine  Social issues Essentially homeless-currently in between appartments (had to leave their long standing rental due to landlords death)-spouse is looking for a place-they get the Social Security checks on 10/3-in the interim-they have been living in a [employer].  See above concerns regarding laryngeal cancer-and difficulty in getting to [facility]. [facility] team following.  Nutrition Status: Nutrition Problem: Severe Malnutrition Etiology: chronic illness (laryngeal mass) Signs/Symptoms: severe fat depletion, severe muscle depletion Interventions: Refer to RD note for recommendations, Tube feeding  Underweight: Estimated body mass index is 16.48 kg/m as calculated from the following:   Height as of this encounter: 6\' 4"  (1.93 m).   Weight as of this encounter: 61.4 kg.   Code status:   Code Status: Full Code   DVT Prophylaxis: SCDs Start: 06/21/23 1845   Family Communication: Spouse at bedside.   Disposition Plan: Status is: Inpatient Remains inpatient appropriate because: Severity of illness   Planned Discharge Destination:SNF   Diet: Diet Order             Diet NPO time specified  Diet effective now                      Antimicrobial agents: Anti-infectives (From admission, onward)    Start     Dose/Rate Route Frequency Ordered Stop   06/21/23 2300  Ampicillin-Sulbactam (UNASYN) 3 g in sodium chloride 0.9 % 100 mL IVPB        3 g 200 mL/hr over 30 Minutes Intravenous Every 6 hours 06/21/23 1857 06/27/23 0659   06/21/23 1700  vancomycin (VANCOREADY) IVPB 1500 mg/300 mL  Status:  Discontinued        1,500 mg 150 mL/hr over 120 Minutes Intravenous  Once 06/21/23 1648 06/22/23 1552   06/21/23 1700  piperacillin-tazobactam (ZOSYN) IVPB 3.375 g        3.375 g 100 mL/hr over 30 Minutes Intravenous  Once 06/21/23 1648 06/21/23 1726        MEDICATIONS: Scheduled Meds:  sodium chloride   Intravenous Once   sodium chloride   Intravenous Once   amLODipine  5 mg Per Tube Daily   feeding supplement (PROSource TF20)  60 mL Per Tube Daily   guaiFENesin  15 mL Per Tube BID   ipratropium-albuterol  3 mL Nebulization BID   mouth rinse  15 mL Mouth Rinse 4 times per day   [START ON 07/05/2023] pantoprazole  40 mg Intravenous Q12H   polyethylene glycol  17 g Per Tube Daily   scopolamine  1 patch Transdermal Q72H   Continuous Infusions:  feeding supplement (OSMOLITE 1.5 CAL) Stopped (07/01/23 2349)   pantoprazole 8 mg/hr (07/03/23 1436)   PRN Meds:.acetaminophen, albuterol, docusate, hydrALAZINE, ondansetron (ZOFRAN) IV, mouth rinse, oxyCODONE, polyethylene glycol   I have personally reviewed  following labs and imaging studies  LABORATORY DATA:  Recent Labs  Lab 07/01/23 0355 07/01/23 1858 07/02/23 0400 07/02/23 2030 07/03/23 0531 07/04/23 0722  WBC 5.8 6.6 7.3  --  9.3 7.8  HGB 7.5* 8.2* 7.0* 7.0* 7.8* 7.6*  HCT 22.8* 25.6* 21.1* 21.3* 23.3* 22.8*  PLT 283 291 270  --  233 263  MCV 93.8 98.1 96.3  --  95.1 95.4  MCH 30.9 31.4 32.0  --  31.8 31.8  MCHC 32.9 32.0 33.2  --  33.5 33.3  RDW 13.2 13.5 13.7  --  15.4 15.1  LYMPHSABS  --   --   --   --   --  0.8  MONOABS  --   --   --   --   --   0.5  EOSABS  --   --   --   --   --  0.0  BASOSABS  --   --   --   --   --  0.0    Recent Labs  Lab 06/28/23 1531 06/29/23 0356 06/30/23 0344 07/01/23 0355 07/02/23 0400 07/03/23 0531  NA 138 138  --  136 137 134*  K 4.2 3.6  --  3.8 3.7 3.7  CL 97* 97*  --  104 104 102  CO2 25 28  --  26 25 25   ANIONGAP 16* 13  --  6 8 7   GLUCOSE 97 111*  --  109* 96 95  BUN 25* 24*  --  33* 24* 16  CREATININE 0.81 0.86  --  0.79 0.73 0.83  INR  --   --   --   --  1.1  --   MG  --   --  2.1 2.0  --   --   CALCIUM 8.8* 8.8*  --  8.2* 8.1* 7.9*    Lab Results  Component Value Date   CHOL 168 03/20/2020   HDL 80 03/20/2020   LDLCALC 58 03/20/2020   TRIG 189 (H) 03/20/2020   CHOLHDL 2.1 03/20/2020      Recent Labs  Lab 06/28/23 1531 06/29/23 0356 06/30/23 0344 07/01/23 0355 07/02/23 0400 07/03/23 0531  INR  --   --   --   --  1.1  --   MG  --   --  2.1 2.0  --   --   CALCIUM 8.8* 8.8*  --  8.2* 8.1* 7.9*    No results for input(s): "TSH", "T4TOTAL", "FREET4", "T3FREE", "THYROIDAB" in the last 72 hours.  No results for input(s): "VITAMINB12", "FOLATE", "FERRITIN", "TIBC", "IRON", "RETICCTPCT" in the last 72 hours.      RADIOLOGY STUDIES/RESULTS: No results found.   LOS: 13 days   Signature  -    Susa Raring M.D on 07/04/2023 at 8:22 AM   -  To page go to [email or web address]

## 2023-07-04 NOTE — Plan of Care (Signed)
  Problem: Education: Goal: Knowledge about tracheostomy care/management will improve Outcome: Progressing   Problem: Activity: Goal: Ability to tolerate increased activity will improve Outcome: Progressing   Problem: Health Behavior/Discharge Planning: Goal: Ability to manage tracheostomy will improve Outcome: Progressing   Problem: Respiratory: Goal: Patent airway maintenance will improve Outcome: Progressing   Problem: Role Relationship: Goal: Ability to communicate will improve Outcome: Progressing   Problem: Education: Goal: Knowledge of General Education information will improve Description: Including pain rating scale, medication(s)/side effects and non-pharmacologic comfort measures Outcome: Progressing   Problem: Health Behavior/Discharge Planning: Goal: Ability to manage health-related needs will improve Outcome: Progressing   Problem: Clinical Measurements: Goal: Ability to maintain clinical measurements within normal limits will improve Outcome: Progressing

## 2023-07-04 NOTE — Plan of Care (Signed)
  Problem: Education: Goal: Knowledge about tracheostomy care/management will improve Outcome: Progressing   Problem: Respiratory: Goal: Patent airway maintenance will improve Outcome: Progressing   Problem: Role Relationship: Goal: Ability to communicate will improve Outcome: Progressing   Problem: Clinical Measurements: Goal: Ability to maintain clinical measurements within normal limits will improve Outcome: Progressing

## 2023-07-05 ENCOUNTER — Inpatient Hospital Stay (HOSPITAL_COMMUNITY): Payer: Medicare HMO

## 2023-07-05 DIAGNOSIS — Z430 Encounter for attention to tracheostomy: Secondary | ICD-10-CM | POA: Diagnosis not present

## 2023-07-05 LAB — CBC WITH DIFFERENTIAL/PLATELET
Abs Immature Granulocytes: 0.04 10*3/uL (ref 0.00–0.07)
Basophils Absolute: 0 10*3/uL (ref 0.0–0.1)
Basophils Relative: 0 %
Eosinophils Absolute: 0.1 10*3/uL (ref 0.0–0.5)
Eosinophils Relative: 1 %
HCT: 22.5 % — ABNORMAL LOW (ref 39.0–52.0)
Hemoglobin: 7.3 g/dL — ABNORMAL LOW (ref 13.0–17.0)
Immature Granulocytes: 1 %
Lymphocytes Relative: 14 %
Lymphs Abs: 1 10*3/uL (ref 0.7–4.0)
MCH: 30.9 pg (ref 26.0–34.0)
MCHC: 32.4 g/dL (ref 30.0–36.0)
MCV: 95.3 fL (ref 80.0–100.0)
Monocytes Absolute: 0.6 10*3/uL (ref 0.1–1.0)
Monocytes Relative: 8 %
Neutro Abs: 5.1 10*3/uL (ref 1.7–7.7)
Neutrophils Relative %: 76 %
Platelets: 267 10*3/uL (ref 150–400)
RBC: 2.36 MIL/uL — ABNORMAL LOW (ref 4.22–5.81)
RDW: 15.2 % (ref 11.5–15.5)
WBC: 6.8 10*3/uL (ref 4.0–10.5)
nRBC: 0 % (ref 0.0–0.2)

## 2023-07-05 LAB — BASIC METABOLIC PANEL
Anion gap: 8 (ref 5–15)
BUN: 10 mg/dL (ref 8–23)
CO2: 26 mmol/L (ref 22–32)
Calcium: 7.9 mg/dL — ABNORMAL LOW (ref 8.9–10.3)
Chloride: 100 mmol/L (ref 98–111)
Creatinine, Ser: 0.71 mg/dL (ref 0.61–1.24)
GFR, Estimated: >60 mL/min (ref >60)
Glucose, Bld: 132 mg/dL — ABNORMAL HIGH (ref 70–99)
Potassium: 3.8 mmol/L (ref 3.5–5.1)
Sodium: 134 mmol/L — ABNORMAL LOW (ref 135–145)

## 2023-07-05 LAB — C-REACTIVE PROTEIN: CRP: 4 mg/dL — ABNORMAL HIGH (ref <1.0)

## 2023-07-05 LAB — MAGNESIUM: Magnesium: 1.8 mg/dL (ref 1.7–2.4)

## 2023-07-05 LAB — GLUCOSE, CAPILLARY
Glucose-Capillary: 115 mg/dL — ABNORMAL HIGH (ref 70–99)
Glucose-Capillary: 116 mg/dL — ABNORMAL HIGH (ref 70–99)
Glucose-Capillary: 119 mg/dL — ABNORMAL HIGH (ref 70–99)
Glucose-Capillary: 128 mg/dL — ABNORMAL HIGH (ref 70–99)
Glucose-Capillary: 129 mg/dL — ABNORMAL HIGH (ref 70–99)
Glucose-Capillary: 165 mg/dL — ABNORMAL HIGH (ref 70–99)

## 2023-07-05 LAB — BRAIN NATRIURETIC PEPTIDE: B Natriuretic Peptide: 66.1 pg/mL (ref 0.0–100.0)

## 2023-07-05 MED ORDER — PROSOURCE TF20 ENFIT COMPATIBL EN LIQD: 60.0000 mL | Freq: Every day | ENTERAL | Status: DC

## 2023-07-05 MED ORDER — SODIUM CHLORIDE 0.9 % IV BOLUS
500.0000 mL | Freq: Once | INTRAVENOUS | Status: AC
  Administered 2023-07-05: 500 mL via INTRAVENOUS

## 2023-07-05 MED ORDER — PANTOPRAZOLE SODIUM 40 MG IV SOLR: 40.0000 mg | Freq: Two times a day (BID) | INTRAVENOUS | Status: DC

## 2023-07-05 MED ORDER — OSMOLITE 1.5 CAL PO LIQD: 1000.0000 mL | ORAL | Status: DC

## 2023-07-05 MED ORDER — SCOPOLAMINE 1 MG/3DAYS TD PT72: 1.0000 | MEDICATED_PATCH | TRANSDERMAL | Status: DC

## 2023-07-05 NOTE — Discharge Summary (Addendum)
Garrett Fleming NWG:[id number] DOB: 09-Mar-1956 DOA: 06/21/2023  PCP: Quintella Reichert, MD  Admit date: 06/21/2023  Discharge date: 07/05/2023  Admitted From: Home   Disposition:  [facility] - Dr Laverda Sorenson ENT   Recommendations for Outpatient Follow-up:   Follow up with PCP in 1-2 weeks  PCP Please obtain BMP/CBC, 2 view CXR in 1week,  (see Discharge instructions)   PCP Please follow up on the following pending results:     Home Health: None   Equipment/Devices: None  Consultations: ENT, PCCM Discharge Condition: Stable    CODE STATUS: Full    Diet Recommendation: NPO. by mouth, oral medications and diet via PEG tube.    Chief Complaint  Patient presents with   Shortness of Breath     Brief history of present illness from the day of admission and additional interim summary    68 y.o.  male with known history of laryngeal cancer (recent PEG tube insertion-followed at [facility])-presented with respiratory distress-required emergent tracheostomy by ENT.  Further Hospital course complicated by excessive secretions from trachea-likely related to laryngeal mass requiring frequent suctioning/pulmonary tolerating/chest PT-and upper GI bleeding due to acute blood loss anemia.  See below for further details.   Significant events: 9/22>> Admit with emergent Trach by ENT 9/24>> transferred to [facility] 9/27>> tracheostomy changed by ENT 10/2>> black stools-PPI started-GI consulted. 10/4>>EGD: grade D esophagitis, nonbleeding duodenal ulcer   Significant studies: 9/26>> CT chest: Possible aspiration pneumonia, pneumomediastinum/subcutaneous emphysema. 9/26>> CT neck: Obstructing mass in the supraglottic larynx with extension to the inferior aspect of the thyroid cartilage. 9/30>> CT abdomen/pelvis: No  acute abnormality   Significant microbiology data: 9/22>> blood culture: No growth 9/22>> influenza/COVID PCR/RSV PCR: Negative   Procedures: 9/22>> tracheotomy by ENT 10/4>>EGD: grade D esophagitis, nonbleeding duodenal ulcer   Consults: ENT [facility] Course   Acute hypoxic respiratory failure secondary to obstructing laryngeal mass-known history of laryngeal cancer S/p emergent tracheotomy by ENT on 9/22 Trach changed by ENT on 9/27  Hospital course complicated by thick secretions-likely related to laryngeal cancer-on aggressive pulmonary toileting/Mucomyst/hypertonic nebs/chest PT   Difficult situation-patient essentially homeless-spouse not able to provide enough trach care to allow for safe discharge-appreciate ENT input on 10/3-spoke with Dr. Mackey Birchwood [facility] on 10/3-explained concern that given social situation/and issues with secretion-patient may not be able to keep outpatient appointment (scheduled for 10/12) or follow-up for laryngectomy date.  ENT at [facility] hesitant to take patient as a transfer for several weeks without having a definitive laryngectomy date-however upon further discussion, we agreed that once a laryngectomy date was posted-[facility] could accept the patient 2-3 days  Garrett Fleming NWG:[id number] DOB: 09-Mar-1956 DOA: 06/21/2023  PCP: Quintella Reichert, MD  Admit date: 06/21/2023  Discharge date: 07/05/2023  Admitted From: Home   Disposition:  [facility] - Dr Laverda Sorenson ENT   Recommendations for Outpatient Follow-up:   Follow up with PCP in 1-2 weeks  PCP Please obtain BMP/CBC, 2 view CXR in 1week,  (see Discharge instructions)   PCP Please follow up on the following pending results:     Home Health: None   Equipment/Devices: None  Consultations: ENT, PCCM Discharge Condition: Stable    CODE STATUS: Full    Diet Recommendation: NPO. by mouth, oral medications and diet via PEG tube.    Chief Complaint  Patient presents with   Shortness of Breath     Brief history of present illness from the day of admission and additional interim summary    68 y.o.  male with known history of laryngeal cancer (recent PEG tube insertion-followed at [facility])-presented with respiratory distress-required emergent tracheostomy by ENT.  Further Hospital course complicated by excessive secretions from trachea-likely related to laryngeal mass requiring frequent suctioning/pulmonary tolerating/chest PT-and upper GI bleeding due to acute blood loss anemia.  See below for further details.   Significant events: 9/22>> Admit with emergent Trach by ENT 9/24>> transferred to [facility] 9/27>> tracheostomy changed by ENT 10/2>> black stools-PPI started-GI consulted. 10/4>>EGD: grade D esophagitis, nonbleeding duodenal ulcer   Significant studies: 9/26>> CT chest: Possible aspiration pneumonia, pneumomediastinum/subcutaneous emphysema. 9/26>> CT neck: Obstructing mass in the supraglottic larynx with extension to the inferior aspect of the thyroid cartilage. 9/30>> CT abdomen/pelvis: No  acute abnormality   Significant microbiology data: 9/22>> blood culture: No growth 9/22>> influenza/COVID PCR/RSV PCR: Negative   Procedures: 9/22>> tracheotomy by ENT 10/4>>EGD: grade D esophagitis, nonbleeding duodenal ulcer   Consults: ENT [facility] Course   Acute hypoxic respiratory failure secondary to obstructing laryngeal mass-known history of laryngeal cancer S/p emergent tracheotomy by ENT on 9/22 Trach changed by ENT on 9/27  Hospital course complicated by thick secretions-likely related to laryngeal cancer-on aggressive pulmonary toileting/Mucomyst/hypertonic nebs/chest PT   Difficult situation-patient essentially homeless-spouse not able to provide enough trach care to allow for safe discharge-appreciate ENT input on 10/3-spoke with Dr. Mackey Birchwood [facility] on 10/3-explained concern that given social situation/and issues with secretion-patient may not be able to keep outpatient appointment (scheduled for 10/12) or follow-up for laryngectomy date.  ENT at [facility] hesitant to take patient as a transfer for several weeks without having a definitive laryngectomy date-however upon further discussion, we agreed that once a laryngectomy date was posted-[facility] could accept the patient 2-3 days  Reche Dixon M.D.   On: 06/25/2023 16:32   DG CHEST PORT 1 VIEW  Result Date: 06/25/2023 CLINICAL DATA:  Tracheostomy care. EXAM: PORTABLE CHEST 1 VIEW COMPARISON:  06/22/2023 FINDINGS: Tracheostomy tube unchanged in positioning with tip over the trachea at the level of the clavicular heads. Aortic stent graft unchanged in position. Stable heart size and mediastinal contours. Subcutaneous emphysema in the right greater than left chest wall, improving. No visible pneumothorax. No significant pleural effusion. No focal airspace disease. IMPRESSION: 1. Tracheostomy tube unchanged in positioning with tip over the trachea at the level of the clavicular heads. 2. Improving subcutaneous emphysema. Electronically Signed   By: Narda Rutherford M.D.   On: 06/25/2023 10:22   VAS [place] LOWER EXTREMITY VENOUS (DVT)  Result Date: 06/24/2023  Lower Venous DVT Study Patient Name:   UZIAH SORTER  Date of Exam:   06/24/2023 Medical Rec #: [id number]         Accession #:    [id number] Date of Birth: 09/08/1956         Patient Gender: M Patient Age:   56 years Exam Location:  [facility] Procedure:      VAS [place] LOWER EXTREMITY VENOUS (DVT) Referring Phys: Kalman Shan --------------------------------------------------------------------------------  Indications: "edema" per MD order.  Comparison Study: No previous exams Performing Technologist: Jody Hill RVT, RDMS  Examination Guidelines: A complete evaluation includes B-mode imaging, spectral Doppler, color Doppler, and power Doppler as needed of all accessible portions of each vessel. Bilateral testing is considered an integral part of a complete examination. Limited examinations for reoccurring indications may be performed as noted. The reflux portion of the exam is performed with the patient in reverse Trendelenburg.  +---------+---------------+---------+-----------+----------+--------------+ RIGHT    CompressibilityPhasicitySpontaneityPropertiesThrombus Aging +---------+---------------+---------+-----------+----------+--------------+ CFV      Full           Yes      Yes                                 +---------+---------------+---------+-----------+----------+--------------+ SFJ      Full                                                        +---------+---------------+---------+-----------+----------+--------------+ FV Prox  Full           Yes      Yes                                 +---------+---------------+---------+-----------+----------+--------------+ FV Mid   Full           Yes      Yes                                 +---------+---------------+---------+-----------+----------+--------------+ FV DistalFull           Yes      Yes                                 +---------+---------------+---------+-----------+----------+--------------+ PFV      Full                                                         +---------+---------------+---------+-----------+----------+--------------+  Garrett Fleming NWG:[id number] DOB: 09-Mar-1956 DOA: 06/21/2023  PCP: Quintella Reichert, MD  Admit date: 06/21/2023  Discharge date: 07/05/2023  Admitted From: Home   Disposition:  [facility] - Dr Laverda Sorenson ENT   Recommendations for Outpatient Follow-up:   Follow up with PCP in 1-2 weeks  PCP Please obtain BMP/CBC, 2 view CXR in 1week,  (see Discharge instructions)   PCP Please follow up on the following pending results:     Home Health: None   Equipment/Devices: None  Consultations: ENT, PCCM Discharge Condition: Stable    CODE STATUS: Full    Diet Recommendation: NPO. by mouth, oral medications and diet via PEG tube.    Chief Complaint  Patient presents with   Shortness of Breath     Brief history of present illness from the day of admission and additional interim summary    68 y.o.  male with known history of laryngeal cancer (recent PEG tube insertion-followed at [facility])-presented with respiratory distress-required emergent tracheostomy by ENT.  Further Hospital course complicated by excessive secretions from trachea-likely related to laryngeal mass requiring frequent suctioning/pulmonary tolerating/chest PT-and upper GI bleeding due to acute blood loss anemia.  See below for further details.   Significant events: 9/22>> Admit with emergent Trach by ENT 9/24>> transferred to [facility] 9/27>> tracheostomy changed by ENT 10/2>> black stools-PPI started-GI consulted. 10/4>>EGD: grade D esophagitis, nonbleeding duodenal ulcer   Significant studies: 9/26>> CT chest: Possible aspiration pneumonia, pneumomediastinum/subcutaneous emphysema. 9/26>> CT neck: Obstructing mass in the supraglottic larynx with extension to the inferior aspect of the thyroid cartilage. 9/30>> CT abdomen/pelvis: No  acute abnormality   Significant microbiology data: 9/22>> blood culture: No growth 9/22>> influenza/COVID PCR/RSV PCR: Negative   Procedures: 9/22>> tracheotomy by ENT 10/4>>EGD: grade D esophagitis, nonbleeding duodenal ulcer   Consults: ENT [facility] Course   Acute hypoxic respiratory failure secondary to obstructing laryngeal mass-known history of laryngeal cancer S/p emergent tracheotomy by ENT on 9/22 Trach changed by ENT on 9/27  Hospital course complicated by thick secretions-likely related to laryngeal cancer-on aggressive pulmonary toileting/Mucomyst/hypertonic nebs/chest PT   Difficult situation-patient essentially homeless-spouse not able to provide enough trach care to allow for safe discharge-appreciate ENT input on 10/3-spoke with Dr. Mackey Birchwood [facility] on 10/3-explained concern that given social situation/and issues with secretion-patient may not be able to keep outpatient appointment (scheduled for 10/12) or follow-up for laryngectomy date.  ENT at [facility] hesitant to take patient as a transfer for several weeks without having a definitive laryngectomy date-however upon further discussion, we agreed that once a laryngectomy date was posted-[facility] could accept the patient 2-3 days  Garrett Fleming NWG:[id number] DOB: 09-Mar-1956 DOA: 06/21/2023  PCP: Quintella Reichert, MD  Admit date: 06/21/2023  Discharge date: 07/05/2023  Admitted From: Home   Disposition:  [facility] - Dr Laverda Sorenson ENT   Recommendations for Outpatient Follow-up:   Follow up with PCP in 1-2 weeks  PCP Please obtain BMP/CBC, 2 view CXR in 1week,  (see Discharge instructions)   PCP Please follow up on the following pending results:     Home Health: None   Equipment/Devices: None  Consultations: ENT, PCCM Discharge Condition: Stable    CODE STATUS: Full    Diet Recommendation: NPO. by mouth, oral medications and diet via PEG tube.    Chief Complaint  Patient presents with   Shortness of Breath     Brief history of present illness from the day of admission and additional interim summary    68 y.o.  male with known history of laryngeal cancer (recent PEG tube insertion-followed at [facility])-presented with respiratory distress-required emergent tracheostomy by ENT.  Further Hospital course complicated by excessive secretions from trachea-likely related to laryngeal mass requiring frequent suctioning/pulmonary tolerating/chest PT-and upper GI bleeding due to acute blood loss anemia.  See below for further details.   Significant events: 9/22>> Admit with emergent Trach by ENT 9/24>> transferred to [facility] 9/27>> tracheostomy changed by ENT 10/2>> black stools-PPI started-GI consulted. 10/4>>EGD: grade D esophagitis, nonbleeding duodenal ulcer   Significant studies: 9/26>> CT chest: Possible aspiration pneumonia, pneumomediastinum/subcutaneous emphysema. 9/26>> CT neck: Obstructing mass in the supraglottic larynx with extension to the inferior aspect of the thyroid cartilage. 9/30>> CT abdomen/pelvis: No  acute abnormality   Significant microbiology data: 9/22>> blood culture: No growth 9/22>> influenza/COVID PCR/RSV PCR: Negative   Procedures: 9/22>> tracheotomy by ENT 10/4>>EGD: grade D esophagitis, nonbleeding duodenal ulcer   Consults: ENT [facility] Course   Acute hypoxic respiratory failure secondary to obstructing laryngeal mass-known history of laryngeal cancer S/p emergent tracheotomy by ENT on 9/22 Trach changed by ENT on 9/27  Hospital course complicated by thick secretions-likely related to laryngeal cancer-on aggressive pulmonary toileting/Mucomyst/hypertonic nebs/chest PT   Difficult situation-patient essentially homeless-spouse not able to provide enough trach care to allow for safe discharge-appreciate ENT input on 10/3-spoke with Dr. Mackey Birchwood [facility] on 10/3-explained concern that given social situation/and issues with secretion-patient may not be able to keep outpatient appointment (scheduled for 10/12) or follow-up for laryngectomy date.  ENT at [facility] hesitant to take patient as a transfer for several weeks without having a definitive laryngectomy date-however upon further discussion, we agreed that once a laryngectomy date was posted-[facility] could accept the patient 2-3 days  Garrett Fleming NWG:[id number] DOB: 09-Mar-1956 DOA: 06/21/2023  PCP: Quintella Reichert, MD  Admit date: 06/21/2023  Discharge date: 07/05/2023  Admitted From: Home   Disposition:  [facility] - Dr Laverda Sorenson ENT   Recommendations for Outpatient Follow-up:   Follow up with PCP in 1-2 weeks  PCP Please obtain BMP/CBC, 2 view CXR in 1week,  (see Discharge instructions)   PCP Please follow up on the following pending results:     Home Health: None   Equipment/Devices: None  Consultations: ENT, PCCM Discharge Condition: Stable    CODE STATUS: Full    Diet Recommendation: NPO. by mouth, oral medications and diet via PEG tube.    Chief Complaint  Patient presents with   Shortness of Breath     Brief history of present illness from the day of admission and additional interim summary    68 y.o.  male with known history of laryngeal cancer (recent PEG tube insertion-followed at [facility])-presented with respiratory distress-required emergent tracheostomy by ENT.  Further Hospital course complicated by excessive secretions from trachea-likely related to laryngeal mass requiring frequent suctioning/pulmonary tolerating/chest PT-and upper GI bleeding due to acute blood loss anemia.  See below for further details.   Significant events: 9/22>> Admit with emergent Trach by ENT 9/24>> transferred to [facility] 9/27>> tracheostomy changed by ENT 10/2>> black stools-PPI started-GI consulted. 10/4>>EGD: grade D esophagitis, nonbleeding duodenal ulcer   Significant studies: 9/26>> CT chest: Possible aspiration pneumonia, pneumomediastinum/subcutaneous emphysema. 9/26>> CT neck: Obstructing mass in the supraglottic larynx with extension to the inferior aspect of the thyroid cartilage. 9/30>> CT abdomen/pelvis: No  acute abnormality   Significant microbiology data: 9/22>> blood culture: No growth 9/22>> influenza/COVID PCR/RSV PCR: Negative   Procedures: 9/22>> tracheotomy by ENT 10/4>>EGD: grade D esophagitis, nonbleeding duodenal ulcer   Consults: ENT [facility] Course   Acute hypoxic respiratory failure secondary to obstructing laryngeal mass-known history of laryngeal cancer S/p emergent tracheotomy by ENT on 9/22 Trach changed by ENT on 9/27  Hospital course complicated by thick secretions-likely related to laryngeal cancer-on aggressive pulmonary toileting/Mucomyst/hypertonic nebs/chest PT   Difficult situation-patient essentially homeless-spouse not able to provide enough trach care to allow for safe discharge-appreciate ENT input on 10/3-spoke with Dr. Mackey Birchwood [facility] on 10/3-explained concern that given social situation/and issues with secretion-patient may not be able to keep outpatient appointment (scheduled for 10/12) or follow-up for laryngectomy date.  ENT at [facility] hesitant to take patient as a transfer for several weeks without having a definitive laryngectomy date-however upon further discussion, we agreed that once a laryngectomy date was posted-[facility] could accept the patient 2-3 days  Reche Dixon M.D.   On: 06/25/2023 16:32   DG CHEST PORT 1 VIEW  Result Date: 06/25/2023 CLINICAL DATA:  Tracheostomy care. EXAM: PORTABLE CHEST 1 VIEW COMPARISON:  06/22/2023 FINDINGS: Tracheostomy tube unchanged in positioning with tip over the trachea at the level of the clavicular heads. Aortic stent graft unchanged in position. Stable heart size and mediastinal contours. Subcutaneous emphysema in the right greater than left chest wall, improving. No visible pneumothorax. No significant pleural effusion. No focal airspace disease. IMPRESSION: 1. Tracheostomy tube unchanged in positioning with tip over the trachea at the level of the clavicular heads. 2. Improving subcutaneous emphysema. Electronically Signed   By: Narda Rutherford M.D.   On: 06/25/2023 10:22   VAS [place] LOWER EXTREMITY VENOUS (DVT)  Result Date: 06/24/2023  Lower Venous DVT Study Patient Name:   UZIAH SORTER  Date of Exam:   06/24/2023 Medical Rec #: [id number]         Accession #:    [id number] Date of Birth: 09/08/1956         Patient Gender: M Patient Age:   56 years Exam Location:  [facility] Procedure:      VAS [place] LOWER EXTREMITY VENOUS (DVT) Referring Phys: Kalman Shan --------------------------------------------------------------------------------  Indications: "edema" per MD order.  Comparison Study: No previous exams Performing Technologist: Jody Hill RVT, RDMS  Examination Guidelines: A complete evaluation includes B-mode imaging, spectral Doppler, color Doppler, and power Doppler as needed of all accessible portions of each vessel. Bilateral testing is considered an integral part of a complete examination. Limited examinations for reoccurring indications may be performed as noted. The reflux portion of the exam is performed with the patient in reverse Trendelenburg.  +---------+---------------+---------+-----------+----------+--------------+ RIGHT    CompressibilityPhasicitySpontaneityPropertiesThrombus Aging +---------+---------------+---------+-----------+----------+--------------+ CFV      Full           Yes      Yes                                 +---------+---------------+---------+-----------+----------+--------------+ SFJ      Full                                                        +---------+---------------+---------+-----------+----------+--------------+ FV Prox  Full           Yes      Yes                                 +---------+---------------+---------+-----------+----------+--------------+ FV Mid   Full           Yes      Yes                                 +---------+---------------+---------+-----------+----------+--------------+ FV DistalFull           Yes      Yes                                 +---------+---------------+---------+-----------+----------+--------------+ PFV      Full                                                         +---------+---------------+---------+-----------+----------+--------------+  Garrett Fleming NWG:[id number] DOB: 09-Mar-1956 DOA: 06/21/2023  PCP: Quintella Reichert, MD  Admit date: 06/21/2023  Discharge date: 07/05/2023  Admitted From: Home   Disposition:  [facility] - Dr Laverda Sorenson ENT   Recommendations for Outpatient Follow-up:   Follow up with PCP in 1-2 weeks  PCP Please obtain BMP/CBC, 2 view CXR in 1week,  (see Discharge instructions)   PCP Please follow up on the following pending results:     Home Health: None   Equipment/Devices: None  Consultations: ENT, PCCM Discharge Condition: Stable    CODE STATUS: Full    Diet Recommendation: NPO. by mouth, oral medications and diet via PEG tube.    Chief Complaint  Patient presents with   Shortness of Breath     Brief history of present illness from the day of admission and additional interim summary    68 y.o.  male with known history of laryngeal cancer (recent PEG tube insertion-followed at [facility])-presented with respiratory distress-required emergent tracheostomy by ENT.  Further Hospital course complicated by excessive secretions from trachea-likely related to laryngeal mass requiring frequent suctioning/pulmonary tolerating/chest PT-and upper GI bleeding due to acute blood loss anemia.  See below for further details.   Significant events: 9/22>> Admit with emergent Trach by ENT 9/24>> transferred to [facility] 9/27>> tracheostomy changed by ENT 10/2>> black stools-PPI started-GI consulted. 10/4>>EGD: grade D esophagitis, nonbleeding duodenal ulcer   Significant studies: 9/26>> CT chest: Possible aspiration pneumonia, pneumomediastinum/subcutaneous emphysema. 9/26>> CT neck: Obstructing mass in the supraglottic larynx with extension to the inferior aspect of the thyroid cartilage. 9/30>> CT abdomen/pelvis: No  acute abnormality   Significant microbiology data: 9/22>> blood culture: No growth 9/22>> influenza/COVID PCR/RSV PCR: Negative   Procedures: 9/22>> tracheotomy by ENT 10/4>>EGD: grade D esophagitis, nonbleeding duodenal ulcer   Consults: ENT [facility] Course   Acute hypoxic respiratory failure secondary to obstructing laryngeal mass-known history of laryngeal cancer S/p emergent tracheotomy by ENT on 9/22 Trach changed by ENT on 9/27  Hospital course complicated by thick secretions-likely related to laryngeal cancer-on aggressive pulmonary toileting/Mucomyst/hypertonic nebs/chest PT   Difficult situation-patient essentially homeless-spouse not able to provide enough trach care to allow for safe discharge-appreciate ENT input on 10/3-spoke with Dr. Mackey Birchwood [facility] on 10/3-explained concern that given social situation/and issues with secretion-patient may not be able to keep outpatient appointment (scheduled for 10/12) or follow-up for laryngectomy date.  ENT at [facility] hesitant to take patient as a transfer for several weeks without having a definitive laryngectomy date-however upon further discussion, we agreed that once a laryngectomy date was posted-[facility] could accept the patient 2-3 days

## 2023-07-05 NOTE — Plan of Care (Signed)
  Problem: Education: Goal: Knowledge about tracheostomy care/management will improve Outcome: Progressing   Problem: Activity: Goal: Ability to tolerate increased activity will improve Outcome: Progressing   Problem: Respiratory: Goal: Patent airway maintenance will improve Outcome: Progressing   Problem: Clinical Measurements: Goal: Ability to maintain clinical measurements within normal limits will improve Outcome: Progressing   Problem: Nutrition: Goal: Adequate nutrition will be maintained Outcome: Progressing

## 2023-07-05 NOTE — Progress Notes (Signed)
Spoke with Kennith Center at [facility] transfer center. Kennith Center stated she would call the nurse's station at [phone] when bed is available.  [facility] transfer center #: [phone]

## 2023-07-05 NOTE — Progress Notes (Signed)
PROGRESS NOTE        PATIENT DETAILS Name: Garrett Fleming Age: 67 y.o. Sex: male Date of Birth: 04-11-1956 Admit Date: 06/21/2023 Admitting Physician Lorin Glass, MD JXB:[id number], [name], MD  Brief Summary: Patient is a 67 y.o.  male with known history of laryngeal cancer (recent PEG tube insertion-followed at [facility])-presented with respiratory distress-required emergent tracheostomy by ENT.  Further Hospital course complicated by excessive secretions from trachea-likely related to laryngeal mass requiring frequent suctioning/pulmonary tolerating/chest PT-and upper GI bleeding due to acute blood loss anemia.  See below for further details.  Significant events: 9/22>> Admit with emergent Trach by ENT 9/24>> transferred to [facility] 9/27>> tracheostomy changed by ENT 10/2>> black stools-PPI started-GI consulted. 10/4>>EGD: grade D esophagitis, nonbleeding duodenal ulcer  Significant studies: 9/26>> CT chest: Possible aspiration pneumonia, pneumomediastinum/subcutaneous emphysema. 9/26>> CT neck: Obstructing mass in the supraglottic larynx with extension to the inferior aspect of the thyroid cartilage. 9/30>> CT abdomen/pelvis: No acute abnormality  Significant microbiology data: 9/22>> blood culture: No growth 9/22>> influenza/COVID PCR/RSV PCR: Negative  Procedures: 9/22>> tracheotomy by ENT 10/4>>EGD: grade D esophagitis, nonbleeding duodenal ulcer  Consults: ENT PCCM  Subjective:  Patient in bed, appears comfortable, denies any headache, no fever, no chest pain or pressure, no shortness of breath , no abdominal pain. No new focal weakness.   Objective: Vitals: Blood pressure (!) 92/58, pulse 66, temperature 99.1 F (37.3 C), temperature source Oral, resp. rate 17, height 6\' 4"  (1.93 m), weight 59.3 kg, SpO2 95%.   Exam:  Awake Alert, No new F.N deficits, Normal affect [place].AT,PERRAL, wearing trach collar, some mild secretions noted  around the tracheostomy site, PEG tube in place Supple Neck, No JVD,   Symmetrical Chest wall movement, Good air movement bilaterally, CTAB RRR,No Gallops, Rubs or new Murmurs,  +ve B.Sounds, Abd Soft, No tenderness,   No Cyanosis, Clubbing or edema    Assessment/Plan:  Acute hypoxic respiratory failure secondary to obstructing laryngeal mass-known history of laryngeal cancer S/p emergent tracheotomy by ENT on 9/22 Trach changed by ENT on 9/27  Hospital course complicated by thick secretions-likely related to laryngeal cancer-on aggressive pulmonary toileting/Mucomyst/hypertonic nebs/chest PT  Difficult situation-patient essentially homeless-spouse not able to provide enough trach care to allow for safe discharge-appreciate ENT input on 10/3-spoke with Dr. Mackey Birchwood [facility] on 10/3-explained concern that given social situation/and issues with secretion-patient may not be able to keep outpatient appointment (scheduled for 10/12) or follow-up for laryngectomy date.  ENT at [facility] hesitant to take patient as a transfer for several weeks without having a definitive laryngectomy date-however upon further discussion, we agreed that once a laryngectomy date was posted-[facility] could accept the patient 2-3 days prior as a transfer.  I have left my cell number with Dr Laverda Sorenson (Dr Tito Dine [phone]), he will either contact Dr. Ernestene Kiel or me-once we know when laryngectomy can be performed.    Per spouse-patient has an appointment with ENT at [facility] on 10/12.  Case discussed with [facility] ENT resident on-call Dr. Tito Dine on 07/05/2023, he is arranging for transfer once bed is available.  Vomiting Occurring after coughing spells CT abdomen nonacute No further vomiting for 4 days-on tube feeds-slowly increase to goal rate over the next several days.    Upper GI bleeding with acute blood loss anemia Melanotic stools started on  10/2 EGD-with  duodenal ulcers but per GI-likely bleeding from esophagitis PPI twice daily x 8 weeks recommended Awaiting pathology. Hb stable after 2 units of PRBC.    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  Aspiration pneumonia Although stable-on trach collar  Completed Unasyn 9/27 Continue to mobilize-pulmonary tolerating-see above  Difficult situation-essentially homeless-spouse not able to provide enough trach care to follow safe discharge.  Not a candidate for SNF for 30 days-as this is a new tear trach  AKI Hemodynamically mediated Resolved  Normocytic anemia Secondary to underlying malignancy and acute blood loss from GI bleeding Hb stable after 2 units of PRBC-plan is to follow closely.  HTN BP off, discontinue Norvasc.  Social issues Essentially homeless-currently in between appartments (had to leave their long standing rental due to landlords death)-spouse is looking for a place-they get the Social Security checks on 10/3-in the interim-they have been living in a [employer].  See above concerns regarding laryngeal cancer-and difficulty in getting to [facility]. [facility] team following.  Nutrition Status: Nutrition Problem: Severe Malnutrition Etiology: chronic illness (laryngeal mass) Signs/Symptoms: severe fat depletion, severe muscle depletion Interventions: Refer to RD note for recommendations, Tube feeding  Underweight: Estimated body mass index is 15.91 kg/m as calculated from the following:   Height as of this encounter: 6\' 4"  (1.93 m).   Weight as of this encounter: 59.3 kg.   Code status:   Code Status: Full Code   DVT Prophylaxis: SCDs Start: 06/21/23 1845   Family Communication: Spouse at bedside.   Disposition Plan: Status is: Inpatient Remains inpatient appropriate because: Severity of illness   Planned Discharge Destination:SNF   Diet: Diet Order             Diet NPO time specified  Diet effective now                      Antimicrobial agents: Anti-infectives (From admission, onward)    Start     Dose/Rate Route Frequency Ordered Stop   06/21/23 2300  Ampicillin-Sulbactam (UNASYN) 3 g in sodium chloride 0.9 % 100 mL IVPB        3 g 200 mL/hr over 30 Minutes Intravenous Every 6 hours 06/21/23 1857 06/27/23 0659   06/21/23 1700  vancomycin (VANCOREADY) IVPB 1500 mg/300 mL  Status:  Discontinued        1,500 mg 150 mL/hr over 120 Minutes Intravenous  Once 06/21/23 1648 06/22/23 1552   06/21/23 1700  piperacillin-tazobactam (ZOSYN) IVPB 3.375 g        3.375 g 100 mL/hr over 30 Minutes Intravenous  Once 06/21/23 1648 06/21/23 1726        MEDICATIONS: Scheduled Meds:  sodium chloride   Intravenous Once   sodium chloride   Intravenous Once   feeding supplement (PROSource TF20)  60 mL Per Tube Daily   guaiFENesin  15 mL Per Tube BID   ipratropium-albuterol  3 mL Nebulization BID   mouth rinse  15 mL Mouth Rinse 4 times per day   pantoprazole  40 mg Intravenous Q12H   polyethylene glycol  17 g Per Tube Daily   scopolamine  1 patch Transdermal Q72H   Continuous Infusions:  feeding supplement (OSMOLITE 1.5 CAL) 1,000 mL (07/04/23 2014)   sodium chloride     PRN Meds:.acetaminophen, albuterol, docusate, hydrALAZINE, ondansetron (ZOFRAN) IV, mouth rinse, oxyCODONE, polyethylene glycol   I have personally reviewed following labs and imaging studies  LABORATORY DATA:  Recent Labs  Lab 07/01/23 1858 07/02/23 0400 07/02/23 2030 07/03/23 0531 07/04/23 0722 07/05/23 0548  WBC 6.6 7.3  --  9.3 7.8 6.8  HGB 8.2* 7.0* 7.0* 7.8* 7.6* 7.3*  HCT 25.6* 21.1* 21.3* 23.3* 22.8* 22.5*  PLT 291 270  --  233 263 267  MCV 98.1 96.3  --  95.1 95.4 95.3  MCH 31.4 32.0  --  31.8 31.8 30.9  MCHC 32.0 33.2  --  33.5 33.3 32.4  RDW 13.5 13.7  --  15.4 15.1 15.2  LYMPHSABS  --   --   --   --  0.8 1.0  MONOABS  --   --   --   --  0.5 0.6  EOSABS  --   --   --   --  0.0 0.1  BASOSABS  --   --    --   --  0.0 0.0    Recent Labs  Lab 06/30/23 0344 07/01/23 0355 07/02/23 0400 07/03/23 0531 07/04/23 0722 07/05/23 0548  NA  --  136 137 134* 133* 134*  K  --  3.8 3.7 3.7 3.6 3.8  CL  --  104 104 102 101 100  CO2  --  26 25 25 25 26   ANIONGAP  --  6 8 7 7 8   GLUCOSE  --  109* 96 95 133* 132*  BUN  --  33* 24* 16 12 10   CREATININE  --  0.79 0.73 0.83 0.96 0.71  CRP  --   --   --   --  5.9* 4.0*  INR  --   --  1.1  --   --   --   BNP  --   --   --   --  94.8 66.1  MG 2.1 2.0  --   --  1.7 1.8  CALCIUM  --  8.2* 8.1* 7.9* 7.8* 7.9*    Lab Results  Component Value Date   CHOL 168 03/20/2020   HDL 80 03/20/2020   LDLCALC 58 03/20/2020   TRIG 189 (H) 03/20/2020   CHOLHDL 2.1 03/20/2020      Recent Labs  Lab 06/30/23 0344 07/01/23 0355 07/02/23 0400 07/03/23 0531 07/04/23 0722 07/05/23 0548  CRP  --   --   --   --  5.9* 4.0*  INR  --   --  1.1  --   --   --   BNP  --   --   --   --  94.8 66.1  MG 2.1 2.0  --   --  1.7 1.8  CALCIUM  --  8.2* 8.1* 7.9* 7.8* 7.9*    No results for input(s): "TSH", "T4TOTAL", "FREET4", "T3FREE", "THYROIDAB" in the last 72 hours.  No results for input(s): "VITAMINB12", "FOLATE", "FERRITIN", "TIBC", "IRON", "RETICCTPCT" in the last 72 hours.   RADIOLOGY STUDIES/RESULTS: DG Chest Port 1 View  Result Date: 07/05/2023 CLINICAL DATA:  Shortness of breath. Ascending aortic aneurysm repair. EXAM: PORTABLE CHEST 1 VIEW COMPARISON:  06/29/2023 FINDINGS: Cardiac enlargement. Aortic tortuosity with stent graft. Tracheostomy tube. There is no edema, consolidation, effusion, or pneumothorax. IMPRESSION: No new or active finding. Electronically Signed   By: Tiburcio Pea M.D.   On: 07/05/2023 07:09     LOS: 14 days   Signature  -    Susa Raring M.D on 07/05/2023 at 10:43 AM   -  To page go to [email or web address]

## 2023-07-05 NOTE — Plan of Care (Signed)
  Problem: Education: Goal: Knowledge about tracheostomy care/management will improve Outcome: Progressing   Problem: Activity: Goal: Ability to tolerate increased activity will improve Outcome: Progressing   Problem: Health Behavior/Discharge Planning: Goal: Ability to manage tracheostomy will improve Outcome: Progressing   Problem: Respiratory: Goal: Patent airway maintenance will improve Outcome: Progressing   Problem: Nutrition: Goal: Adequate nutrition will be maintained Outcome: Progressing   Problem: Coping: Goal: Level of anxiety will decrease Outcome: Progressing   Problem: Elimination: Goal: Will not experience complications related to bowel motility Outcome: Progressing   Problem: Safety: Goal: Ability to remain free from injury will improve Outcome: Progressing   Problem: Skin Integrity: Goal: Risk for impaired skin integrity will decrease Outcome: Progressing

## 2023-07-06 ENCOUNTER — Encounter (HOSPITAL_COMMUNITY): Payer: Self-pay | Admitting: Gastroenterology

## 2023-07-06 DIAGNOSIS — E43 Unspecified severe protein-calorie malnutrition: Secondary | ICD-10-CM | POA: Diagnosis not present

## 2023-07-06 DIAGNOSIS — C4442 Squamous cell carcinoma of skin of scalp and neck: Secondary | ICD-10-CM | POA: Diagnosis not present

## 2023-07-06 DIAGNOSIS — C320 Malignant neoplasm of glottis: Secondary | ICD-10-CM | POA: Diagnosis not present

## 2023-07-06 DIAGNOSIS — Z930 Tracheostomy status: Secondary | ICD-10-CM | POA: Diagnosis not present

## 2023-07-06 DIAGNOSIS — Z5900 Homelessness unspecified: Secondary | ICD-10-CM | POA: Diagnosis not present

## 2023-07-06 DIAGNOSIS — Z430 Encounter for attention to tracheostomy: Secondary | ICD-10-CM | POA: Diagnosis not present

## 2023-07-06 DIAGNOSIS — R1310 Dysphagia, unspecified: Secondary | ICD-10-CM | POA: Diagnosis not present

## 2023-07-06 DIAGNOSIS — I10 Essential (primary) hypertension: Secondary | ICD-10-CM | POA: Diagnosis not present

## 2023-07-06 DIAGNOSIS — Z9002 Acquired absence of larynx: Secondary | ICD-10-CM | POA: Diagnosis not present

## 2023-07-06 DIAGNOSIS — Z931 Gastrostomy status: Secondary | ICD-10-CM | POA: Diagnosis not present

## 2023-07-06 DIAGNOSIS — C329 Malignant neoplasm of larynx, unspecified: Secondary | ICD-10-CM | POA: Diagnosis not present

## 2023-07-06 DIAGNOSIS — D62 Acute posthemorrhagic anemia: Secondary | ICD-10-CM | POA: Diagnosis not present

## 2023-07-06 DIAGNOSIS — Z681 Body mass index (BMI) 19 or less, adult: Secondary | ICD-10-CM | POA: Diagnosis not present

## 2023-07-06 DIAGNOSIS — K089 Disorder of teeth and supporting structures, unspecified: Secondary | ICD-10-CM | POA: Diagnosis not present

## 2023-07-06 DIAGNOSIS — K027 Dental root caries: Secondary | ICD-10-CM | POA: Diagnosis not present

## 2023-07-06 DIAGNOSIS — K029 Dental caries, unspecified: Secondary | ICD-10-CM | POA: Diagnosis not present

## 2023-07-06 LAB — BASIC METABOLIC PANEL
Anion gap: 6 (ref 5–15)
BUN: 12 mg/dL (ref 8–23)
CO2: 27 mmol/L (ref 22–32)
Calcium: 8 mg/dL — ABNORMAL LOW (ref 8.9–10.3)
Chloride: 101 mmol/L (ref 98–111)
Creatinine, Ser: 0.73 mg/dL (ref 0.61–1.24)
GFR, Estimated: >60 mL/min (ref >60)
Glucose, Bld: 132 mg/dL — ABNORMAL HIGH (ref 70–99)
Potassium: 3.8 mmol/L (ref 3.5–5.1)
Sodium: 134 mmol/L — ABNORMAL LOW (ref 135–145)

## 2023-07-06 LAB — CBC WITH DIFFERENTIAL/PLATELET
Abs Immature Granulocytes: 0.02 10*3/uL (ref 0.00–0.07)
Basophils Absolute: 0 10*3/uL (ref 0.0–0.1)
Basophils Relative: 1 %
Eosinophils Absolute: 0.1 10*3/uL (ref 0.0–0.5)
Eosinophils Relative: 2 %
HCT: 22.6 % — ABNORMAL LOW (ref 39.0–52.0)
Hemoglobin: 7.4 g/dL — ABNORMAL LOW (ref 13.0–17.0)
Immature Granulocytes: 0 %
Lymphocytes Relative: 15 %
Lymphs Abs: 0.8 10*3/uL (ref 0.7–4.0)
MCH: 32 pg (ref 26.0–34.0)
MCHC: 32.7 g/dL (ref 30.0–36.0)
MCV: 97.8 fL (ref 80.0–100.0)
Monocytes Absolute: 0.5 10*3/uL (ref 0.1–1.0)
Monocytes Relative: 9 %
Neutro Abs: 4 10*3/uL (ref 1.7–7.7)
Neutrophils Relative %: 73 %
Platelets: 290 10*3/uL (ref 150–400)
RBC: 2.31 MIL/uL — ABNORMAL LOW (ref 4.22–5.81)
RDW: 14.7 % (ref 11.5–15.5)
WBC: 5.4 10*3/uL (ref 4.0–10.5)
nRBC: 0 % (ref 0.0–0.2)

## 2023-07-06 LAB — GLUCOSE, CAPILLARY
Glucose-Capillary: 109 mg/dL — ABNORMAL HIGH (ref 70–99)
Glucose-Capillary: 110 mg/dL — ABNORMAL HIGH (ref 70–99)
Glucose-Capillary: 125 mg/dL — ABNORMAL HIGH (ref 70–99)
Glucose-Capillary: 127 mg/dL — ABNORMAL HIGH (ref 70–99)
Glucose-Capillary: 128 mg/dL — ABNORMAL HIGH (ref 70–99)
Glucose-Capillary: 98 mg/dL (ref 70–99)

## 2023-07-06 LAB — BRAIN NATRIURETIC PEPTIDE: B Natriuretic Peptide: 92.1 pg/mL (ref 0.0–100.0)

## 2023-07-06 LAB — MAGNESIUM: Magnesium: 1.9 mg/dL (ref 1.7–2.4)

## 2023-07-06 LAB — C-REACTIVE PROTEIN: CRP: 2.7 mg/dL — ABNORMAL HIGH (ref <1.0)

## 2023-07-06 LAB — SURGICAL PATHOLOGY

## 2023-07-06 MED ORDER — SODIUM CHLORIDE 0.9 % IV BOLUS
500.0000 mL | Freq: Once | INTRAVENOUS | Status: AC
  Administered 2023-07-06: 500 mL via INTRAVENOUS

## 2023-07-06 NOTE — Progress Notes (Signed)
PROGRESS NOTE        PATIENT DETAILS Name: Garrett Fleming Age: 67 y.o. Sex: male Date of Birth: 04/06/1956 Admit Date: 06/21/2023 Admitting Physician Lorin Glass, MD WUJ:[id number], [name], MD  Brief Summary: Patient is a 67 y.o.  male with known history of laryngeal cancer (recent PEG tube insertion-followed at [facility])-presented with respiratory distress-required emergent tracheostomy by ENT.  Further Hospital course complicated by excessive secretions from trachea-likely related to laryngeal mass requiring frequent suctioning/pulmonary tolerating/chest PT-and upper GI bleeding due to acute blood loss anemia.  See below for further details.  Significant events: 9/22>> Admit with emergent Trach by ENT 9/24>> transferred to [facility] 9/27>> tracheostomy changed by ENT 10/2>> black stools-PPI started-GI consulted. 10/4>>EGD: grade D esophagitis, nonbleeding duodenal ulcer  Significant studies: 9/26>> CT chest: Possible aspiration pneumonia, pneumomediastinum/subcutaneous emphysema. 9/26>> CT neck: Obstructing mass in the supraglottic larynx with extension to the inferior aspect of the thyroid cartilage. 9/30>> CT abdomen/pelvis: No acute abnormality  Significant microbiology data: 9/22>> blood culture: No growth 9/22>> influenza/COVID PCR/RSV PCR: Negative  Procedures: 9/22>> tracheotomy by ENT 10/4>>EGD: grade D esophagitis, nonbleeding duodenal ulcer  Consults: ENT PCCM  Subjective: Patient in bed, appears comfortable, denies any headache, no fever, no chest pain or pressure, no shortness of breath , no abdominal pain. No focal weakness.   Objective: Vitals: Blood pressure (!) 101/59, pulse (!) 59, temperature 99.3 F (37.4 C), temperature source Oral, resp. rate 19, height 6\' 4"  (1.93 m), weight 59.3 kg, SpO2 99%.   Exam:  Awake Alert, No new F.N deficits, Normal affect [place].AT,PERRAL, wearing trach collar, some mild secretions noted  around the tracheostomy site, PEG tube in place Supple Neck, No JVD,   Symmetrical Chest wall movement, Good air movement bilaterally, CTAB RRR,No Gallops, Rubs or new Murmurs,  +ve B.Sounds, Abd Soft, No tenderness,   No Cyanosis, Clubbing or edema    Assessment/Plan:   Note patient has been transferred to [facility], awaiting bed, paperwork done.      Acute hypoxic respiratory failure secondary to obstructing laryngeal mass-known history of laryngeal cancer S/p emergent tracheotomy by ENT on 9/22 Trach changed by ENT on 9/27  Hospital course complicated by thick secretions-likely related to laryngeal cancer-on aggressive pulmonary toileting/Mucomyst/hypertonic nebs/chest PT  Difficult situation-patient essentially homeless-spouse not able to provide enough trach care to allow for safe discharge-appreciate ENT input on 10/3-spoke with Dr. Mackey Birchwood [facility] on 10/3-explained concern that given social situation/and issues with secretion-patient may not be able to keep outpatient appointment (scheduled for 10/12) or follow-up for laryngectomy date.  ENT at [facility] hesitant to take patient as a transfer for several weeks without having a definitive laryngectomy date-however upon further discussion, we agreed that once a laryngectomy date was posted-[facility] could accept the patient 2-3 days prior as a transfer.  I have left my cell number with Dr Laverda Sorenson (Dr Tito Dine [phone]), he will either contact Dr. Ernestene Kiel or me-once we know when laryngectomy can be performed.    Per spouse-patient has an appointment with ENT at [facility] on 10/12.  Case discussed with [facility] ENT resident on-call Dr. Tito Dine on 07/05/2023, he is arranging for transfer once bed is available.  Vomiting Occurring after coughing spells CT abdomen nonacute No further vomiting for 4 days-on tube feeds-slowly increase to goal rate over the  next several days.     Upper GI bleeding with acute blood loss anemia Melanotic stools started on 10/2 EGD-with duodenal ulcers but per GI-likely bleeding from esophagitis PPI twice daily x 8 weeks recommended Awaiting pathology. Hb stable after 2 units of PRBC.    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  Aspiration pneumonia Although stable-on trach collar  Completed Unasyn 9/27 Continue to mobilize-pulmonary tolerating-see above  Difficult situation-essentially homeless-spouse not able to provide enough trach care to follow safe discharge.  Not a candidate for SNF for 30 days-as this is a new tear trach  AKI Hemodynamically mediated Resolved  Normocytic anemia Secondary to underlying malignancy and acute blood loss from GI bleeding Hb stable after 2 units of PRBC-plan is to follow closely.  HTN BP off, discontinue Norvasc.  Social issues Essentially homeless-currently in between appartments (had to leave their long standing rental due to landlords death)-spouse is looking for a place-they get the Social Security checks on 10/3-in the interim-they have been living in a [employer].  See above concerns regarding laryngeal cancer-and difficulty in getting to [facility]. [facility] team following.  Nutrition Status: Nutrition Problem: Severe Malnutrition Etiology: chronic illness (laryngeal mass) Signs/Symptoms: severe fat depletion, severe muscle depletion Interventions: Refer to RD note for recommendations, Tube feeding  Underweight: Estimated body mass index is 15.91 kg/m as calculated from the following:   Height as of this encounter: 6\' 4"  (1.93 m).   Weight as of this encounter: 59.3 kg.   Code status:   Code Status: Full Code   DVT Prophylaxis: SCDs Start: 06/21/23 1845   Family Communication: Spouse at bedside.   Disposition Plan: Status is: Inpatient Remains inpatient appropriate because: Severity of illness   Planned Discharge  Destination:SNF   Diet: Diet Order             Diet NPO time specified  Diet effective now                     Antimicrobial agents: Anti-infectives (From admission, onward)    Start     Dose/Rate Route Frequency Ordered Stop   06/21/23 2300  Ampicillin-Sulbactam (UNASYN) 3 g in sodium chloride 0.9 % 100 mL IVPB        3 g 200 mL/hr over 30 Minutes Intravenous Every 6 hours 06/21/23 1857 06/27/23 0659   06/21/23 1700  vancomycin (VANCOREADY) IVPB 1500 mg/300 mL  Status:  Discontinued        1,500 mg 150 mL/hr over 120 Minutes Intravenous  Once 06/21/23 1648 06/22/23 1552   06/21/23 1700  piperacillin-tazobactam (ZOSYN) IVPB 3.375 g        3.375 g 100 mL/hr over 30 Minutes Intravenous  Once 06/21/23 1648 06/21/23 1726        MEDICATIONS: Scheduled Meds:  sodium chloride   Intravenous Once   sodium chloride   Intravenous Once   feeding supplement (PROSource TF20)  60 mL Per Tube Daily   guaiFENesin  15 mL Per Tube BID   ipratropium-albuterol  3 mL Nebulization BID   mouth rinse  15 mL Mouth Rinse 4 times per day   pantoprazole  40 mg Intravenous Q12H   polyethylene glycol  17 g Per Tube Daily   scopolamine  1 patch Transdermal Q72H   Continuous Infusions:  feeding supplement (OSMOLITE 1.5 CAL) 1,000 mL (07/05/23 1954)   PRN Meds:.acetaminophen, albuterol, docusate, hydrALAZINE, ondansetron (ZOFRAN) IV, mouth rinse, oxyCODONE, polyethylene glycol   I have personally  reviewed following labs and imaging studies  LABORATORY DATA:  Recent Labs  Lab 07/02/23 0400 07/02/23 2030 07/03/23 0531 07/04/23 0722 07/05/23 0548 07/06/23 0355  WBC 7.3  --  9.3 7.8 6.8 5.4  HGB 7.0* 7.0* 7.8* 7.6* 7.3* 7.4*  HCT 21.1* 21.3* 23.3* 22.8* 22.5* 22.6*  PLT 270  --  233 263 267 290  MCV 96.3  --  95.1 95.4 95.3 97.8  MCH 32.0  --  31.8 31.8 30.9 32.0  MCHC 33.2  --  33.5 33.3 32.4 32.7  RDW 13.7  --  15.4 15.1 15.2 14.7  LYMPHSABS  --   --   --  0.8 1.0 0.8  MONOABS   --   --   --  0.5 0.6 0.5  EOSABS  --   --   --  0.0 0.1 0.1  BASOSABS  --   --   --  0.0 0.0 0.0    Recent Labs  Lab 06/30/23 0344 07/01/23 0355 07/01/23 0355 07/02/23 0400 07/03/23 0531 07/04/23 0722 07/05/23 0548 07/06/23 0355  NA  --  136   < > 137 134* 133* 134* 134*  K  --  3.8   < > 3.7 3.7 3.6 3.8 3.8  CL  --  104   < > 104 102 101 100 101  CO2  --  26   < > 25 25 25 26 27   ANIONGAP  --  6   < > 8 7 7 8 6   GLUCOSE  --  109*   < > 96 95 133* 132* 132*  BUN  --  33*   < > 24* 16 12 10 12   CREATININE  --  0.79   < > 0.73 0.83 0.96 0.71 0.73  CRP  --   --   --   --   --  5.9* 4.0* 2.7*  INR  --   --   --  1.1  --   --   --   --   BNP  --   --   --   --   --  94.8 66.1 92.1  MG 2.1 2.0  --   --   --  1.7 1.8 1.9  CALCIUM  --  8.2*   < > 8.1* 7.9* 7.8* 7.9* 8.0*   < > = values in this interval not displayed.    Lab Results  Component Value Date   CHOL 168 03/20/2020   HDL 80 03/20/2020   LDLCALC 58 03/20/2020   TRIG 189 (H) 03/20/2020   CHOLHDL 2.1 03/20/2020      Recent Labs  Lab 06/30/23 0344 07/01/23 0355 07/01/23 0355 07/02/23 0400 07/03/23 0531 07/04/23 0722 07/05/23 0548 07/06/23 0355  CRP  --   --   --   --   --  5.9* 4.0* 2.7*  INR  --   --   --  1.1  --   --   --   --   BNP  --   --   --   --   --  94.8 66.1 92.1  MG 2.1 2.0  --   --   --  1.7 1.8 1.9  CALCIUM  --  8.2*   < > 8.1* 7.9* 7.8* 7.9* 8.0*   < > = values in this interval not displayed.    No results for input(s): "TSH", "T4TOTAL", "FREET4", "T3FREE", "THYROIDAB" in the last 72 hours.  No results for input(s): "VITAMINB12", "FOLATE", "FERRITIN", "TIBC", "  IRON", "RETICCTPCT" in the last 72 hours.   RADIOLOGY STUDIES/RESULTS: DG Chest Port 1 View  Result Date: 07/05/2023 CLINICAL DATA:  Shortness of breath. Ascending aortic aneurysm repair. EXAM: PORTABLE CHEST 1 VIEW COMPARISON:  06/29/2023 FINDINGS: Cardiac enlargement. Aortic tortuosity with stent graft. Tracheostomy tube. There  is no edema, consolidation, effusion, or pneumothorax. IMPRESSION: No new or active finding. Electronically Signed   By: Tiburcio Pea M.D.   On: 07/05/2023 07:09     LOS: 15 days   Signature  -    Susa Raring M.D on 07/06/2023 at 10:15 AM   -  To page go to [email or web address]

## 2023-07-06 NOTE — Plan of Care (Signed)
  Problem: Education: Goal: Knowledge about tracheostomy care/management will improve Outcome: Progressing   Problem: Activity: Goal: Ability to tolerate increased activity will improve Outcome: Progressing   Problem: Health Behavior/Discharge Planning: Goal: Ability to manage tracheostomy will improve Outcome: Progressing   Problem: Respiratory: Goal: Patent airway maintenance will improve Outcome: Progressing   Problem: Role Relationship: Goal: Ability to communicate will improve Outcome: Progressing   Problem: Education: Goal: Knowledge of General Education information will improve Description: Including pain rating scale, medication(s)/side effects and non-pharmacologic comfort measures Outcome: Progressing   Problem: Health Behavior/Discharge Planning: Goal: Ability to manage health-related needs will improve Outcome: Progressing   Problem: Clinical Measurements: Goal: Ability to maintain clinical measurements within normal limits will improve Outcome: Progressing

## 2023-07-10 DIAGNOSIS — C329 Malignant neoplasm of larynx, unspecified: Secondary | ICD-10-CM | POA: Diagnosis not present

## 2023-07-11 DIAGNOSIS — C329 Malignant neoplasm of larynx, unspecified: Secondary | ICD-10-CM | POA: Diagnosis not present

## 2023-07-15 ENCOUNTER — Encounter (HOSPITAL_COMMUNITY): Payer: Self-pay | Admitting: Otolaryngology

## 2023-07-24 DIAGNOSIS — R1310 Dysphagia, unspecified: Secondary | ICD-10-CM | POA: Diagnosis not present

## 2023-07-24 DIAGNOSIS — C329 Malignant neoplasm of larynx, unspecified: Secondary | ICD-10-CM | POA: Diagnosis not present

## 2023-08-13 DIAGNOSIS — R490 Dysphonia: Secondary | ICD-10-CM | POA: Diagnosis not present

## 2023-08-13 DIAGNOSIS — C329 Malignant neoplasm of larynx, unspecified: Secondary | ICD-10-CM | POA: Diagnosis not present

## 2023-08-14 ENCOUNTER — Encounter: Payer: Self-pay | Admitting: Radiation Oncology

## 2023-08-14 DIAGNOSIS — Z9049 Acquired absence of other specified parts of digestive tract: Secondary | ICD-10-CM | POA: Diagnosis not present

## 2023-08-14 DIAGNOSIS — C329 Malignant neoplasm of larynx, unspecified: Secondary | ICD-10-CM

## 2023-08-14 DIAGNOSIS — R933 Abnormal findings on diagnostic imaging of other parts of digestive tract: Secondary | ICD-10-CM | POA: Diagnosis not present

## 2023-08-17 ENCOUNTER — Other Ambulatory Visit: Payer: Self-pay | Admitting: Radiation Oncology

## 2023-08-17 ENCOUNTER — Inpatient Hospital Stay
Admission: RE | Admit: 2023-08-17 | Discharge: 2023-08-17 | Disposition: A | Discharge status: Home / Self Care | Payer: Self-pay | Source: Ambulatory Visit | Attending: Radiation Oncology | Admitting: Radiation Oncology

## 2023-08-17 ENCOUNTER — Telehealth: Payer: Self-pay | Admitting: Radiation Oncology

## 2023-08-17 DIAGNOSIS — C329 Malignant neoplasm of larynx, unspecified: Secondary | ICD-10-CM

## 2023-08-17 NOTE — Telephone Encounter (Signed)
Unable to LVM to schedule CON with Dr. Basilio Cairo to first number, able to send SMS message. LVM to second number listed in contact information.

## 2023-08-18 ENCOUNTER — Telehealth: Payer: Self-pay | Admitting: Radiation Oncology

## 2023-08-18 NOTE — Telephone Encounter (Signed)
Both calls went straight to VM, LVM to schedule CON with Dr. Basilio Cairo

## 2023-08-19 ENCOUNTER — Telehealth: Payer: Self-pay | Admitting: Radiation Oncology

## 2023-08-19 NOTE — Telephone Encounter (Signed)
Finally was able to speak with pt's s/o through alternate number and schedule CON with Dr. Basilio Cairo.

## 2023-08-23 NOTE — Progress Notes (Incomplete)
Head and Neck Cancer Location of Tumor / Histology: Laryngeal Cancer     Nutrition Status Yes No Comments  Weight changes? []  []    Swallowing concerns? []  [x]  He is able to eat and drink.  PEG? [x]  []  G Tube in place   Referrals Yes No Comments  Social Work? []  []    Dentistry? [x]  []  Dr. Vanessa Barbara 09/07/2023  Swallowing therapy? [x]  []  Speech Pathology 10/01/2023  Nutrition? []  []    Med/Onc? []  []     Safety Issues Yes No Comments  Prior radiation? []  []    Pacemaker/ICD? []  []    Possible current pregnancy? []  []    Is the patient on methotrexate? []  []      Tobacco/Marijuana/Snuff/ETOH use:   Past/Anticipated interventions by otolaryngology, if any:  Dr. Smith Mince 08/13/2023   Past/Anticipated interventions by medical oncology, if any:      Current Complaints / other details:

## 2023-08-24 NOTE — Progress Notes (Incomplete)
Radiation Oncology         [phone] ________________________________  Initial Outpatient Consultation  Name: Garrett Fleming MRN: [id number]  Date: 08/25/2023  DOB: 1956/04/12  WU:[id number], [name], MD  Corey Skains, MD   REFERRING PHYSICIAN: Corey Skains, MD  DIAGNOSIS: No diagnosis found.   Cancer Staging  No matching staging information was found for the patient.  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 male who presented to the [facility] ED on 05/04/23 with c/o vocal hoarsnes and an enlarging neck mass x 2 days. Soft tissue neck CT performed at that time revealed a large ulcerated laryngeal mass with a possible fluid collection at thyrohyoid membrane, and mild enlargement of left upper cervical lymph nodes. He also had a chest CT performed which showed no evidence of pulmonary metastatic disease. ED course included IV Zosyn, Vancomycin and methylpred at [facility] prior to being transferred to [facility] for admission. He received a greater than 7 day course of both dexamethasone and Zosyn which he began upon admission.  An ultrasound guided biopsy was initially attempted by IR on 05/05/23, however the submitted specimen was insufficient. He was accordingly seen by ENT and underwent a repeat laryngeal biopsy in the OR on 05/08/23. Pathology revealed: invasive squamous cell carcinoma; moderately differentiated, arising in carcinoma in situ.   At discharge on 05/12/23, he was sent home with dexamethasone BID to take until he followed up with OP ENT.   Before he could establish OP care, the patient returned to the ED and was admitted on 06/21/23 due to respiratory distress. He required an emergent tracheostomy by ENT. His admission was complicated due to excessive  secretions from the trachea, likely related to the laryngeal mass, possible aspiration PNA, and an upper GI bleed resulting in acute blood loss anemia. The remainder of his hospital course consisted of an EDG on 07/03/23 which showed grade D esophagitis and a non-bleeding duodenal ulcer. Imaging performed while inpatient at [facility] included:  -- Chest CT which showed possible aspiration PNA as noted above, and evidence of pneumomediastinum/subcutaneous emphysema.  -- Repeat soft tissue neck CT showed the obstructing mass in the supraglottic larynx with extension to the inferior aspect of the thyroid cartilage.  -- Repeat CT AP again showed no abnormalities in the abdomen or pelvis.   Once a bed was available, he was then transferred to [facility] on 10/07 and proceeded to undergo a total laryngectomy, bilateral neck dissection, left pectoralis major musculocutaneous flap, and split-thickness skin graft from the left thigh on 07/09/2023. Pathology from the procedure showed: tumor the size of 3.9 cm; histology on invasive squamous cell carcinoma invading into the thyroid cartilage; negative for LVI or PNI; focal thyroid tissue negative for neoplasm; all margins negative for carcinoma; nodal status of 49/49 dissected cervical lymph nodes negative for carcinoma.   His immediate postoperative course was uncomplicated. However, his right neck drain began to appear more "milky", and there was concern for a fistulous appearance. On 10/15, his right neck incision was opened at bedside with fistula collection area probing. A wound VAC was placed on 10/18. He also underwent a complete odontectomy while inpatient on 10/23, which included 21 teeth and bilateral tori removal. During this time, his wound vac was removed and showed resolution of the previous fistula.  He was also seen by speech pathology, and discharged on 07/24/23 in stable condition.  -- A repeat soft tissue neck CT was performed on 10/08, prior to his  procedure, which showed interval progression of the locally invasive transglottic malignancy consistent with the biopsy-proven squamous cell carcinoma,and multiple prominent but subthreshold bilateral cervical level 2/3 lymph nodes, including a 9 mm long axis left level IIb node, decreased from 10 mm from prior CT. Overall, morphologically suspicious lymph nodes were demonstrated.   He recently established OP ENT care with Dr. Smith Mince, [facility], on 08/13/23. During that visit, the patient was noted to report not using his g-tube and eating a wide range of foods which he should not have been be doing. He also reported suffering a fall the day prior which resulted in his losing his laryngectomy tube. An esophogram was subsequently ordered which showed no leaks.    He has requested that he receive his oncologic care in [facility] and has been referred to [place] to discuss the role of adjuvant radiation therapy. A referral to our medical oncology department has also been placed.   Swallowing issues, if any: significant dysphagia - s/p : PEG tube placed by IR on 05/07/23; emergent tracheostomy by ENT on 06/21/23; total laryngectomy on 07/06/23  Weight Changes: ***  Pain status: ***  Other symptoms: presented with vocal hoarseness, dysphagia, and an enlarging neck mass which resulted in SOB when laying flat   Tobacco history, if any: recently quit smoking at the time of his diagnosis   ETOH abuse, if any: consumed alcohol on occasion  Substance abuse history: history of cocaine use   Prior cancers, if any: none   PREVIOUS RADIATION THERAPY: {EXAM; YES/NO:19492::"No"}  PAST MEDICAL HISTORY:  has a past medical history of Ankle fracture, left, Arthritis, Ascending aortic aneurysm (HCC) (01/21/2019), Blind right eye, Cocaine abuse (HCC) (11/17/2018), Coronary artery calcification seen on CAT scan (01/21/2019), Discomfort in chest, Essential hypertension (11/17/2018), Fracture of lateral malleolus of  left ankle (12/02/2013), Keloid (11/16/2018), Penetrating atherosclerotic ulcer of aorta (HCC) (11/16/2018), and Smoking (11/17/2018).    PAST SURGICAL HISTORY: Past Surgical History:  Procedure Laterality Date   BIOPSY  07/03/2023   Procedure: BIOPSY;  Surgeon: Shellia Cleverly, DO;  Location: [facility];  Service: Gastroenterology;;   CAROTID-SUBCLAVIAN BYPASS GRAFT Left 11/26/2018   Procedure: LEFT CAROTID TO SUBCLAVIAN ARTERY TRANSPOSITION;  Surgeon: Nada Libman, MD;  Location: [facility] OR;  Service: Vascular;  Laterality: Left;   ESOPHAGOGASTRODUODENOSCOPY (EGD) WITH PROPOFOL N/A 07/03/2023   Procedure: ESOPHAGOGASTRODUODENOSCOPY (EGD) WITH PROPOFOL;  Surgeon: Shellia Cleverly, DO;  Location: [facility];  Service: Gastroenterology;  Laterality: N/A;   KENALOG INJECTION Right 04/25/2020   Procedure: kenalog injection to right shoulder keloid;  Surgeon: Peggye Form, DO;  Location: [facility];  Service: Plastics;  Laterality: Right;   LESION REMOVAL Left 04/25/2020   Procedure: Excision of keloid/changing skin lesion of scalp;  Surgeon: Peggye Form, DO;  Location: [facility];  Service: Plastics;  Laterality: Left;  1 hour total, please   ORIF ANKLE FRACTURE Left 12/02/2013   Procedure: OPEN REDUCTION INTERNAL FIXATION (ORIF) LEFT ANKLE FRACTURE;  Surgeon: Kathryne Hitch, MD;  Location: [facility];  Service: Orthopedics;  Laterality: Left;   right eye surgery   1977   THORACIC AORTIC ENDOVASCULAR STENT GRAFT N/A 11/26/2018   Procedure: THORACIC AORTIC ENDOVASCULAR STENT using a GORE TAG CONFORMABLE THORACIC GRAFT;  Surgeon: Nada Libman, MD;  Location: [facility]  OR;  Service: Vascular;  Laterality: N/A;   TRACHEOSTOMY TUBE PLACEMENT N/A 06/21/2023   Procedure: TRACHEOSTOMY;  Surgeon: Scarlette Ar, MD;  Location: [facility] OR;  Service: ENT;  Laterality: N/A;   ULTRASOUND GUIDANCE FOR VASCULAR ACCESS Bilateral 11/26/2018   Procedure: Ultrasound Guidance For  Vascular Access;  Surgeon: Nada Libman, MD;  Location: [facility] OR;  Service: Vascular;  Laterality: Bilateral;    FAMILY HISTORY: family history is not on file.  SOCIAL HISTORY:  reports that he has been smoking cigarettes. He has a 5 pack-year smoking history. He has never used smokeless tobacco. He reports current alcohol use. He reports current drug use. Drugs: Cocaine and Marijuana.  ALLERGIES: Lisinopril  MEDICATIONS:  Current Outpatient Medications  Medication Sig Dispense Refill   Nutritional Supplements (FEEDING SUPPLEMENT, OSMOLITE 1.5 CAL,) LIQD Place 1,000 mLs into feeding tube continuous.     oxyCODONE (ROXICODONE) 5 MG/5ML solution Place 5 mg into feeding tube every 4 (four) hours as needed for moderate pain or severe pain.     pantoprazole (PROTONIX) 40 MG injection Inject 40 mg into the vein every 12 (twelve) hours.     Protein (FEEDING SUPPLEMENT, PROSOURCE TF20,) liquid Place 60 mLs into feeding tube daily.     scopolamine (TRANSDERM-SCOP) 1 MG/3DAYS Place 1 patch (1.5 mg total) onto the skin every 3 (three) days.     No current facility-administered medications for this encounter.    REVIEW OF SYSTEMS:  Notable for that above.   PHYSICAL EXAM:  vitals were not taken for this visit.   General: Alert and oriented, in no acute distress HEENT: Head is normocephalic. Extraocular movements are intact. Oropharynx is notable for ***. Neck: Neck is notable for *** Heart: Regular in rate and rhythm with no murmurs, rubs, or gallops. Chest: Clear to auscultation bilaterally, with no rhonchi, wheezes, or rales. Abdomen: Soft, nontender, nondistended, with no rigidity or guarding. Extremities: No cyanosis or edema. Lymphatics: see Neck Exam Skin: No concerning lesions. Musculoskeletal: symmetric strength and muscle tone throughout. Neurologic: Cranial nerves II through XII are grossly intact. No obvious focalities. Speech is fluent. Coordination is intact. Psychiatric:  Judgment and insight are intact. Affect is appropriate.   ECOG = ***  0 - Asymptomatic (Fully active, able to carry on all predisease activities without restriction)  1 - Symptomatic but completely ambulatory (Restricted in physically strenuous activity but ambulatory and able to carry out work of a light or sedentary nature. For example, light housework, office work)  2 - Symptomatic, <50% in bed during the day (Ambulatory and capable of all self care but unable to carry out any work activities. Up and about more than 50% of waking hours)  3 - Symptomatic, >50% in bed, but not bedbound (Capable of only limited self-care, confined to bed or chair 50% or more of waking hours)  4 - Bedbound (Completely disabled. Cannot carry on any self-care. Totally confined to bed or chair)  5 - Death   Santiago Glad MM, Creech RH, Tormey DC, et al. [phone]). "Toxicity and response criteria of the [facility]". Am. Evlyn Clines. Oncol. 5 (6): 649-55   LABORATORY DATA:  Lab Results  Component Value Date   WBC 5.4 07/06/2023   HGB 7.4 (L) 07/06/2023   HCT 22.6 (L) 07/06/2023   MCV 97.8 07/06/2023   PLT 290 07/06/2023   CMP     Component Value Date/Time   NA 134 (L) 07/06/2023 0355   NA 142 03/20/2020 1427   K 3.8  07/06/2023 0355   CL 101 07/06/2023 0355   CO2 27 07/06/2023 0355   GLUCOSE 132 (H) 07/06/2023 0355   BUN 12 07/06/2023 0355   BUN 18 03/20/2020 1427   CREATININE 0.73 07/06/2023 0355   CALCIUM 8.0 (L) 07/06/2023 0355   PROT 6.1 (L) 06/23/2023 0509   PROT 6.7 03/20/2020 1427   ALBUMIN 2.9 (L) 06/23/2023 0509   ALBUMIN 4.3 03/20/2020 1427   AST 20 06/23/2023 0509   ALT 26 06/23/2023 0509   ALKPHOS 71 06/23/2023 0509   BILITOT 1.0 06/23/2023 0509   BILITOT 0.5 03/20/2020 1427   GFRNONAA >60 07/06/2023 0355   GFRAA >60 04/25/2020 1010      Lab Results  Component Value Date   TSH 0.244 (L) 05/04/2023     RADIOGRAPHY: No results found.    IMPRESSION/PLAN:  This  is a delightful patient with head and neck cancer. I *** recommend radiotherapy for this patient.  We discussed the potential risks, benefits, and side effects of radiotherapy. We talked in detail about acute and late effects. We discussed that some of the most bothersome acute effects may be mucositis, dysgeusia, salivary changes, skin irritation, hair loss, dehydration, weight loss and fatigue. We talked about late effects which include but are not necessarily limited to dysphagia, hypothyroidism, nerve injury, vascular injury, spinal cord injury, xerostomia, trismus, neck edema, dental issues, non-healing wound, and potentially fatal injury to any of the tissues in the head and neck region. No guarantees of treatment were given. A consent form was signed and placed in the patient's medical record. The patient is enthusiastic about proceeding with treatment. I look forward to participating in the patient's care.    Simulation (treatment planning) will take place ***  We also discussed that the treatment of head and neck cancer is a multidisciplinary process to maximize treatment outcomes and quality of life. For this reason the following referrals have been or will be made:  *** Medical oncology to discuss chemotherapy   *** Dentistry for dental evaluation, possible extractions in the radiation fields, and /or advice on reducing risk of cavities, osteoradionecrosis, or other oral issues.  *** Nutritionist for nutrition support during and after treatment.  *** Speech language pathology for swallowing and/or speech therapy.  *** Social work for social support.   *** Physical therapy due to risk of lymphedema in neck and deconditioning.  *** Baseline labs including TSH.  On date of service, in total, I spent *** minutes on this encounter. Patient was seen in person.  __________________________________________   Lonie Peak, MD  This document serves as a record of services personally  performed by Lonie Peak, MD. It was created on her behalf by Neena Rhymes, a trained medical scribe. The creation of this record is based on the scribe's personal observations and the provider's statements to them. This document has been checked and approved by the attending provider.

## 2023-08-25 ENCOUNTER — Ambulatory Visit: Payer: Medicare HMO

## 2023-08-25 ENCOUNTER — Ambulatory Visit
Admission: RE | Admit: 2023-08-25 | Discharge: 2023-08-25 | Disposition: A | Discharge status: Home / Self Care | Payer: Medicare HMO | Source: Ambulatory Visit | Attending: Radiation Oncology | Admitting: Radiation Oncology

## 2023-08-25 DIAGNOSIS — C329 Malignant neoplasm of larynx, unspecified: Secondary | ICD-10-CM

## 2023-08-31 NOTE — Progress Notes (Incomplete)
Radiation Oncology         [phone] ________________________________  Initial Outpatient Consultation  Name: Garrett Fleming MRN: [id number]  Date: 09/01/2023  DOB: 1956-03-17  ZD:[id number], [name], MD  Corey Skains, MD   REFERRING PHYSICIAN: Corey Skains, MD  DIAGNOSIS:  No diagnosis found.    Cancer Staging  Squamous cell carcinoma of larynx (HCC) Staging form: Larynx - Supraglottis, AJCC 8th Edition - Pathologic stage from 08/25/2023: Stage IVA (pT4a, pN0, cM0) - Unsigned Stage prefix: Initial diagnosis ***  Squamous cell carcinoma of the larynx; s/p total laryngectomy, bilateral neck dissection, left pectoralis major flap, and split-thickness skin graft   CHIEF COMPLAINT: Here to discuss management of laryngeal cancer  HISTORY OF PRESENT ILLNESS::Garrett Fleming is a 67 y.o. male who presented to the [facility] ED on 05/04/23 with c/o vocal hoarsnes and an enlarging neck mass x 2 days. Soft tissue neck CT performed at that time revealed a large ulcerated laryngeal mass with a possible fluid collection at thyrohyoid membrane, and mild enlargement of left upper cervical lymph nodes. He also had a chest CT performed which showed no evidence of pulmonary metastatic disease. ED course included IV Zosyn, Vancomycin and methylpred at [facility] prior to being transferred to [facility] for admission. He received a greater than 7 day course of both dexamethasone and Zosyn which he began upon admission.  An ultrasound guided biopsy was initially attempted by IR on 05/05/23, however the submitted specimen was insufficient. He was accordingly seen by ENT and underwent a repeat laryngeal biopsy in the OR on 05/08/23. Pathology revealed: invasive squamous cell carcinoma; moderately differentiated, arising in carcinoma in situ.   At discharge on 05/12/23, he was sent home with dexamethasone BID to take until he followed up with OP ENT.   Before he could establish OP care, the patient returned to the  ED and was admitted on 06/21/23 due to respiratory distress. He required an emergent tracheostomy by ENT. His admission was complicated due to excessive secretions from the trachea, likely related to the laryngeal mass, possible aspiration PNA, and an upper GI bleed resulting in acute blood loss anemia. The remainder of his hospital course consisted of an EDG on 07/03/23 which showed grade D esophagitis and a non-bleeding duodenal ulcer. Imaging performed while inpatient at [facility] included:  -- Chest CT which showed possible aspiration PNA as noted above, and evidence of pneumomediastinum/subcutaneous emphysema.  -- Repeat soft tissue neck CT showed the obstructing mass in the supraglottic larynx with extension to the inferior aspect of the thyroid cartilage.  -- Repeat CT AP again showed no abnormalities in the abdomen or pelvis.   Once a bed was available, he was then transferred to [facility] on 10/07 and proceeded to undergo a total laryngectomy, bilateral neck dissection, left pectoralis major musculocutaneous flap, and split-thickness skin graft from the left thigh on 07/09/2023. Pathology from the procedure showed: tumor the size of 3.9 cm; histology on invasive squamous cell carcinoma invading into the thyroid cartilage; negative for LVI or PNI; focal thyroid tissue negative for neoplasm; all margins negative for carcinoma; nodal status of 49/49 dissected cervical lymph nodes negative for carcinoma.   His immediate postoperative course was uncomplicated. However, his right neck drain began to appear more "milky", and there was concern for a fistulous appearance. On 10/15, his right neck incision was opened at bedside with fistula collection area probing. A wound VAC was placed on 10/18. He also underwent a complete odontectomy while inpatient on  10/23, which included 21 teeth and bilateral tori removal. During this time, his wound vac was removed and showed resolution of the previous fistula. He was  also seen by speech pathology, and discharged on 07/24/23 in stable condition.  -- A repeat soft tissue neck CT was performed on 10/08, prior to his procedure, which showed interval progression of the locally invasive transglottic malignancy consistent with the biopsy-proven squamous cell carcinoma,and multiple prominent but subthreshold bilateral cervical level 2/3 lymph nodes, including a 9 mm long axis left level IIb node, decreased from 10 mm from prior CT. Overall, morphologically suspicious lymph nodes were demonstrated.   He recently established OP ENT care with Dr. Smith Mince, [facility], on 08/13/23. During that visit, the patient was noted to report not using his g-tube and eating a wide range of foods which he should not have been be doing. He also reported suffering a fall the day prior which resulted in his losing his laryngectomy tube. An esophogram was subsequently ordered which showed no leaks.    He has requested that he receive his oncologic care in [facility] and has been referred to [place] to discuss the role of adjuvant radiation therapy. A referral to our medical oncology department has also been placed.   Swallowing issues, if any: significant dysphagia - s/p : PEG tube placed by IR on 05/07/23; emergent tracheostomy by ENT on 06/21/23; total laryngectomy on 07/06/23  Weight Changes: ***  Pain status: ***  Other symptoms: presented with vocal hoarseness, dysphagia, and an enlarging neck mass which resulted in SOB when laying flat   Tobacco history, if any: recently quit smoking at the time of his diagnosis   ETOH abuse, if any: consumed alcohol on occasion  Substance abuse history: history of cocaine use   Prior cancers, if any: none   PREVIOUS RADIATION THERAPY: {EXAM; YES/NO:19492::"No"}  PAST MEDICAL HISTORY:  has a past medical history of Ankle fracture, left, Arthritis, Ascending aortic aneurysm (HCC) (01/21/2019), Blind right eye, Cocaine abuse (HCC) (11/17/2018),  Coronary artery calcification seen on CAT scan (01/21/2019), Discomfort in chest, Essential hypertension (11/17/2018), Fracture of lateral malleolus of left ankle (12/02/2013), Keloid (11/16/2018), Penetrating atherosclerotic ulcer of aorta (HCC) (11/16/2018), and Smoking (11/17/2018).    PAST SURGICAL HISTORY: Past Surgical History:  Procedure Laterality Date   BIOPSY  07/03/2023   Procedure: BIOPSY;  Surgeon: Shellia Cleverly, DO;  Location: [facility];  Service: Gastroenterology;;   CAROTID-SUBCLAVIAN BYPASS GRAFT Left 11/26/2018   Procedure: LEFT CAROTID TO SUBCLAVIAN ARTERY TRANSPOSITION;  Surgeon: Nada Libman, MD;  Location: [facility] OR;  Service: Vascular;  Laterality: Left;   ESOPHAGOGASTRODUODENOSCOPY (EGD) WITH PROPOFOL N/A 07/03/2023   Procedure: ESOPHAGOGASTRODUODENOSCOPY (EGD) WITH PROPOFOL;  Surgeon: Shellia Cleverly, DO;  Location: [facility];  Service: Gastroenterology;  Laterality: N/A;   KENALOG INJECTION Right 04/25/2020   Procedure: kenalog injection to right shoulder keloid;  Surgeon: Peggye Form, DO;  Location: [facility];  Service: Plastics;  Laterality: Right;   LESION REMOVAL Left 04/25/2020   Procedure: Excision of keloid/changing skin lesion of scalp;  Surgeon: Peggye Form, DO;  Location: [facility];  Service: Plastics;  Laterality: Left;  1 hour total, please   ORIF ANKLE FRACTURE Left 12/02/2013   Procedure: OPEN REDUCTION INTERNAL FIXATION (ORIF) LEFT ANKLE FRACTURE;  Surgeon: Kathryne Hitch, MD;  Location: [facility];  Service: Orthopedics;  Laterality: Left;   right eye surgery   1977   THORACIC AORTIC ENDOVASCULAR STENT GRAFT N/A  11/26/2018   Procedure: THORACIC AORTIC ENDOVASCULAR STENT using a GORE TAG CONFORMABLE THORACIC GRAFT;  Surgeon: Nada Libman, MD;  Location: [facility] OR;  Service: Vascular;  Laterality: N/A;   TRACHEOSTOMY TUBE PLACEMENT N/A 06/21/2023   Procedure: TRACHEOSTOMY;  Surgeon: Scarlette Ar, MD;   Location: [facility] OR;  Service: ENT;  Laterality: N/A;   ULTRASOUND GUIDANCE FOR VASCULAR ACCESS Bilateral 11/26/2018   Procedure: Ultrasound Guidance For Vascular Access;  Surgeon: Nada Libman, MD;  Location: [facility] OR;  Service: Vascular;  Laterality: Bilateral;    FAMILY HISTORY: family history is not on file.  SOCIAL HISTORY:  reports that he has been smoking cigarettes. He has a 5 pack-year smoking history. He has never used smokeless tobacco. He reports current alcohol use. He reports current drug use. Drugs: Cocaine and Marijuana.  ALLERGIES: Lisinopril  MEDICATIONS:  Current Outpatient Medications  Medication Sig Dispense Refill   Nutritional Supplements (FEEDING SUPPLEMENT, OSMOLITE 1.5 CAL,) LIQD Place 1,000 mLs into feeding tube continuous.     oxyCODONE (ROXICODONE) 5 MG/5ML solution Place 5 mg into feeding tube every 4 (four) hours as needed for moderate pain or severe pain.     pantoprazole (PROTONIX) 40 MG injection Inject 40 mg into the vein every 12 (twelve) hours.     Protein (FEEDING SUPPLEMENT, PROSOURCE TF20,) liquid Place 60 mLs into feeding tube daily.     scopolamine (TRANSDERM-SCOP) 1 MG/3DAYS Place 1 patch (1.5 mg total) onto the skin every 3 (three) days.     No current facility-administered medications for this encounter.    REVIEW OF SYSTEMS:  Notable for that above.   PHYSICAL EXAM:  vitals were not taken for this visit.   General: Alert and oriented, in no acute distress HEENT: Head is normocephalic. Extraocular movements are intact. Oropharynx is notable for ***. Neck: Neck is notable for *** Heart: Regular in rate and rhythm with no murmurs, rubs, or gallops. Chest: Clear to auscultation bilaterally, with no rhonchi, wheezes, or rales. Abdomen: Soft, nontender, nondistended, with no rigidity or guarding. Extremities: No cyanosis or edema. Lymphatics: see Neck Exam Skin: No concerning lesions. Musculoskeletal: symmetric strength and muscle tone  throughout. Neurologic: Cranial nerves II through XII are grossly intact. No obvious focalities. Speech is fluent. Coordination is intact. Psychiatric: Judgment and insight are intact. Affect is appropriate.   ECOG = ***  0 - Asymptomatic (Fully active, able to carry on all predisease activities without restriction)  1 - Symptomatic but completely ambulatory (Restricted in physically strenuous activity but ambulatory and able to carry out work of a light or sedentary nature. For example, light housework, office work)  2 - Symptomatic, <50% in bed during the day (Ambulatory and capable of all self care but unable to carry out any work activities. Up and about more than 50% of waking hours)  3 - Symptomatic, >50% in bed, but not bedbound (Capable of only limited self-care, confined to bed or chair 50% or more of waking hours)  4 - Bedbound (Completely disabled. Cannot carry on any self-care. Totally confined to bed or chair)  5 - Death   Santiago Glad MM, Creech RH, Tormey DC, et al. [phone]). "Toxicity and response criteria of the [facility]". Am. Evlyn Clines. Oncol. 5 (6): 649-55   LABORATORY DATA:  Lab Results  Component Value Date   WBC 5.4 07/06/2023   HGB 7.4 (L) 07/06/2023   HCT 22.6 (L) 07/06/2023   MCV 97.8 07/06/2023   PLT 290 07/06/2023   CMP  Component Value Date/Time   NA 134 (L) 07/06/2023 0355   NA 142 03/20/2020 1427   K 3.8 07/06/2023 0355   CL 101 07/06/2023 0355   CO2 27 07/06/2023 0355   GLUCOSE 132 (H) 07/06/2023 0355   BUN 12 07/06/2023 0355   BUN 18 03/20/2020 1427   CREATININE 0.73 07/06/2023 0355   CALCIUM 8.0 (L) 07/06/2023 0355   PROT 6.1 (L) 06/23/2023 0509   PROT 6.7 03/20/2020 1427   ALBUMIN 2.9 (L) 06/23/2023 0509   ALBUMIN 4.3 03/20/2020 1427   AST 20 06/23/2023 0509   ALT 26 06/23/2023 0509   ALKPHOS 71 06/23/2023 0509   BILITOT 1.0 06/23/2023 0509   BILITOT 0.5 03/20/2020 1427   GFRNONAA >60 07/06/2023 0355   GFRAA >60  04/25/2020 1010      Lab Results  Component Value Date   TSH 0.244 (L) 05/04/2023     RADIOGRAPHY: No results found.    IMPRESSION/PLAN:  This is a delightful patient with head and neck cancer. I *** recommend radiotherapy for this patient.  We discussed the potential risks, benefits, and side effects of radiotherapy. We talked in detail about acute and late effects. We discussed that some of the most bothersome acute effects may be mucositis, dysgeusia, salivary changes, skin irritation, hair loss, dehydration, weight loss and fatigue. We talked about late effects which include but are not necessarily limited to dysphagia, hypothyroidism, nerve injury, vascular injury, spinal cord injury, xerostomia, trismus, neck edema, dental issues, non-healing wound, and potentially fatal injury to any of the tissues in the head and neck region. No guarantees of treatment were given. A consent form was signed and placed in the patient's medical record. The patient is enthusiastic about proceeding with treatment. I look forward to participating in the patient's care.    Simulation (treatment planning) will take place ***  We also discussed that the treatment of head and neck cancer is a multidisciplinary process to maximize treatment outcomes and quality of life. For this reason the following referrals have been or will be made:  *** Medical oncology to discuss chemotherapy   *** Dentistry for dental evaluation, possible extractions in the radiation fields, and /or advice on reducing risk of cavities, osteoradionecrosis, or other oral issues.  *** Nutritionist for nutrition support during and after treatment.  *** Speech language pathology for swallowing and/or speech therapy.  *** Social work for social support.   *** Physical therapy due to risk of lymphedema in neck and deconditioning.  *** Baseline labs including TSH.  On date of service, in total, I spent *** minutes on this encounter.  Patient was seen in person.  __________________________________________    Bryan Lemma, PA-C   Lonie Peak, MD    [facility]  Radiation Oncology Direct Dial: [phone]  Fax: [phone] [email or web address]   This document serves as a record of services personally performed by Lonie Peak, MD and Bryan Lemma, PA-C. It was created on her behalf by Neena Rhymes, a trained medical scribe. The creation of this record is based on the scribe's personal observations and the provider's statements to them. This document has been checked and approved by the attending provider.

## 2023-09-01 ENCOUNTER — Ambulatory Visit
Admission: RE | Admit: 2023-09-01 | Discharge: 2023-09-01 | Disposition: A | Discharge status: Home / Self Care | Payer: Medicare HMO | Source: Ambulatory Visit | Attending: Radiation Oncology | Admitting: Radiation Oncology

## 2023-09-01 DIAGNOSIS — C329 Malignant neoplasm of larynx, unspecified: Secondary | ICD-10-CM

## 2023-09-01 NOTE — Progress Notes (Signed)
Patient spouse states that he would like to reschedule his appointment . Rn will notify provider  and scheduler.

## 2023-09-02 NOTE — Progress Notes (Signed)
Oncology Nurse Navigator Documentation   I attempted an introductory phone call with Mr. Selig and his significant other. I left a VM with my direct contact information and details of his appointments here at [facility] on 09/04/23.  Hedda Slade RN, BSN, OCN Head & Neck Oncology Nurse Navigator [facility] Phone # [phone]  Fax # [phone]

## 2023-09-03 NOTE — Progress Notes (Signed)
Radiation Oncology         [phone] ________________________________  Initial Outpatient Consultation  Name: Garrett Fleming MRN: [id number]  Date: 09/04/2023  DOB: 1956-06-07  VH:QIONGE, [name], MD  Corey Skains, MD   REFERRING PHYSICIAN: Corey Skains, MD  DIAGNOSIS:    ICD-10-CM   1. Malignant neoplasm of glottis [facility])  C32.0         Cancer Staging  Squamous cell carcinoma of larynx (HCC) Staging form: Larynx - Glottis, AJCC 8th Edition - Pathologic stage from 09/04/2023: Stage IVA (pT4a, pN0, cM0) - Signed by Lonie Peak, MD on 09/04/2023 Stage prefix: Initial diagnosis   Squamous cell carcinoma of the larynx; s/p total laryngectomy, bilateral neck dissection, left pectoralis major flap, and split-thickness skin graft   CHIEF COMPLAINT: Here to discuss management of laryngeal cancer  HISTORY OF PRESENT ILLNESS::Garrett Fleming is a 67 y.o. male who presented to the [facility] ED on 05/04/23 with c/o vocal hoarsnes and an enlarging neck mass x 2 days. Soft tissue neck CT performed at that time revealed a large ulcerated laryngeal mass with a possible fluid collection at thyrohyoid membrane, and mild enlargement of left upper cervical lymph nodes. He also had a chest CT performed which showed no evidence of pulmonary metastatic disease. ED course included IV Zosyn, Vancomycin and methylpred at [facility] prior to being transferred to [facility] for admission. He received a greater than 7 day course of both dexamethasone and Zosyn which he began upon admission.  An ultrasound guided biopsy was initially attempted by IR on 05/05/23, however the submitted specimen was insufficient. He was accordingly seen by ENT and underwent a repeat laryngeal biopsy in the OR on 05/08/23. Pathology revealed: invasive squamous cell carcinoma; moderately differentiated, arising in carcinoma in situ.   At discharge on 05/12/23, he was sent home with dexamethasone BID to take until he followed up with OP  ENT.   Before he could establish OP care, the patient returned to the ED and was admitted on 06/21/23 due to respiratory distress. He required an emergent tracheostomy by ENT. His admission was complicated due to excessive secretions from the trachea, likely related to the laryngeal mass, possible aspiration PNA, and an upper GI bleed resulting in acute blood loss anemia. The remainder of his hospital course consisted of an EDG on 07/03/23 which showed grade D esophagitis and a non-bleeding duodenal ulcer. Imaging performed while inpatient at [facility] included:  -- Chest CT which showed possible aspiration PNA as noted above, and evidence of pneumomediastinum/subcutaneous emphysema.  -- Repeat soft tissue neck CT showed the obstructing mass in the supraglottic larynx with extension to the inferior aspect of the thyroid cartilage.  -- Repeat CT AP again showed no abnormalities in the abdomen or pelvis.   Once a bed was available, he was then transferred to [facility] on 10/07 and proceeded to undergo a total laryngectomy, bilateral neck dissection, left pectoralis major musculocutaneous flap, and split-thickness skin graft from the left thigh on 07/09/2023. Pathology from the procedure showed: tumor the size of 3.9 cm; histology on invasive squamous cell carcinoma invading into the thyroid cartilage; negative for LVI or PNI; focal thyroid tissue negative for neoplasm; all margins negative for carcinoma; nodal status of 49/49 dissected cervical lymph nodes negative for carcinoma.   His immediate postoperative course was uncomplicated. However, his right neck drain began to appear more "milky", and there was concern for a fistulous appearance. On 10/15, his right neck incision was opened at bedside  with fistula collection area probing. A wound VAC was placed on 10/18. He also underwent a complete odontectomy while inpatient on 10/23, which included 21 teeth and bilateral tori removal. During this time, his  wound vac was removed and showed resolution of the previous fistula. He was also seen by speech pathology, and discharged on 07/24/23 in stable condition.  -- A repeat soft tissue neck CT was performed on 10/08, prior to his procedure, which showed interval progression of the locally invasive transglottic malignancy consistent with the biopsy-proven squamous cell carcinoma,and multiple prominent but subthreshold bilateral cervical level 2/3 lymph nodes, including a 9 mm long axis left level IIb node, decreased from 10 mm from prior CT. Overall, morphologically suspicious lymph nodes were demonstrated.   He recently established OP ENT care with Dr. Smith Mince, [facility], on 08/13/23. During that visit, the patient was noted to report not using his g-tube and eating a wide range of foods which he should not have been be doing. He also reported suffering a fall the day prior which resulted in his losing his laryngectomy tube. An esophogram was subsequently ordered which showed no leaks.    He has requested that he receive his oncologic care in [facility] and has been referred to [place] to discuss the role of adjuvant radiation therapy. A referral to our medical oncology department has also been placed.   Swallowing issues, if any: significant dysphagia - s/p : PEG tube placed by IR on 05/07/23; emergent tracheostomy by ENT on 06/21/23; total laryngectomy on 07/06/23  Weight Changes:  Wt Readings from Last 3 Encounters:  09/04/23 158 lb 6 oz (71.8 kg)  07/05/23 130 lb 11.7 oz (59.3 kg)  05/04/23 169 lb (76.7 kg)   Other symptoms: presented with vocal hoarseness, dysphagia, and an enlarging neck mass which resulted in SOB when laying flat   Tobacco history, if any: recently quit smoking at the time of his diagnosis and denies any vaping or marijuana use at this time  ETOH abuse, if any: consumed alcohol on occasion but states no recent use  Substance abuse history: history of cocaine use   Prior  cancers, if any: none  07-09-23 PATHOLOGY REPORT:  A. RIGHT NECK CONTENTS, LEVELS 2A, 3, AND 4, DISSECTION:              Sixteen (16) lymph nodes are negative for carcinoma (0/16).              Some lymph nodes with reactive lymphoid hyperplasia.    B. RIGHT NECK CONTENTS, LEVEL 2B, DISSECTION:              Four (4) lymph nodes are negative for carcinoma (0/4).                C. LEFT INFERIOR STERNOCLEIDOMASTOID, EXCISION: Skeletal muscle and fibroadipose tissue with patchy inflammation, fat necrosis, granulation tissue, and multinucleated giant cell reaction.  No malignancy identified.    D. LEFT NECK CONTENTS, LEVELS 2A, 3, AND 4, DISSECTION:              Twenty-six (26) lymph nodes are negative for carcinoma (0/26).              Some lymph nodes with reactive lymphoid hyperplasia.    E. PARATRACHEAL LYMPH NODES, EXCISION:              Three (3) lymph nodes are negative for carcinoma (0/3).  Parathyroid tissue, focal.   F. LARYNX, LARYNGECTOMY:              Invasive squamous cell carcinoma, moderately differentiated.  Tumor is 3.9 cm in greatest dimension with transglottic extension.  Tumor invades into the thyroid cartilage.                           No lymphatic or perineural invasion identified.                            Focal thyroid tissue negative for neoplasm.                            Surgical margins are negative for carcinoma.                            Pathologic stage (AJCC 8th Edition): pT4a, pN0                            See CAP synoptic.                               Electronically signed by Jamie Brookes, MD on 07/15/2023 at  1:28 PM  Synoptic Checklist LARYNX (SUPRAGLOTTIS, GLOTTIS, SUBGLOTTIS) LARYNX (SUPRAGLOTTIS, GLOTTIS, SUBGLOTTIS) - All Specimens 8th Edition - Protocol posted: 03/19/2022  SPECIMEN    Procedure:    Total laryngectomy  TUMOR    Tumor Focality:    Unifocal    Tumor Site:    Larynx, glottis      Tumor Subsite:    True vocal  cord      Tumor Subsite:    Anterior commissure    Transglottic Extension:    Present    Tumor Laterality:    Right    Tumor Laterality:    Left    Tumor Laterality:    Midline    Tumor Size:    Greatest Dimension (Centimeters): 3.9 cm    Histologic Type:          :    Squamous cell carcinoma, conventional (keratinizing)    Histologic Grade:    G2, moderately differentiated    Lymphatic and / or Vascular Invasion:    Not identified    Perineural Invasion:    Not identified  MARGINS    Margin Status for Invasive Tumor:    All margins negative for invasive tumor      Distance from Invasive Tumor to Closest Margin:    10 mm      Closest Margin(s) to Invasive Tumor:    Left anterior soft tissue    Margin Status for Noninvasive Tumor (High-grade Dysplasia):    All margins negative for noninvasive tumor  REGIONAL LYMPH NODES    Regional Lymph Node Status:          :    All regional lymph nodes negative for tumor      Number of Lymph Nodes Examined:    49  pTNM CLASSIFICATION (AJCC 8th Edition)    Reporting of pT, pN, and (when applicable) pM categories is based on information available to the pathologist at the time the report is issued. As per the AJCC (Chapter 1, 8th Ed.) it is the managing physician's responsibility to establish  the final pathologic stage based upon all pertinent information, including but potentially not limited to this pathology report.    pT Category:    pT4a    pN Category:    pN0   Comment(s):    Block: F13   PREVIOUS RADIATION THERAPY: No  PAST MEDICAL HISTORY:  has a past medical history of Ankle fracture, left, Arthritis, Ascending aortic aneurysm (HCC) (01/21/2019), Blind right eye, Cocaine abuse (HCC) (11/17/2018), Coronary artery calcification seen on CAT scan (01/21/2019), Discomfort in chest, Essential hypertension (11/17/2018), Fracture of lateral malleolus of left ankle (12/02/2013), Keloid (11/16/2018), Penetrating atherosclerotic ulcer of aorta (HCC)  (11/16/2018), and Smoking (11/17/2018).    PAST SURGICAL HISTORY: Past Surgical History:  Procedure Laterality Date   BIOPSY  07/03/2023   Procedure: BIOPSY;  Surgeon: Shellia Cleverly, DO;  Location: [facility];  Service: Gastroenterology;;   CAROTID-SUBCLAVIAN BYPASS GRAFT Left 11/26/2018   Procedure: LEFT CAROTID TO SUBCLAVIAN ARTERY TRANSPOSITION;  Surgeon: Nada Libman, MD;  Location: [facility] OR;  Service: Vascular;  Laterality: Left;   ESOPHAGOGASTRODUODENOSCOPY (EGD) WITH PROPOFOL N/A 07/03/2023   Procedure: ESOPHAGOGASTRODUODENOSCOPY (EGD) WITH PROPOFOL;  Surgeon: Shellia Cleverly, DO;  Location: [facility];  Service: Gastroenterology;  Laterality: N/A;   KENALOG INJECTION Right 04/25/2020   Procedure: kenalog injection to right shoulder keloid;  Surgeon: Peggye Form, DO;  Location: [facility];  Service: Plastics;  Laterality: Right;   LESION REMOVAL Left 04/25/2020   Procedure: Excision of keloid/changing skin lesion of scalp;  Surgeon: Peggye Form, DO;  Location: [facility];  Service: Plastics;  Laterality: Left;  1 hour total, please   ORIF ANKLE FRACTURE Left 12/02/2013   Procedure: OPEN REDUCTION INTERNAL FIXATION (ORIF) LEFT ANKLE FRACTURE;  Surgeon: Kathryne Hitch, MD;  Location: [facility];  Service: Orthopedics;  Laterality: Left;   right eye surgery   1977   THORACIC AORTIC ENDOVASCULAR STENT GRAFT N/A 11/26/2018   Procedure: THORACIC AORTIC ENDOVASCULAR STENT using a GORE TAG CONFORMABLE THORACIC GRAFT;  Surgeon: Nada Libman, MD;  Location: [facility];  Service: Vascular;  Laterality: N/A;   TRACHEOSTOMY TUBE PLACEMENT N/A 06/21/2023   Procedure: TRACHEOSTOMY;  Surgeon: Scarlette Ar, MD;  Location: [facility];  Service: ENT;  Laterality: N/A;   ULTRASOUND GUIDANCE FOR VASCULAR ACCESS Bilateral 11/26/2018   Procedure: Ultrasound Guidance For Vascular Access;  Surgeon: Nada Libman, MD;  Location: [facility] OR;  Service: Vascular;   Laterality: Bilateral;    FAMILY HISTORY: family history is not on file.  SOCIAL HISTORY:  reports that he has quit smoking. His smoking use included cigarettes. He has a 5 pack-year smoking history. He has been exposed to tobacco smoke. He has never used smokeless tobacco. He reports that he does not currently use alcohol. He reports that he does not currently use drugs after having used the following drugs: Cocaine and Marijuana.  ALLERGIES: Lisinopril  MEDICATIONS:  Current Outpatient Medications  Medication Sig Dispense Refill   amLODipine (NORVASC) 10 MG tablet Take 10 mg by mouth daily.     carvedilol (COREG) 3.125 MG tablet Take 3.125 mg by mouth 2 (two) times daily with a meal.     sucralfate (CARAFATE) 1 GM/10ML suspension Place 1 g into feeding tube 4 (four) times daily.     celecoxib (CELEBREX) 200 MG capsule Take 200 mg by mouth 2 (two) times daily.     famotidine (PEPCID) 40 MG/5ML suspension Take 40 mg by mouth daily.  Nutritional Supplements (FEEDING SUPPLEMENT, OSMOLITE 1.5 CAL,) LIQD Place 1,000 mLs into feeding tube continuous.     oxyCODONE (ROXICODONE) 5 MG/5ML solution Place 5 mg into feeding tube every 4 (four) hours as needed for moderate pain or severe pain.     pantoprazole (PROTONIX) 40 MG injection Inject 40 mg into the vein every 12 (twelve) hours.     Protein (FEEDING SUPPLEMENT, PROSOURCE TF20,) liquid Place 60 mLs into feeding tube daily.     scopolamine (TRANSDERM-SCOP) 1 MG/3DAYS Place 1 patch (1.5 mg total) onto the skin every 3 (three) days.     No current facility-administered medications for this encounter.    REVIEW OF SYSTEMS:  Notable for that above.   PHYSICAL EXAM:  height is 6\' 4"  (1.93 m) and weight is 158 lb 6 oz (71.8 kg). His temporal temperature is 96.9 F (36.1 C) (abnormal). His blood pressure is 150/102 (abnormal) and his pulse is 57 (abnormal). His respiration is 18 and oxygen saturation is 100%.   General: Alert and oriented, in no  acute distress HEENT: Head is normocephalic. Extraocular movements are intact. Oropharynx is notable for edentulous.  No lesions in upper throat.. Neck: Neck is notable for healing from recent surgery including neck dissection and laryngectomy.  Trach tube is not in place at this time.  There is crusted blood around his stoma. Heart: Regular in rate and rhythm with no murmurs, rubs, or gallops. Chest: Clear to auscultation bilaterally, with no rhonchi, wheezes, or rales. Abdomen: Soft, nontender, nondistended, with no rigidity or guarding.  PEG tube is intact with old dressings Extremities: No cyanosis or edema. Lymphatics: see Neck Exam Skin: No concerning lesions. Musculoskeletal: symmetric strength and muscle tone throughout.  Uses a walker to ambulate. Neurologic: Cranial nerves II through XII are grossly intact. No obvious focalities. Speech is fluent. Coordination is intact. Psychiatric: Judgment and insight are intact. Affect is appropriate.  ECOG = 2  0 - Asymptomatic (Fully active, able to carry on all predisease activities without restriction)  1 - Symptomatic but completely ambulatory (Restricted in physically strenuous activity but ambulatory and able to carry out work of a light or sedentary nature. For example, light housework, office work)  2 - Symptomatic, <50% in bed during the day (Ambulatory and capable of all self care but unable to carry out any work activities. Up and about more than 50% of waking hours)  3 - Symptomatic, >50% in bed, but not bedbound (Capable of only limited self-care, confined to bed or chair 50% or more of waking hours)  4 - Bedbound (Completely disabled. Cannot carry on any self-care. Totally confined to bed or chair)  5 - Death   Santiago Glad MM, Creech RH, Tormey DC, et al. [phone]). "Toxicity and response criteria of the [facility]". Am. Evlyn Clines. Oncol. 5 (6): 649-55   LABORATORY DATA:  Lab Results  Component Value Date    WBC 5.4 07/06/2023   HGB 7.4 (L) 07/06/2023   HCT 22.6 (L) 07/06/2023   MCV 97.8 07/06/2023   PLT 290 07/06/2023   CMP     Component Value Date/Time   NA 134 (L) 07/06/2023 0355   NA 142 03/20/2020 1427   K 3.8 07/06/2023 0355   CL 101 07/06/2023 0355   CO2 27 07/06/2023 0355   GLUCOSE 132 (H) 07/06/2023 0355   BUN 12 07/06/2023 0355   BUN 18 03/20/2020 1427   CREATININE 0.73 07/06/2023 0355   CALCIUM 8.0 (L) 07/06/2023 0355  PROT 6.1 (L) 06/23/2023 0509   PROT 6.7 03/20/2020 1427   ALBUMIN 2.9 (L) 06/23/2023 0509   ALBUMIN 4.3 03/20/2020 1427   AST 20 06/23/2023 0509   ALT 26 06/23/2023 0509   ALKPHOS 71 06/23/2023 0509   BILITOT 1.0 06/23/2023 0509   BILITOT 0.5 03/20/2020 1427   GFRNONAA >60 07/06/2023 0355   GFRAA >60 04/25/2020 1010      Lab Results  Component Value Date   TSH 0.244 (L) 05/04/2023     RADIOGRAPHY: As above, I reviewed his preoperative imaging    IMPRESSION/PLAN:  This is a delightful patient with locally advanced glottic cancer, squamous cell head and neck cancer. I recommend 6 weeks of adjuvant radiotherapy for this patient.  We discussed the potential risks, benefits, and side effects of radiotherapy. We talked in detail about acute and late effects. We discussed that some of the most bothersome acute effects may be mucositis, dysgeusia, salivary changes, skin irritation, hair loss, dehydration, weight loss and fatigue. We talked about late effects which include but are not necessarily limited to dysphagia, hypothyroidism, nerve injury, vascular injury, spinal cord injury, xerostomia, trismus, neck edema, dental issues, non-healing wound, and potentially fatal injury to any of the tissues in the head and neck region. No guarantees of treatment were given. A consent form was signed and placed in the patient's medical record. The patient is enthusiastic about proceeding with treatment. I look forward to participating in the patient's care.     Simulation (treatment planning) will take place in the near future.  We also discussed that the treatment of head and neck cancer is a multidisciplinary process to maximize treatment outcomes and quality of life. For this reason the following referrals have been or will be made:   Medical oncology to discuss chemotherapy  (he understands that chemotherapy will not necessarily be recommended but he looks forward to getting the opinion of Dr.Pasam today)   Nutritionist for nutrition support during and after treatment.   Speech language pathology for swallowing and/or speech therapy.   Social work for social support.    Physical therapy due to risk of lymphedema in neck, postoperative rehabilitation, and deconditioning.   Baseline labs including TSH.  Nursing will clean his PEG site and give him fresh dressings today.  The patient was advised to keep using his trach tube as instructed by otolaryngology.  On date of service, in total, I spent 60 minutes on this encounter. Patient was seen in person.  __________________________________________   Lonie Peak, MD  Direct Dial: [phone]  Fax: [phone] [email or web address]   This document serves as a record of services personally performed by Lonie Peak.  It was created on her behalf by Neena Rhymes, a trained medical scribe. The creation of this record is based on the scribe's personal observations and the provider's statements to them. This document has been checked and approved by the attending provider.

## 2023-09-04 ENCOUNTER — Inpatient Hospital Stay (HOSPITAL_BASED_OUTPATIENT_CLINIC_OR_DEPARTMENT_OTHER): Payer: Medicare HMO | Admitting: Oncology

## 2023-09-04 ENCOUNTER — Ambulatory Visit
Admission: RE | Admit: 2023-09-04 | Discharge: 2023-09-04 | Disposition: A | Discharge status: Home / Self Care | Payer: Medicare HMO | Source: Ambulatory Visit | Attending: Radiation Oncology

## 2023-09-04 ENCOUNTER — Encounter: Payer: Self-pay | Admitting: Oncology

## 2023-09-04 ENCOUNTER — Ambulatory Visit
Admission: RE | Admit: 2023-09-04 | Discharge: 2023-09-04 | Disposition: A | Discharge status: Home / Self Care | Payer: Medicare HMO | Source: Ambulatory Visit | Attending: Radiation Oncology | Admitting: Radiation Oncology

## 2023-09-04 ENCOUNTER — Inpatient Hospital Stay: Payer: Medicare HMO | Attending: Oncology

## 2023-09-04 ENCOUNTER — Other Ambulatory Visit: Payer: Self-pay

## 2023-09-04 ENCOUNTER — Encounter: Payer: Self-pay | Admitting: Radiation Oncology

## 2023-09-04 VITALS — BP 157/82 | HR 65 | Temp 98.1°F | Resp 17 | Ht 76.0 in | Wt 159.3 lb

## 2023-09-04 VITALS — BP 150/102 | HR 57 | Temp 96.9°F | Resp 18 | Ht 76.0 in | Wt 158.4 lb

## 2023-09-04 DIAGNOSIS — H5461 Unqualified visual loss, right eye, normal vision left eye: Secondary | ICD-10-CM | POA: Insufficient documentation

## 2023-09-04 DIAGNOSIS — I2510 Atherosclerotic heart disease of native coronary artery without angina pectoris: Secondary | ICD-10-CM | POA: Diagnosis not present

## 2023-09-04 DIAGNOSIS — I10 Essential (primary) hypertension: Secondary | ICD-10-CM | POA: Insufficient documentation

## 2023-09-04 DIAGNOSIS — Z79899 Other long term (current) drug therapy: Secondary | ICD-10-CM | POA: Diagnosis not present

## 2023-09-04 DIAGNOSIS — Z87891 Personal history of nicotine dependence: Secondary | ICD-10-CM

## 2023-09-04 DIAGNOSIS — R0602 Shortness of breath: Secondary | ICD-10-CM | POA: Insufficient documentation

## 2023-09-04 DIAGNOSIS — R490 Dysphonia: Secondary | ICD-10-CM | POA: Diagnosis not present

## 2023-09-04 DIAGNOSIS — C329 Malignant neoplasm of larynx, unspecified: Secondary | ICD-10-CM

## 2023-09-04 DIAGNOSIS — F1410 Cocaine abuse, uncomplicated: Secondary | ICD-10-CM | POA: Insufficient documentation

## 2023-09-04 DIAGNOSIS — Z791 Long term (current) use of non-steroidal anti-inflammatories (NSAID): Secondary | ICD-10-CM | POA: Diagnosis not present

## 2023-09-04 DIAGNOSIS — D649 Anemia, unspecified: Secondary | ICD-10-CM

## 2023-09-04 DIAGNOSIS — R1310 Dysphagia, unspecified: Secondary | ICD-10-CM | POA: Diagnosis not present

## 2023-09-04 DIAGNOSIS — Z930 Tracheostomy status: Secondary | ICD-10-CM | POA: Insufficient documentation

## 2023-09-04 DIAGNOSIS — C320 Malignant neoplasm of glottis: Secondary | ICD-10-CM | POA: Insufficient documentation

## 2023-09-04 DIAGNOSIS — Z430 Encounter for attention to tracheostomy: Secondary | ICD-10-CM

## 2023-09-04 LAB — CBC WITH DIFFERENTIAL/PLATELET
Abs Immature Granulocytes: 0.02 10*3/uL (ref 0.00–0.07)
Basophils Absolute: 0.1 10*3/uL (ref 0.0–0.1)
Basophils Relative: 1 %
Eosinophils Absolute: 0.3 10*3/uL (ref 0.0–0.5)
Eosinophils Relative: 5 %
HCT: 36.4 % — ABNORMAL LOW (ref 39.0–52.0)
Hemoglobin: 11.6 g/dL — ABNORMAL LOW (ref 13.0–17.0)
Immature Granulocytes: 0 %
Lymphocytes Relative: 17 %
Lymphs Abs: 1.2 10*3/uL (ref 0.7–4.0)
MCH: 30.3 pg (ref 26.0–34.0)
MCHC: 31.9 g/dL (ref 30.0–36.0)
MCV: 95 fL (ref 80.0–100.0)
Monocytes Absolute: 0.5 10*3/uL (ref 0.1–1.0)
Monocytes Relative: 8 %
Neutro Abs: 4.9 10*3/uL (ref 1.7–7.7)
Neutrophils Relative %: 69 %
Platelets: 392 10*3/uL (ref 150–400)
RBC: 3.83 MIL/uL — ABNORMAL LOW (ref 4.22–5.81)
RDW: 17 % — ABNORMAL HIGH (ref 11.5–15.5)
WBC: 7 10*3/uL (ref 4.0–10.5)
nRBC: 0 % (ref 0.0–0.2)

## 2023-09-04 LAB — COMPREHENSIVE METABOLIC PANEL
ALT: 11 U/L (ref 0–44)
AST: 13 U/L — ABNORMAL LOW (ref 15–41)
Albumin: 4.2 g/dL (ref 3.5–5.0)
Alkaline Phosphatase: 94 U/L (ref 38–126)
Anion gap: 7 (ref 5–15)
BUN: 14 mg/dL (ref 8–23)
CO2: 29 mmol/L (ref 22–32)
Calcium: 9.4 mg/dL (ref 8.9–10.3)
Chloride: 105 mmol/L (ref 98–111)
Creatinine, Ser: 0.82 mg/dL (ref 0.61–1.24)
GFR, Estimated: >60 mL/min (ref >60)
Glucose, Bld: 77 mg/dL (ref 70–99)
Potassium: 3.8 mmol/L (ref 3.5–5.1)
Sodium: 141 mmol/L (ref 135–145)
Total Bilirubin: 0.4 mg/dL (ref <1.2)
Total Protein: 7.3 g/dL (ref 6.5–8.1)

## 2023-09-04 LAB — IRON AND IRON BINDING CAPACITY (CC-WL,HP ONLY)
Iron: 55 ug/dL (ref 45–182)
Saturation Ratios: 17 % — ABNORMAL LOW (ref 17.9–39.5)
TIBC: 318 ug/dL (ref 250–450)
UIBC: 263 ug/dL (ref 117–376)

## 2023-09-04 LAB — FERRITIN: Ferritin: 58 ng/mL (ref 24–336)

## 2023-09-04 LAB — VITAMIN B12: Vitamin B-12: 374 pg/mL (ref 180–914)

## 2023-09-04 LAB — TSH: TSH: 0.687 u[IU]/mL (ref 0.350–4.500)

## 2023-09-04 LAB — FOLATE: Folate: 12.5 ng/mL (ref >5.9)

## 2023-09-04 NOTE — Progress Notes (Signed)
Oncology Nurse Navigator Documentation   Met with patient during initial consult with Garrett Fleming. He was accompanied by his common law wife Garrett Fleming.  I introduced myself as his/their Navigator, explained my role as a member of the Care Team. Provided New Patient resource guide binder: Contact information for physicians, this navigator, other members of the Care Team Advance Directive information; provided [facility] AD booklet at their request,  Fall Prevention Patient Safety Plan Financial Assistance Information sheet Symptom Management Clinic information [facility] campus map with highlight of [facility] SLP Information sheet Head and Neck cancer basics Nutrition information Patient and family support information including Spiritual care/Chaplain information, Peer mentor program, health and wellness classes, and the survivorship program Community resources  Provided and discussed educational handouts for PEG and PAC. He has a PEG in place from [facility]. I cleaned up the area due to tape residue with adhesive remover. I cleaned around the PEG which was clean without any signs of infection present. I placed a split gauze and provided them with mesh wrap to hold tube in place and dressings to use until [name] call his [facility] company to deliver more supplies.  Assisted with post-consult appt scheduling.  They verbalized understanding of information provided. He will see Dr. Arlana Pouch at 1:00 today for medical oncology evaluation.  I encouraged them to call with questions/concerns moving forward.  Hedda Slade, RN, BSN, OCN Head & Neck Oncology Nurse Navigator [facility] [place] [phone]

## 2023-09-04 NOTE — Assessment & Plan Note (Addendum)
-   Please review HPI above for additional details and timeline of events.    - He is status post total laryngectomy, bilateral neck dissection, left pectoralis major musculocutaneous flap and split thickness skin graft from left thigh on 07/09/2023.   Pathology from the procedure showed: tumor size of 3.9 cm; invasive squamous cell carcinoma invading into the thyroid cartilage; negative for LVI or PNI; focal thyroid tissue negative for neoplasm; all margins negative for carcinoma; nodal status of 49/49 dissected cervical lymph nodes negative for carcinoma.  pT4,pN0,cM0  - No issues with swallowing or breathing. Negative margins and lymph nodes on pathology.  - Discussed staging, prognosis, plan of care, treatment options.  Reviewed NCCN guidelines.  No high risk features noted on his pathology.  Discussed options of radiation treatments alone or radiation combination with chemotherapy in the adjuvant setting.  Given lack of high risk features, radiation alone was recommended for him. We have discussed about role of cisplatin being a radiosensitizer in the treatment of head and neck cancer. We have discussed about mechanism of action of cisplatin, adverse effects of cisplatin including but not limited to fatigue, nausea, vomiting, increased risk of infections, mucositis, ototoxicity, nephrotoxicity, peripheral neuropathy.  Patient understands that some of the side effects can be permanent and even potentially fatal.    -Plan for radiation therapy alone to minimize risk of recurrence, given the absence of high-risk features on pathology.  Patient and his fiance are agreeable with this approach.
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
## 2023-09-04 NOTE — Assessment & Plan Note (Signed)
Chronic, fluctuating anemia.  Hemoglobin is actually better at 11.6 today.  MCV 95.  Iron studies showed no evidence of iron deficiency.  B12 and folate are also normal.  Likely anemia of chronic disease.  No additional intervention needed at current values.

## 2023-09-04 NOTE — Progress Notes (Signed)
Head and Neck Cancer Location of Tumor / Histology:  Squamous cell carcinoma of larynx    Patient presented with symptoms of: per 05/08/23 op-note: "Patient states that the mass came up last Friday and got bigger to the point he was having trouble breathing   CT Neck w/ Contrast 07/07/2023 --Impression: Interval progression of locally invasive transglottic malignancy consistent with biopsy-proven squamous cell carcinoma as detailed above.  No visualized threshold enlarged adenopathy, however PET/CT may be beneficial to exclude underlying nodal metastasis.  Changes of interval tracheostomy, without evidence of acute complication.   Biopsies revealed:  07/09/2023 A. RIGHT NECK CONTENTS, LEVELS 2A, 3, AND 4, DISSECTION:              Sixteen (16) lymph nodes are negative for carcinoma (0/16).              Some lymph nodes with reactive lymphoid hyperplasia.  B. RIGHT NECK CONTENTS, LEVEL 2B, DISSECTION:              Four (4) lymph nodes are negative for carcinoma (0/4).  C. LEFT INFERIOR STERNOCLEIDOMASTOID, EXCISION: Skeletal muscle and fibroadipose tissue with patchy inflammation, fat necrosis, granulation tissue, and multinucleated giant cell reaction.  No malignancy identified.  D. LEFT NECK CONTENTS, LEVELS 2A, 3, AND 4, DISSECTION:              Twenty-six (26) lymph nodes are negative for carcinoma (0/26).              Some lymph nodes with reactive lymphoid hyperplasia.  E. PARATRACHEAL LYMPH NODES, EXCISION:              Three (3) lymph nodes are negative for carcinoma (0/3).               Parathyroid tissue, focal. F. LARYNX, LARYNGECTOMY:              Invasive squamous cell carcinoma, moderately differentiated.  Tumor is 3.9 cm in greatest dimension with transglottic extension.  Tumor invades into the thyroid cartilage.                           No lymphatic or perineural invasion identified.                            Focal thyroid tissue negative for neoplasm.                             Surgical margins are negative for carcinoma.                            Pathologic stage (AJCC 8th Edition): pT4a, pN0                            See CAP synoptic.  Nutrition Status Yes No Comments  Weight changes? []  [x]  Wt Readings from Last 3 Encounters:  09/04/23 158 lb 6 oz (71.8 kg)  07/05/23 130 lb 11.7 oz (59.3 kg)  05/04/23 169 lb (76.7 kg)    Swallowing concerns? []  [x]  Denies (though his partner admits that he's not supposed to take food/drink by mouth)  PEG? [x]  []     Referrals Yes No Comments  Social Work? [x]  []    Dentistry? [x]  []  Extractions done 07/09/23 by Dr. Arne Cleveland  [place]...Dr. Eduard Clos   Swallowing therapy? [x]  []    Nutrition? [x]  []    Med/Onc? [x]  []  Dr. Archie Patten Pasam   Safety Issues Yes No Comments  Prior radiation? []  [x]    Pacemaker/ICD? []  [x]    Possible current pregnancy? []  [x]  N/A  Is the patient on methotrexate? []  [x]     Tobacco/Marijuana/Snuff/ETOH use: Patient is a former smoker. Denies any current alcohol or recreational drug use.  Past/Anticipated interventions by otolaryngology, if any:  Esophagram 08/13/2023  --CONCLUSION:  No leak identified.  Stasis of contrast at the neopharynx, with incomplete distensibility, possibly due to postoperative edema.   07/09/2023 --Dr. Corey Skains Direct laryngoscopy with operating telescope. Bilateral neck dissection levels 2, 3 and 4 Total laryngectomy, including resection of overlying skin  Left pectoralis major musculocutaneous flap for pharyngeal closure. Split thickness skin graft from left thigh, for resurfacing of anterior neck skin, measuring 3 x 4 inches   05/08/2023 --Dr. Corey Skains 1. Direct laryngoscopy with operative telescope 2. Intubation by surgeon 3. Biopsy of laryngeal mass   Past/Anticipated interventions by medical oncology, if any:  Consult with Dr. Arlana Pouch later today   Current Complaints / other details:  Patient has been out of his BP  medications for several weeks

## 2023-09-04 NOTE — Assessment & Plan Note (Signed)
Daily blood clots noted, possibly related to strong cough. -Adjust tracheostomy care as needed to manage bleeding. -Follow up with ENT for tracheostomy management.

## 2023-09-07 ENCOUNTER — Telehealth: Payer: Self-pay

## 2023-09-07 NOTE — Telephone Encounter (Signed)
[facility] Clinical Social Work  Clinical Social Work was referred by Statistician for assessment of psychosocial needs.  Clinical Social Worker attempted to contact patient's spouse to offer support and assess for needs.   No answer, left vm with direct contact information.    Marguerita Merles, LCSW  Clinical Social Worker [facility]

## 2023-09-08 ENCOUNTER — Telehealth: Payer: Self-pay

## 2023-09-08 NOTE — Telephone Encounter (Signed)
[facility] Clinical Social Work  Clinical Social Work was referred by nurse for assessment of psychosocial needs.  Clinical Social Worker attempted to contact patient's wife to offer support and assess for needs.  CSW was unable to reach patient's wife at either numbers, left vm x2. CSW notified referral source. Referral will be closed, can be resent if needed.   Marguerita Merles, LCSW  Clinical Social Worker [facility]

## 2023-09-08 NOTE — Progress Notes (Signed)
Oncology Nurse Navigator Documentation   I left a VM with Garrett Fleming significant other asking for a return call to schedule his radiation planning session. I provided her with the direct contact number to Radiation to get it scheduled. Radiation has also attempted to call him several times over the past few days to get him scheduled. I will keep attempting to call if they do not return my call.  Hedda Slade RN, BSN, OCN Head & Neck Oncology Nurse Navigator [facility] at [facility] Phone # [phone]  Fax # [phone]

## 2023-09-10 NOTE — Progress Notes (Signed)
Oncology Nurse Navigator Documentation   I have left several voicemails on Garrett Fleming's contact numbers yesterday and today requesting a call back to schedule his radiation planning appointment. On the voicemail I provided the direct number to radiation planning and my direct number as well to call for questions. I will continue to try to reach Garrett Fleming and his significant other Garrett Fleming.  Hedda Slade RN, BSN, OCN Head & Neck Oncology Nurse Navigator [facility] at [facility] Phone # [phone]  Fax # [phone]

## 2023-09-21 ENCOUNTER — Inpatient Hospital Stay: Payer: Medicare HMO

## 2023-09-21 ENCOUNTER — Ambulatory Visit
Admission: RE | Admit: 2023-09-21 | Discharge: 2023-09-21 | Disposition: A | Discharge status: Home / Self Care | Payer: Medicare HMO | Source: Ambulatory Visit | Attending: Radiation Oncology

## 2023-09-21 ENCOUNTER — Ambulatory Visit
Admission: RE | Admit: 2023-09-21 | Discharge: 2023-09-21 | Disposition: A | Discharge status: Home / Self Care | Payer: Medicare HMO | Source: Ambulatory Visit | Attending: Radiation Oncology | Admitting: Radiation Oncology

## 2023-09-21 VITALS — BP 171/91 | HR 58 | Temp 97.1°F | Resp 20 | Ht 76.0 in | Wt 166.4 lb

## 2023-09-21 DIAGNOSIS — C320 Malignant neoplasm of glottis: Secondary | ICD-10-CM | POA: Diagnosis not present

## 2023-09-21 DIAGNOSIS — C329 Malignant neoplasm of larynx, unspecified: Secondary | ICD-10-CM

## 2023-09-21 MED ORDER — SODIUM CHLORIDE 0.9% FLUSH
10.0000 mL | INTRAVENOUS | Status: DC | PRN
  Administered 2023-09-21: 10 mL via INTRAVENOUS

## 2023-09-21 NOTE — Progress Notes (Signed)
Oncology Nurse Navigator Documentation   To provide support, encouragement and care continuity, met with Mr. Crispin and his partner during his CT SIM.  He tolerated procedure without difficulty, denied questions/concerns.    I encouraged him to call me prior to 10/05/23 [place].   Hedda Slade RN, BSN, OCN Head & Neck Oncology Nurse Navigator [facility] at [facility] Phone # [phone]  Fax # [phone]

## 2023-09-21 NOTE — Progress Notes (Signed)
Nutrition  Patient did not show up for scheduled nutrition appointment today.  Will send message to scheduling to offer another appointment  RD call phone number listed under contacts with no success.   Melanee Cordial B. Freida Busman, RD, LDN Registered Dietitian [phone]

## 2023-09-21 NOTE — Progress Notes (Signed)
Has armband been applied?  Yes.    Does patient have an allergy to IV contrast dye?: No.   Has patient ever received premedication for IV contrast dye?: No.   Does patient take metformin?: No.  If patient does take metformin when was the last dose: N/A  Date of lab work: 09/04/2023 BUN: 14 CR: 0.82 eGfr: >60  IV site: forearm right, condition patent and no redness  Has IV site been added to flowsheet?  Yes.    This concludes the interaction.  Ruel Favors, LPN

## 2023-09-24 DIAGNOSIS — I10 Essential (primary) hypertension: Secondary | ICD-10-CM | POA: Diagnosis not present

## 2023-09-24 DIAGNOSIS — C329 Malignant neoplasm of larynx, unspecified: Secondary | ICD-10-CM | POA: Diagnosis not present

## 2023-09-24 DIAGNOSIS — J398 Other specified diseases of upper respiratory tract: Secondary | ICD-10-CM | POA: Diagnosis not present

## 2023-09-24 DIAGNOSIS — Z9002 Acquired absence of larynx: Secondary | ICD-10-CM | POA: Diagnosis not present

## 2023-09-24 DIAGNOSIS — J9509 Other tracheostomy complication: Secondary | ICD-10-CM | POA: Diagnosis not present

## 2023-09-28 ENCOUNTER — Other Ambulatory Visit: Payer: Self-pay

## 2023-09-28 DIAGNOSIS — C329 Malignant neoplasm of larynx, unspecified: Secondary | ICD-10-CM

## 2023-10-05 ENCOUNTER — Ambulatory Visit: Payer: Medicare HMO

## 2023-10-05 ENCOUNTER — Ambulatory Visit
Admission: RE | Admit: 2023-10-05 | Discharge: 2023-10-05 | Disposition: A | Discharge status: Home / Self Care | Payer: Medicare HMO | Source: Ambulatory Visit | Attending: Radiation Oncology | Admitting: Radiation Oncology

## 2023-10-05 ENCOUNTER — Other Ambulatory Visit: Payer: Self-pay

## 2023-10-05 ENCOUNTER — Other Ambulatory Visit: Payer: Self-pay | Admitting: Radiation Oncology

## 2023-10-05 DIAGNOSIS — Z430 Encounter for attention to tracheostomy: Secondary | ICD-10-CM | POA: Insufficient documentation

## 2023-10-05 DIAGNOSIS — C320 Malignant neoplasm of glottis: Secondary | ICD-10-CM | POA: Diagnosis not present

## 2023-10-05 DIAGNOSIS — Z510 Encounter for antineoplastic radiation therapy: Secondary | ICD-10-CM | POA: Diagnosis not present

## 2023-10-05 DIAGNOSIS — C329 Malignant neoplasm of larynx, unspecified: Secondary | ICD-10-CM

## 2023-10-05 LAB — RAD ONC ARIA SESSION SUMMARY
Course Elapsed Days: 0
Plan Fractions Treated to Date: 1
Plan Prescribed Dose Per Fraction: 2 Gy
Plan Total Fractions Prescribed: 30
Plan Total Prescribed Dose: 60 Gy
Reference Point Dosage Given to Date: 2 Gy
Reference Point Session Dosage Given: 2 Gy
Session Number: 1

## 2023-10-05 MED ORDER — SONAFINE EX EMUL
1.0000 | Freq: Two times a day (BID) | CUTANEOUS | Status: DC
  Administered 2023-10-05: 1 via TOPICAL

## 2023-10-05 MED ORDER — LIDOCAINE VISCOUS HCL 2 % MT SOLN: OROMUCOSAL | 3 refills | Status: DC

## 2023-10-05 NOTE — Progress Notes (Signed)
 Oncology Nurse Navigator Documentation   To provide support, encouragement and care continuity, met with Garrett Fleming after his initial RT.  He was accompanied by his significant other, Garrett Fleming.  I reviewed the 2-step treatment process, answered questions.  Garrett Fleming completed treatment without difficulty, denied questions/concerns. I reviewed the registration/arrival procedure for subsequent treatments. I have referred him to our transportation coordinator to help set up transportation as able.  I encouraged them to call me with questions/concerns as treatments proceed.   Garrett Jefferson RN, BSN, OCN Head & Neck Oncology Nurse Navigator [facility] at [facility] Phone # [phone]  Fax # [phone]

## 2023-10-05 NOTE — Progress Notes (Signed)
 Pt here for patient teaching. Pt given Radiation and You booklet, Managing Acute Radiation Side Effects for Head and Neck Cancer handout, skin care instructions, and Sonafine. Reviewed areas of pertinence such as fatigue, hair loss, mouth changes, skin changes, throat changes, and taste changes . Pt able to give teach back. Given instructions to pat skin, use unscented/gentle soap, and drink plenty of water,apply Sonafine bid, avoid applying anything to skin within 4 hours of treatment, and to use an electric razor if they must shave. Pt verbalizes understanding of information given and will contact nursing with any questions or concerns.

## 2023-10-06 ENCOUNTER — Other Ambulatory Visit: Payer: Self-pay

## 2023-10-06 ENCOUNTER — Ambulatory Visit
Admission: RE | Admit: 2023-10-06 | Discharge: 2023-10-06 | Disposition: A | Discharge status: Home / Self Care | Payer: Medicare HMO | Source: Ambulatory Visit | Attending: Radiation Oncology

## 2023-10-06 ENCOUNTER — Inpatient Hospital Stay: Payer: Medicare HMO | Admitting: Nutrition

## 2023-10-06 DIAGNOSIS — Z510 Encounter for antineoplastic radiation therapy: Secondary | ICD-10-CM | POA: Diagnosis not present

## 2023-10-06 DIAGNOSIS — C320 Malignant neoplasm of glottis: Secondary | ICD-10-CM | POA: Diagnosis not present

## 2023-10-06 LAB — RAD ONC ARIA SESSION SUMMARY
Course Elapsed Days: 1
Plan Fractions Treated to Date: 2
Plan Prescribed Dose Per Fraction: 2 Gy
Plan Total Fractions Prescribed: 30
Plan Total Prescribed Dose: 60 Gy
Reference Point Dosage Given to Date: 4 Gy
Reference Point Session Dosage Given: 2 Gy
Session Number: 2

## 2023-10-07 ENCOUNTER — Other Ambulatory Visit: Payer: Self-pay

## 2023-10-07 ENCOUNTER — Ambulatory Visit
Admission: RE | Admit: 2023-10-07 | Discharge: 2023-10-07 | Disposition: A | Discharge status: Home / Self Care | Payer: Medicare HMO | Source: Ambulatory Visit | Attending: Radiation Oncology | Admitting: Radiation Oncology

## 2023-10-07 DIAGNOSIS — C320 Malignant neoplasm of glottis: Secondary | ICD-10-CM | POA: Diagnosis not present

## 2023-10-07 DIAGNOSIS — Z510 Encounter for antineoplastic radiation therapy: Secondary | ICD-10-CM | POA: Diagnosis not present

## 2023-10-07 LAB — RAD ONC ARIA SESSION SUMMARY
Course Elapsed Days: 2
Plan Fractions Treated to Date: 3
Plan Prescribed Dose Per Fraction: 2 Gy
Plan Total Fractions Prescribed: 30
Plan Total Prescribed Dose: 60 Gy
Reference Point Dosage Given to Date: 6 Gy
Reference Point Session Dosage Given: 2 Gy
Session Number: 3

## 2023-10-08 ENCOUNTER — Ambulatory Visit: Payer: Medicare HMO

## 2023-10-08 ENCOUNTER — Inpatient Hospital Stay: Payer: Medicare HMO | Admitting: Nutrition

## 2023-10-09 ENCOUNTER — Ambulatory Visit: Payer: Medicare HMO

## 2023-10-12 ENCOUNTER — Other Ambulatory Visit: Payer: Self-pay

## 2023-10-12 ENCOUNTER — Ambulatory Visit
Admission: RE | Admit: 2023-10-12 | Discharge: 2023-10-12 | Disposition: A | Discharge status: Home / Self Care | Payer: Medicare HMO | Source: Ambulatory Visit | Attending: Radiation Oncology | Admitting: Radiation Oncology

## 2023-10-12 ENCOUNTER — Ambulatory Visit: Payer: Medicare HMO

## 2023-10-12 DIAGNOSIS — Z510 Encounter for antineoplastic radiation therapy: Secondary | ICD-10-CM | POA: Diagnosis not present

## 2023-10-12 DIAGNOSIS — C320 Malignant neoplasm of glottis: Secondary | ICD-10-CM | POA: Diagnosis not present

## 2023-10-12 LAB — RAD ONC ARIA SESSION SUMMARY
Course Elapsed Days: 7
Plan Fractions Treated to Date: 4
Plan Prescribed Dose Per Fraction: 2 Gy
Plan Total Fractions Prescribed: 30
Plan Total Prescribed Dose: 60 Gy
Reference Point Dosage Given to Date: 8 Gy
Reference Point Session Dosage Given: 2 Gy
Session Number: 4

## 2023-10-13 ENCOUNTER — Ambulatory Visit
Admission: RE | Admit: 2023-10-13 | Discharge: 2023-10-13 | Disposition: A | Discharge status: Home / Self Care | Payer: Medicare HMO | Source: Ambulatory Visit | Attending: Radiation Oncology | Admitting: Radiation Oncology

## 2023-10-13 ENCOUNTER — Other Ambulatory Visit: Payer: Self-pay

## 2023-10-13 DIAGNOSIS — C320 Malignant neoplasm of glottis: Secondary | ICD-10-CM | POA: Diagnosis not present

## 2023-10-13 DIAGNOSIS — Z510 Encounter for antineoplastic radiation therapy: Secondary | ICD-10-CM | POA: Diagnosis not present

## 2023-10-13 LAB — RAD ONC ARIA SESSION SUMMARY
Course Elapsed Days: 8
Plan Fractions Treated to Date: 5
Plan Prescribed Dose Per Fraction: 2 Gy
Plan Total Fractions Prescribed: 30
Plan Total Prescribed Dose: 60 Gy
Reference Point Dosage Given to Date: 10 Gy
Reference Point Session Dosage Given: 2 Gy
Session Number: 5

## 2023-10-14 ENCOUNTER — Other Ambulatory Visit: Payer: Self-pay

## 2023-10-14 ENCOUNTER — Ambulatory Visit
Admission: RE | Admit: 2023-10-14 | Discharge: 2023-10-14 | Disposition: A | Discharge status: Home / Self Care | Payer: Medicare HMO | Source: Ambulatory Visit | Attending: Radiation Oncology | Admitting: Radiation Oncology

## 2023-10-14 DIAGNOSIS — Z510 Encounter for antineoplastic radiation therapy: Secondary | ICD-10-CM | POA: Diagnosis not present

## 2023-10-14 DIAGNOSIS — C320 Malignant neoplasm of glottis: Secondary | ICD-10-CM | POA: Diagnosis not present

## 2023-10-14 LAB — RAD ONC ARIA SESSION SUMMARY
Course Elapsed Days: 9
Plan Fractions Treated to Date: 6
Plan Prescribed Dose Per Fraction: 2 Gy
Plan Total Fractions Prescribed: 30
Plan Total Prescribed Dose: 60 Gy
Reference Point Dosage Given to Date: 12 Gy
Reference Point Session Dosage Given: 2 Gy
Session Number: 6

## 2023-10-15 ENCOUNTER — Ambulatory Visit: Payer: Medicare HMO

## 2023-10-15 ENCOUNTER — Ambulatory Visit: Payer: Medicare HMO | Admitting: Physical Therapy

## 2023-10-15 ENCOUNTER — Ambulatory Visit
Admission: RE | Admit: 2023-10-15 | Discharge: 2023-10-15 | Disposition: A | Discharge status: Home / Self Care | Payer: Medicare HMO | Source: Ambulatory Visit | Attending: Radiation Oncology | Admitting: Radiation Oncology

## 2023-10-15 ENCOUNTER — Inpatient Hospital Stay: Payer: Medicare HMO | Attending: Oncology | Admitting: Nutrition

## 2023-10-15 ENCOUNTER — Other Ambulatory Visit: Payer: Self-pay

## 2023-10-15 DIAGNOSIS — Z510 Encounter for antineoplastic radiation therapy: Secondary | ICD-10-CM | POA: Diagnosis not present

## 2023-10-15 DIAGNOSIS — C320 Malignant neoplasm of glottis: Secondary | ICD-10-CM | POA: Diagnosis not present

## 2023-10-15 LAB — RAD ONC ARIA SESSION SUMMARY
Course Elapsed Days: 10
Plan Fractions Treated to Date: 7
Plan Prescribed Dose Per Fraction: 2 Gy
Plan Total Fractions Prescribed: 30
Plan Total Prescribed Dose: 60 Gy
Reference Point Dosage Given to Date: 14 Gy
Reference Point Session Dosage Given: 2 Gy
Session Number: 7
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
## 2023-10-16 ENCOUNTER — Ambulatory Visit
Admission: RE | Admit: 2023-10-16 | Discharge: 2023-10-16 | Disposition: A | Discharge status: Home / Self Care | Payer: Medicare HMO | Source: Ambulatory Visit | Attending: Radiation Oncology

## 2023-10-16 ENCOUNTER — Other Ambulatory Visit: Payer: Self-pay | Admitting: Radiation Oncology

## 2023-10-16 ENCOUNTER — Other Ambulatory Visit: Payer: Self-pay

## 2023-10-16 ENCOUNTER — Inpatient Hospital Stay: Payer: Medicare HMO

## 2023-10-16 DIAGNOSIS — Z510 Encounter for antineoplastic radiation therapy: Secondary | ICD-10-CM | POA: Diagnosis not present

## 2023-10-16 DIAGNOSIS — C320 Malignant neoplasm of glottis: Secondary | ICD-10-CM | POA: Diagnosis not present

## 2023-10-16 DIAGNOSIS — C329 Malignant neoplasm of larynx, unspecified: Secondary | ICD-10-CM

## 2023-10-16 DIAGNOSIS — Z430 Encounter for attention to tracheostomy: Secondary | ICD-10-CM

## 2023-10-16 LAB — RAD ONC ARIA SESSION SUMMARY
Course Elapsed Days: 11
Plan Fractions Treated to Date: 8
Plan Prescribed Dose Per Fraction: 2 Gy
Plan Total Fractions Prescribed: 30
Plan Total Prescribed Dose: 60 Gy
Reference Point Dosage Given to Date: 16 Gy
Reference Point Session Dosage Given: 2 Gy
Session Number: 8

## 2023-10-16 MED ORDER — GUAIFENESIN 100 MG/5ML PO LIQD: 10.0000 mL | Freq: Four times a day (QID) | ORAL | 4 refills | Status: DC | PRN

## 2023-10-19 ENCOUNTER — Ambulatory Visit: Payer: Medicare HMO

## 2023-10-20 ENCOUNTER — Ambulatory Visit: Payer: Medicare HMO

## 2023-10-20 ENCOUNTER — Ambulatory Visit
Admission: RE | Admit: 2023-10-20 | Discharge: 2023-10-20 | Disposition: A | Discharge status: Home / Self Care | Payer: Medicare HMO | Source: Ambulatory Visit | Attending: Radiation Oncology | Admitting: Radiation Oncology

## 2023-10-20 ENCOUNTER — Other Ambulatory Visit: Payer: Self-pay

## 2023-10-20 DIAGNOSIS — Z510 Encounter for antineoplastic radiation therapy: Secondary | ICD-10-CM | POA: Diagnosis not present

## 2023-10-20 DIAGNOSIS — C320 Malignant neoplasm of glottis: Secondary | ICD-10-CM | POA: Diagnosis not present

## 2023-10-20 LAB — RAD ONC ARIA SESSION SUMMARY
Course Elapsed Days: 15
Plan Fractions Treated to Date: 9
Plan Prescribed Dose Per Fraction: 2 Gy
Plan Total Fractions Prescribed: 30
Plan Total Prescribed Dose: 60 Gy
Reference Point Dosage Given to Date: 18 Gy
Reference Point Session Dosage Given: 2 Gy
Session Number: 9

## 2023-10-21 ENCOUNTER — Ambulatory Visit
Admission: RE | Admit: 2023-10-21 | Discharge: 2023-10-21 | Disposition: A | Discharge status: Home / Self Care | Payer: Medicare HMO | Source: Ambulatory Visit | Attending: Radiation Oncology | Admitting: Radiation Oncology

## 2023-10-21 ENCOUNTER — Other Ambulatory Visit: Payer: Self-pay

## 2023-10-21 ENCOUNTER — Ambulatory Visit
Admission: RE | Admit: 2023-10-21 | Discharge: 2023-10-21 | Disposition: A | Discharge status: Home / Self Care | Payer: Medicare HMO | Source: Ambulatory Visit | Attending: Radiation Oncology

## 2023-10-21 ENCOUNTER — Inpatient Hospital Stay: Payer: Medicare HMO

## 2023-10-21 DIAGNOSIS — Z510 Encounter for antineoplastic radiation therapy: Secondary | ICD-10-CM | POA: Diagnosis not present

## 2023-10-21 DIAGNOSIS — C320 Malignant neoplasm of glottis: Secondary | ICD-10-CM | POA: Diagnosis not present

## 2023-10-21 LAB — RAD ONC ARIA SESSION SUMMARY
Course Elapsed Days: 16
Plan Fractions Treated to Date: 10
Plan Prescribed Dose Per Fraction: 2 Gy
Plan Total Fractions Prescribed: 30
Plan Total Prescribed Dose: 60 Gy
Reference Point Dosage Given to Date: 20 Gy
Reference Point Session Dosage Given: 2 Gy
Session Number: 10

## 2023-10-22 ENCOUNTER — Ambulatory Visit
Admission: RE | Admit: 2023-10-22 | Discharge: 2023-10-22 | Disposition: A | Discharge status: Home / Self Care | Payer: Medicare HMO | Source: Ambulatory Visit | Attending: Radiation Oncology | Admitting: Radiation Oncology

## 2023-10-22 ENCOUNTER — Other Ambulatory Visit: Payer: Self-pay

## 2023-10-22 ENCOUNTER — Inpatient Hospital Stay: Payer: Medicare HMO

## 2023-10-22 DIAGNOSIS — C320 Malignant neoplasm of glottis: Secondary | ICD-10-CM | POA: Diagnosis not present

## 2023-10-22 DIAGNOSIS — Z510 Encounter for antineoplastic radiation therapy: Secondary | ICD-10-CM | POA: Diagnosis not present

## 2023-10-22 LAB — RAD ONC ARIA SESSION SUMMARY
Course Elapsed Days: 17
Plan Fractions Treated to Date: 11
Plan Prescribed Dose Per Fraction: 2 Gy
Plan Total Fractions Prescribed: 30
Plan Total Prescribed Dose: 60 Gy
Reference Point Dosage Given to Date: 22 Gy
Reference Point Session Dosage Given: 2 Gy
Session Number: 11

## 2023-10-22 NOTE — Progress Notes (Addendum)
Patient tolerated trach suctioning well. Denies any issues.

## 2023-10-23 ENCOUNTER — Ambulatory Visit: Payer: Self-pay

## 2023-10-23 ENCOUNTER — Ambulatory Visit: Payer: Medicare HMO

## 2023-10-23 ENCOUNTER — Inpatient Hospital Stay: Payer: Medicare HMO | Admitting: Dietician

## 2023-10-26 ENCOUNTER — Ambulatory Visit: Payer: Medicare HMO

## 2023-10-26 ENCOUNTER — Ambulatory Visit: Payer: Self-pay

## 2023-10-27 ENCOUNTER — Other Ambulatory Visit: Payer: Self-pay

## 2023-10-27 ENCOUNTER — Ambulatory Visit
Admission: RE | Admit: 2023-10-27 | Discharge: 2023-10-27 | Disposition: A | Discharge status: Home / Self Care | Payer: Medicare HMO | Source: Ambulatory Visit | Attending: Radiation Oncology | Admitting: Radiation Oncology

## 2023-10-27 DIAGNOSIS — Z510 Encounter for antineoplastic radiation therapy: Secondary | ICD-10-CM | POA: Diagnosis not present

## 2023-10-27 DIAGNOSIS — C329 Malignant neoplasm of larynx, unspecified: Secondary | ICD-10-CM

## 2023-10-27 DIAGNOSIS — C320 Malignant neoplasm of glottis: Secondary | ICD-10-CM | POA: Diagnosis not present

## 2023-10-27 LAB — RAD ONC ARIA SESSION SUMMARY
Course Elapsed Days: 22
Plan Fractions Treated to Date: 12
Plan Prescribed Dose Per Fraction: 2 Gy
Plan Total Fractions Prescribed: 30
Plan Total Prescribed Dose: 60 Gy
Reference Point Dosage Given to Date: 24 Gy
Reference Point Session Dosage Given: 2 Gy
Session Number: 12

## 2023-10-27 MED ORDER — SONAFINE EX EMUL
1.0000 | Freq: Two times a day (BID) | CUTANEOUS | Status: DC
  Administered 2023-10-27: 1 via TOPICAL

## 2023-10-28 ENCOUNTER — Ambulatory Visit: Payer: Medicare HMO

## 2023-10-28 ENCOUNTER — Ambulatory Visit
Admission: RE | Admit: 2023-10-28 | Discharge: 2023-10-28 | Disposition: A | Discharge status: Home / Self Care | Payer: Medicare HMO | Source: Ambulatory Visit | Attending: Radiation Oncology

## 2023-10-28 ENCOUNTER — Ambulatory Visit (HOSPITAL_COMMUNITY)
Admission: RE | Admit: 2023-10-28 | Discharge: 2023-10-28 | Disposition: A | Discharge status: Home / Self Care | Payer: Medicare HMO | Source: Ambulatory Visit | Attending: Cardiology | Admitting: Cardiology

## 2023-10-28 ENCOUNTER — Other Ambulatory Visit: Payer: Self-pay

## 2023-10-28 ENCOUNTER — Telehealth: Payer: Self-pay | Admitting: Acute Care

## 2023-10-28 DIAGNOSIS — Z923 Personal history of irradiation: Secondary | ICD-10-CM | POA: Diagnosis not present

## 2023-10-28 DIAGNOSIS — Z9002 Acquired absence of larynx: Secondary | ICD-10-CM | POA: Diagnosis not present

## 2023-10-28 DIAGNOSIS — Z9889 Other specified postprocedural states: Secondary | ICD-10-CM

## 2023-10-28 DIAGNOSIS — C329 Malignant neoplasm of larynx, unspecified: Secondary | ICD-10-CM | POA: Diagnosis not present

## 2023-10-28 DIAGNOSIS — Z510 Encounter for antineoplastic radiation therapy: Secondary | ICD-10-CM | POA: Diagnosis not present

## 2023-10-28 DIAGNOSIS — Z87891 Personal history of nicotine dependence: Secondary | ICD-10-CM | POA: Insufficient documentation

## 2023-10-28 DIAGNOSIS — C320 Malignant neoplasm of glottis: Secondary | ICD-10-CM | POA: Diagnosis not present

## 2023-10-28 DIAGNOSIS — R0689 Other abnormalities of breathing: Secondary | ICD-10-CM

## 2023-10-28 LAB — RAD ONC ARIA SESSION SUMMARY
Course Elapsed Days: 23
Plan Fractions Treated to Date: 13
Plan Prescribed Dose Per Fraction: 2 Gy
Plan Total Fractions Prescribed: 30
Plan Total Prescribed Dose: 60 Gy
Reference Point Dosage Given to Date: 26 Gy
Reference Point Session Dosage Given: 2 Gy
Session Number: 13

## 2023-10-28 NOTE — Progress Notes (Signed)
Tracheostomy Procedure Note  RANVIR RENOVATO [id number] September 17, 1956  Pre Procedure Tracheostomy Information  Lary Tube 8/55   Procedure: Observation and Education of Lary Tube    Post Procedure Tracheostomy Information  Lary Tube 8/55   Post Procedure Evaluation: Observation and Evaluation    Education: Care and cleaing for Lary Tube  Prescription needs: none    Additional needs: Given a few suction catheters and cleaning kits at this visit

## 2023-10-28 NOTE — Consult Note (Signed)
NAME:  Garrett Fleming, MRN:  [id number], DOB:  July 02, 1956, LOS: 0 ADMISSION DATE:  (Not on file), CONSULTATION DATE:  1/29 REFERRING MD:  Basilio Cairo, CHIEF COMPLAINT:  airway management    History of Present Illness:  68 year old male patient with squamous cell carcinoma of the larynx now status post total laryngectomy and radical neck in November 2024, ECOG 2, seen by both oncology and radiation oncology, being treated with radiation alone.  Looks like he was last seen by ENT back in November at clinic.  Had an ER visit in the end of December for bloody output from laryngectomy site, and inability to replace Lary tube there was some stoma stenosis noted at that time necessitating downsize of Lary tube to 8/55.   Received first treatment of radiation on 10/06/2023, with plan to complete 6 total weeks.  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       No intake or output data in the 24 hours ending 10/28/23 1130 There were no vitals filed for this visit.  Examination: General: 68 year old male patient he is ambulatory and in no acute distress HENT: Normal cephalic temporal wasting mucous membranes moist laryngectomy site is unremarkable he currently has a Lary tube in place, size 8/55, he is aphonic Lungs: Some occasional rhonchi but no accessory use Cardiovascular: Regular rate and rhythm Abdomen: Soft, has a PEG, this is not new. Extremities: Warm and dry without  edema Neuro: Alert and oriented   Assessment & Plan:   Principal Problem:   H/O laryngectomy Active Problems:   Ineffective airway clearance   Squamous cell carcinoma of larynx (HCC)   H/O laryngectomy Overview: Completed in November 2024 Plan He will follow-up with [facility] to assist with Lary tube device supplies   History of radical neck surgery  Ineffective airway clearance Overview: This is really the reason Mr. Stoke has been referred to me.  He occasionally requires suction at home, and it seems as though having provider to order needed airway maintenance supplies was needed.  He did have some episodes of bloody tracheal secretions about 2 weeks ago these have since subsided.  Unfortunately he does not have suction catheters at home nor trach care kits.  He does have a suction machine. Plan [facility] am [facility] Have placed orders for suction catheters and tracheostomy care kits He and his wife have our office number, we are happy to assist in his care in regards to airway clearance, and DME applies required for this.     Labs   CBC: No results for input(s): "WBC", "NEUTROABS", "HGB", "HCT", "MCV", "PLT" in the last 168 hours.  Basic Metabolic Panel: No results for input(s): "NA", "K", "CL", "CO2", "GLUCOSE", "BUN", "CREATININE", "CALCIUM", "MG", "PHOS" in the last 168 hours. GFR: CrCl cannot be calculated (Patient's most recent lab result is older than the maximum 21 days allowed.). No results for input(s): "PROCALCITON", "WBC", "LATICACIDVEN" in the last 168 hours.  Liver Function Tests: No results for input(s): "  AST", "ALT", "ALKPHOS", "BILITOT", "PROT", "ALBUMIN" in the last 168 hours. No results for input(s): "LIPASE", "AMYLASE" in the last 168 hours. No results for input(s): "AMMONIA" in the last 168 hours.  ABG No results found for: "PHART", "PCO2ART", "PO2ART", "HCO3", "TCO2", "ACIDBASEDEF", "O2SAT"   Coagulation  Profile: No results for input(s): "INR", "PROTIME" in the last 168 hours.  Cardiac Enzymes: No results for input(s): "CKTOTAL", "CKMB", "CKMBINDEX", "TROPONINI" in the last 168 hours.  HbA1C: Hgb A1c MFr Bld  Date/Time Value Ref Range Status  11/18/2018 02:58 AM 4.9 4.8 - 5.6 % Final    Comment:    (NOTE)         Prediabetes: 5.7 - 6.4         Diabetes: >6.4         Glycemic control for adults with diabetes: <7.0     CBG: No results for input(s): "GLUCAP" in the last 168 hours.  Review of Systems:   Review of Systems  Constitutional:  Negative for chills, fever and malaise/fatigue.  HENT: Negative.    Eyes: Negative.   Respiratory: Negative.    Cardiovascular: Negative.   Gastrointestinal: Negative.   Genitourinary: Negative.   Musculoskeletal: Negative.   Skin: Negative.   Neurological: Negative.   Endo/Heme/Allergies: Negative.      Past Medical History:  He,  has a past medical history of Ankle fracture, left, Arthritis, Ascending aortic aneurysm (HCC) (01/21/2019), Blind right eye, Cocaine abuse (HCC) (11/17/2018), Coronary artery calcification seen on CAT scan (01/21/2019), Discomfort in chest, Essential hypertension (11/17/2018), Fracture of lateral malleolus of left ankle (12/02/2013), Keloid (11/16/2018), Penetrating atherosclerotic ulcer of aorta (HCC) (11/16/2018), and Smoking (11/17/2018).   Surgical History:   Past Surgical History:  Procedure Laterality Date   BIOPSY  07/03/2023   Procedure: BIOPSY;  Surgeon: Shellia Cleverly, DO;  Location: [facility];  Service: Gastroenterology;;   CAROTID-SUBCLAVIAN BYPASS GRAFT Left 11/26/2018   Procedure: LEFT CAROTID TO SUBCLAVIAN ARTERY TRANSPOSITION;  Surgeon: Nada Libman, MD;  Location: [facility] OR;  Service: Vascular;  Laterality: Left;   ESOPHAGOGASTRODUODENOSCOPY (EGD) WITH PROPOFOL N/A 07/03/2023   Procedure: ESOPHAGOGASTRODUODENOSCOPY (EGD) WITH PROPOFOL;  Surgeon: Shellia Cleverly, DO;  Location: [facility];   Service: Gastroenterology;  Laterality: N/A;   KENALOG INJECTION Right 04/25/2020   Procedure: kenalog injection to right shoulder keloid;  Surgeon: Peggye Form, DO;  Location: [facility];  Service: Plastics;  Laterality: Right;   LESION REMOVAL Left 04/25/2020   Procedure: Excision of keloid/changing skin lesion of scalp;  Surgeon: Peggye Form, DO;  Location: [facility];  Service: Plastics;  Laterality: Left;  1 hour total, please   ORIF ANKLE FRACTURE Left 12/02/2013   Procedure: OPEN REDUCTION INTERNAL FIXATION (ORIF) LEFT ANKLE FRACTURE;  Surgeon: Kathryne Hitch, MD;  Location: [facility];  Service: Orthopedics;  Laterality: Left;   right eye surgery   1977   THORACIC AORTIC ENDOVASCULAR STENT GRAFT N/A 11/26/2018   Procedure: THORACIC AORTIC ENDOVASCULAR STENT using a GORE TAG CONFORMABLE THORACIC GRAFT;  Surgeon: Nada Libman, MD;  Location: [facility] OR;  Service: Vascular;  Laterality: N/A;   TRACHEOSTOMY TUBE PLACEMENT N/A 06/21/2023   Procedure: TRACHEOSTOMY;  Surgeon: Scarlette Ar, MD;  Location: [facility] OR;  Service: ENT;  Laterality: N/A;   ULTRASOUND GUIDANCE FOR VASCULAR ACCESS Bilateral 11/26/2018   Procedure: Ultrasound Guidance For Vascular Access;  Surgeon: Nada Libman, MD;  [facility];  Service: Vascular;  Laterality: Bilateral;  Social History:   reports that he has quit smoking. His smoking use included cigarettes. He has a 5 pack-year smoking history. He has been exposed to tobacco smoke. He has never used smokeless tobacco. He reports that he does not currently use alcohol. He reports that he does not currently use drugs after having used the following drugs: Cocaine and Marijuana.   Family History:  His family history is not on file.   Allergies Allergies  Allergen Reactions   Lisinopril Swelling     Home Medications  Prior to Admission medications   Medication Sig Start Date End Date Taking? Authorizing  Provider  carvedilol (COREG) 3.125 MG tablet Take 3.125 mg by mouth 2 (two) times daily with a meal. 07/24/23   [provider]  celecoxib (CELEBREX) 200 MG capsule Take 200 mg by mouth 2 (two) times daily.    [provider]  famotidine (PEPCID) 40 MG/5ML suspension Take 40 mg by mouth daily.    [provider]  guaiFENesin (ROBITUSSIN) 100 MG/5ML liquid Take 10 mLs by mouth every 6 (six) hours as needed for cough or to loosen phlegm. Ok to give per feeding tube. 10/16/23   Lonie Peak, MD  lidocaine (XYLOCAINE) 2 % solution Patient: Mix 1part 2% viscous lidocaine, 1part H20. Swallow 10mL of diluted mixture, before meals and at bedtime, up to QID 10/05/23   Lonie Peak, MD  Nutritional Supplements (FEEDING SUPPLEMENT, OSMOLITE 1.5 CAL,) LIQD Place 1,000 mLs into feeding tube continuous. 07/05/23   Leroy Sea, MD  oxyCODONE (ROXICODONE) 5 MG/5ML solution Place 5 mg into feeding tube every 4 (four) hours as needed for moderate pain or severe pain. 05/12/23   [provider]  pantoprazole (PROTONIX) 40 MG injection Inject 40 mg into the vein every 12 (twelve) hours. 07/05/23   Leroy Sea, MD  Protein (FEEDING SUPPLEMENT, PROSOURCE TF20,) liquid Place 60 mLs into feeding tube daily. 07/06/23   Leroy Sea, MD  scopolamine (TRANSDERM-SCOP) 1 MG/3DAYS Place 1 patch (1.5 mg total) onto the skin every 3 (three) days. 07/08/23   Leroy Sea, MD  sucralfate (CARAFATE) 1 GM/10ML suspension Place 1 g into feeding tube 4 (four) times daily. 08/20/23 09/19/23  [provider]  lisinopril (ZESTRIL) 10 MG tablet Take 1 tablet (10 mg total) by mouth daily. 10/19/19 02/13/20  Quintella Reichert, MD     My time  32 minutes

## 2023-10-29 ENCOUNTER — Ambulatory Visit
Admission: RE | Admit: 2023-10-29 | Discharge: 2023-10-29 | Disposition: A | Discharge status: Home / Self Care | Payer: Medicare HMO | Source: Ambulatory Visit | Attending: Radiation Oncology | Admitting: Radiation Oncology

## 2023-10-29 ENCOUNTER — Other Ambulatory Visit: Payer: Self-pay

## 2023-10-29 DIAGNOSIS — Z510 Encounter for antineoplastic radiation therapy: Secondary | ICD-10-CM | POA: Diagnosis not present

## 2023-10-29 DIAGNOSIS — C320 Malignant neoplasm of glottis: Secondary | ICD-10-CM | POA: Diagnosis not present

## 2023-10-29 LAB — RAD ONC ARIA SESSION SUMMARY
Course Elapsed Days: 24
Plan Fractions Treated to Date: 14
Plan Prescribed Dose Per Fraction: 2 Gy
Plan Total Fractions Prescribed: 30
Plan Total Prescribed Dose: 60 Gy
Reference Point Dosage Given to Date: 28 Gy
Reference Point Session Dosage Given: 2 Gy
Session Number: 14

## 2023-10-29 NOTE — Telephone Encounter (Signed)
Pt has not been seen by [place]. Do orders still need to be placed? Or does the pt need to be scheduled? Forwarding to [facility] for advise.

## 2023-10-29 NOTE — Telephone Encounter (Signed)
Order has been placed to [facility] for supplies.  Routing to [place] to make him aware to sign order. Unable to send epic secure chat due being offline.  Nothing further needed.

## 2023-10-30 ENCOUNTER — Ambulatory Visit
Admission: RE | Admit: 2023-10-30 | Discharge: 2023-10-30 | Disposition: A | Discharge status: Home / Self Care | Payer: Medicare HMO | Source: Ambulatory Visit | Attending: Radiation Oncology | Admitting: Radiation Oncology

## 2023-10-30 ENCOUNTER — Ambulatory Visit
Admission: RE | Admit: 2023-10-30 | Discharge: 2023-10-30 | Disposition: A | Discharge status: Home / Self Care | Payer: Medicare HMO | Source: Ambulatory Visit | Attending: Radiation Oncology

## 2023-10-30 ENCOUNTER — Inpatient Hospital Stay: Payer: Medicare HMO | Admitting: Dietician

## 2023-10-30 ENCOUNTER — Other Ambulatory Visit: Payer: Self-pay

## 2023-10-30 DIAGNOSIS — C320 Malignant neoplasm of glottis: Secondary | ICD-10-CM | POA: Diagnosis not present

## 2023-10-30 DIAGNOSIS — Z510 Encounter for antineoplastic radiation therapy: Secondary | ICD-10-CM | POA: Diagnosis not present

## 2023-10-30 LAB — RAD ONC ARIA SESSION SUMMARY
Course Elapsed Days: 25
Plan Fractions Treated to Date: 15
Plan Prescribed Dose Per Fraction: 2 Gy
Plan Total Fractions Prescribed: 30
Plan Total Prescribed Dose: 60 Gy
Reference Point Dosage Given to Date: 30 Gy
Reference Point Session Dosage Given: 2 Gy
Session Number: 15

## 2023-10-30 NOTE — Progress Notes (Signed)
Nutrition Follow-up:  Patient with Larynex cancer. S/p bilateral neck dissection with flap/graft. Trach in place. He is receiving radiation therapy under the care of Dr. Basilio Cairo.   Met with patient and wife in office prior to treatment. Patient reports increased sore throat and pain with swallowing. This has not affected his appetite per wife. He is eating a variety of foods orally. Yesterday had oatmeal, fried chicken, mashed potatoes, beans, roll and 2 Ensure. Patient reports 3-4 bottles of water. He is also drinking soda per wife. Patient giving 2 cartons of Osmolite via tube. He is tolerating formula. Wife reports she does not know who DME to order additional formula/supplies. Tube placed at [facility]. RD to review chart and will contact wife with DME information. She is requesting additional samples of Ensure and Osmolite.    Medications: reviewed   Labs: no new labs  Anthropometrics: Last wt 172.8 lb on 1/28   1/13 - 171.2 lb    NUTRITION DIAGNOSIS: Predicted suboptimal intake ongoing    INTERVENTION:  Continue soft moist textures as tolerated. Encouraged high calorie high protein foods Continue 2 Ensure Complete in between meals - samples provided  Continue 2 cartons Osmolite 1.5 via tube - samples provided  Will contact wife with DME pending chart review     MONITORING, EVALUATION, GOAL: wt trends, intake   NEXT VISIT: Friday February 7 prior to treatment

## 2023-11-02 ENCOUNTER — Ambulatory Visit: Payer: Medicare HMO

## 2023-11-02 ENCOUNTER — Other Ambulatory Visit: Payer: Self-pay

## 2023-11-02 ENCOUNTER — Ambulatory Visit
Admission: RE | Admit: 2023-11-02 | Discharge: 2023-11-02 | Disposition: A | Discharge status: Home / Self Care | Payer: Medicare HMO | Source: Ambulatory Visit | Attending: Radiation Oncology | Admitting: Radiation Oncology

## 2023-11-02 DIAGNOSIS — C320 Malignant neoplasm of glottis: Secondary | ICD-10-CM | POA: Insufficient documentation

## 2023-11-02 DIAGNOSIS — Z510 Encounter for antineoplastic radiation therapy: Secondary | ICD-10-CM | POA: Insufficient documentation

## 2023-11-02 LAB — RAD ONC ARIA SESSION SUMMARY
Course Elapsed Days: 28
Plan Fractions Treated to Date: 16
Plan Prescribed Dose Per Fraction: 2 Gy
Plan Total Fractions Prescribed: 30
Plan Total Prescribed Dose: 60 Gy
Reference Point Dosage Given to Date: 32 Gy
Reference Point Session Dosage Given: 2 Gy
Session Number: 16

## 2023-11-03 ENCOUNTER — Ambulatory Visit: Payer: Medicare HMO

## 2023-11-03 ENCOUNTER — Other Ambulatory Visit: Payer: Self-pay

## 2023-11-03 ENCOUNTER — Ambulatory Visit
Admission: RE | Admit: 2023-11-03 | Discharge: 2023-11-03 | Disposition: A | Discharge status: Home / Self Care | Payer: Medicare HMO | Source: Ambulatory Visit | Attending: Radiation Oncology

## 2023-11-03 DIAGNOSIS — Z510 Encounter for antineoplastic radiation therapy: Secondary | ICD-10-CM | POA: Diagnosis not present

## 2023-11-03 DIAGNOSIS — C320 Malignant neoplasm of glottis: Secondary | ICD-10-CM | POA: Diagnosis not present

## 2023-11-03 LAB — RAD ONC ARIA SESSION SUMMARY
Course Elapsed Days: 29
Plan Fractions Treated to Date: 17
Plan Prescribed Dose Per Fraction: 2 Gy
Plan Total Fractions Prescribed: 30
Plan Total Prescribed Dose: 60 Gy
Reference Point Dosage Given to Date: 34 Gy
Reference Point Session Dosage Given: 2 Gy
Session Number: 17

## 2023-11-04 ENCOUNTER — Ambulatory Visit
Admission: RE | Admit: 2023-11-04 | Discharge: 2023-11-04 | Disposition: A | Discharge status: Home / Self Care | Payer: Medicare HMO | Source: Ambulatory Visit | Attending: Radiation Oncology | Admitting: Radiation Oncology

## 2023-11-04 ENCOUNTER — Ambulatory Visit: Payer: Medicare HMO

## 2023-11-04 ENCOUNTER — Other Ambulatory Visit: Payer: Self-pay

## 2023-11-04 DIAGNOSIS — C320 Malignant neoplasm of glottis: Secondary | ICD-10-CM | POA: Diagnosis not present

## 2023-11-04 DIAGNOSIS — Z510 Encounter for antineoplastic radiation therapy: Secondary | ICD-10-CM | POA: Diagnosis not present

## 2023-11-04 LAB — RAD ONC ARIA SESSION SUMMARY
Course Elapsed Days: 30
Plan Fractions Treated to Date: 18
Plan Prescribed Dose Per Fraction: 2 Gy
Plan Total Fractions Prescribed: 30
Plan Total Prescribed Dose: 60 Gy
Reference Point Dosage Given to Date: 36 Gy
Reference Point Session Dosage Given: 2 Gy
Session Number: 18
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
## 2023-11-05 ENCOUNTER — Other Ambulatory Visit: Payer: Self-pay

## 2023-11-05 ENCOUNTER — Encounter: Payer: Self-pay | Admitting: Physical Therapy

## 2023-11-05 ENCOUNTER — Ambulatory Visit: Payer: Medicare HMO | Attending: Radiation Oncology | Admitting: Physical Therapy

## 2023-11-05 ENCOUNTER — Ambulatory Visit: Payer: Medicare HMO

## 2023-11-05 ENCOUNTER — Ambulatory Visit
Admission: RE | Admit: 2023-11-05 | Discharge: 2023-11-05 | Disposition: A | Discharge status: Home / Self Care | Payer: Medicare HMO | Source: Ambulatory Visit | Attending: Radiation Oncology | Admitting: Radiation Oncology

## 2023-11-05 DIAGNOSIS — Z510 Encounter for antineoplastic radiation therapy: Secondary | ICD-10-CM | POA: Diagnosis not present

## 2023-11-05 DIAGNOSIS — C320 Malignant neoplasm of glottis: Secondary | ICD-10-CM | POA: Diagnosis not present

## 2023-11-05 DIAGNOSIS — C329 Malignant neoplasm of larynx, unspecified: Secondary | ICD-10-CM | POA: Insufficient documentation

## 2023-11-05 DIAGNOSIS — R293 Abnormal posture: Secondary | ICD-10-CM | POA: Diagnosis not present

## 2023-11-05 LAB — RAD ONC ARIA SESSION SUMMARY
Course Elapsed Days: 31
Plan Fractions Treated to Date: 19
Plan Prescribed Dose Per Fraction: 2 Gy
Plan Total Fractions Prescribed: 30
Plan Total Prescribed Dose: 60 Gy
Reference Point Dosage Given to Date: 38 Gy
Reference Point Session Dosage Given: 2 Gy
Session Number: 19

## 2023-11-06 ENCOUNTER — Ambulatory Visit
Admission: RE | Admit: 2023-11-06 | Discharge: 2023-11-06 | Disposition: A | Discharge status: Home / Self Care | Payer: Medicare HMO | Source: Ambulatory Visit | Attending: Radiation Oncology

## 2023-11-06 ENCOUNTER — Other Ambulatory Visit: Payer: Self-pay

## 2023-11-06 ENCOUNTER — Inpatient Hospital Stay: Payer: Medicare HMO | Attending: Oncology | Admitting: Dietician

## 2023-11-06 DIAGNOSIS — C320 Malignant neoplasm of glottis: Secondary | ICD-10-CM | POA: Diagnosis not present

## 2023-11-06 DIAGNOSIS — Z510 Encounter for antineoplastic radiation therapy: Secondary | ICD-10-CM | POA: Diagnosis not present

## 2023-11-06 LAB — RAD ONC ARIA SESSION SUMMARY
Course Elapsed Days: 32
Plan Fractions Treated to Date: 20
Plan Prescribed Dose Per Fraction: 2 Gy
Plan Total Fractions Prescribed: 30
Plan Total Prescribed Dose: 60 Gy
Reference Point Dosage Given to Date: 40 Gy
Reference Point Session Dosage Given: 2 Gy
Session Number: 20

## 2023-11-06 NOTE — Progress Notes (Signed)
 Nutrition Follow-up:  Patient with Larynex cancer. S/p bilateral neck dissection with flap/graft. Trach in place. He is receiving radiation therapy under the care of Dr. Izell.   DME: [employer]    Met with patient and wife in office prior to radiation. Patient reports increased sore throat. He is using viscous lidocaine  for this. Says it helps a little bit. He has a good appetite and eating well. Recalls hamburger, cabbage, mashed potatoes, and squash for dinner. Had cereal, pancakes, and sausage for breakfast. Patient is drinking 2 Ensure Complete. He is giving 1-2 cartons Osmolite 1.5. Patient drinks a ton of water per wife. Patient has intermittent nausea due to thick saliva. Denies vomiting. He reports hard stool, but having daily bowel movement.    Medications: reviewed   Labs: no new labs   Anthropometrics: Last wt 170 lb on 2/3 (aria) decreased   1/28 - 172.8 lb  1/13 - 171.2 lb    NUTRITION DIAGNOSIS: Predicted suboptimal intake continues    INTERVENTION:  RD spoke with Garrett Fleming at [facility] regarding shipment of formula/supplies - enteral team to contact wife of pt (telephone number provided) Continue soft moist textures Continue baking soda salt water rinses Continue 2 Ensure Complete - samples provided Continue 2 Osmolite 1.5 via tube    MONITORING, EVALUATION, GOAL: wt trends, intake, TF   NEXT VISIT: Thursday February 13 before RT (card with appt time provided)

## 2023-11-09 ENCOUNTER — Other Ambulatory Visit: Payer: Self-pay

## 2023-11-09 ENCOUNTER — Ambulatory Visit
Admission: RE | Admit: 2023-11-09 | Discharge: 2023-11-09 | Disposition: A | Discharge status: Home / Self Care | Payer: Medicare HMO | Source: Ambulatory Visit | Attending: Radiation Oncology | Admitting: Radiation Oncology

## 2023-11-09 ENCOUNTER — Ambulatory Visit
Admission: RE | Admit: 2023-11-09 | Discharge: 2023-11-09 | Disposition: A | Discharge status: Home / Self Care | Payer: Medicare HMO | Source: Ambulatory Visit | Attending: Radiation Oncology

## 2023-11-09 ENCOUNTER — Other Ambulatory Visit: Payer: Self-pay | Admitting: Radiation Oncology

## 2023-11-09 ENCOUNTER — Ambulatory Visit: Payer: Medicare HMO

## 2023-11-09 DIAGNOSIS — I7121 Aneurysm of the ascending aorta, without rupture: Secondary | ICD-10-CM | POA: Diagnosis not present

## 2023-11-09 DIAGNOSIS — I2583 Coronary atherosclerosis due to lipid rich plaque: Secondary | ICD-10-CM | POA: Diagnosis not present

## 2023-11-09 DIAGNOSIS — Z510 Encounter for antineoplastic radiation therapy: Secondary | ICD-10-CM | POA: Diagnosis not present

## 2023-11-09 DIAGNOSIS — C320 Malignant neoplasm of glottis: Secondary | ICD-10-CM | POA: Diagnosis not present

## 2023-11-09 DIAGNOSIS — I2510 Atherosclerotic heart disease of native coronary artery without angina pectoris: Secondary | ICD-10-CM | POA: Diagnosis not present

## 2023-11-09 DIAGNOSIS — I10 Essential (primary) hypertension: Secondary | ICD-10-CM | POA: Diagnosis not present

## 2023-11-09 DIAGNOSIS — C329 Malignant neoplasm of larynx, unspecified: Secondary | ICD-10-CM

## 2023-11-09 LAB — RAD ONC ARIA SESSION SUMMARY
Course Elapsed Days: 35
Plan Fractions Treated to Date: 21
Plan Prescribed Dose Per Fraction: 2 Gy
Plan Total Fractions Prescribed: 30
Plan Total Prescribed Dose: 60 Gy
Reference Point Dosage Given to Date: 42 Gy
Reference Point Session Dosage Given: 2 Gy
Session Number: 21

## 2023-11-10 ENCOUNTER — Ambulatory Visit
Admission: RE | Admit: 2023-11-10 | Discharge: 2023-11-10 | Disposition: A | Discharge status: Home / Self Care | Payer: Medicare HMO | Source: Ambulatory Visit | Attending: Radiation Oncology | Admitting: Radiation Oncology

## 2023-11-10 ENCOUNTER — Ambulatory Visit: Payer: Medicare HMO

## 2023-11-10 ENCOUNTER — Ambulatory Visit: Payer: Medicare HMO | Attending: Cardiology | Admitting: Cardiology

## 2023-11-10 ENCOUNTER — Other Ambulatory Visit: Payer: Self-pay

## 2023-11-10 ENCOUNTER — Encounter: Payer: Self-pay | Admitting: Cardiology

## 2023-11-10 VITALS — BP 138/82 | HR 50 | Ht 76.0 in | Wt 168.8 lb

## 2023-11-10 DIAGNOSIS — Z510 Encounter for antineoplastic radiation therapy: Secondary | ICD-10-CM | POA: Diagnosis not present

## 2023-11-10 DIAGNOSIS — I2510 Atherosclerotic heart disease of native coronary artery without angina pectoris: Secondary | ICD-10-CM

## 2023-11-10 DIAGNOSIS — I2583 Coronary atherosclerosis due to lipid rich plaque: Secondary | ICD-10-CM | POA: Diagnosis not present

## 2023-11-10 DIAGNOSIS — C320 Malignant neoplasm of glottis: Secondary | ICD-10-CM | POA: Diagnosis not present

## 2023-11-10 DIAGNOSIS — I10 Essential (primary) hypertension: Secondary | ICD-10-CM | POA: Diagnosis not present

## 2023-11-10 DIAGNOSIS — I7121 Aneurysm of the ascending aorta, without rupture: Secondary | ICD-10-CM

## 2023-11-10 LAB — RAD ONC ARIA SESSION SUMMARY
Course Elapsed Days: 36
Plan Fractions Treated to Date: 22
Plan Prescribed Dose Per Fraction: 2 Gy
Plan Total Fractions Prescribed: 30
Plan Total Prescribed Dose: 60 Gy
Reference Point Dosage Given to Date: 44 Gy
Reference Point Session Dosage Given: 2 Gy
Session Number: 22

## 2023-11-10 MED ORDER — CARVEDILOL 3.125 MG PO TABS: 3.1250 mg | ORAL_TABLET | Freq: Two times a day (BID) | ORAL | 3 refills | Status: DC

## 2023-11-10 MED ORDER — ASPIRIN 81 MG PO TBEC: 81.0000 mg | DELAYED_RELEASE_TABLET | Freq: Every day | ORAL | 3 refills | Status: AC

## 2023-11-10 NOTE — Patient Instructions (Signed)
Medication Instructions:  Start Carvedilol 3.125 mg twice daily Start Aspirin 81 mg once daily *If you need a refill on your cardiac medications before your next appointment, please call your pharmacy*   Lab Work: None If you have labs (blood work) drawn today and your tests are completely normal, you will receive your results only by: MyChart Message (if you have MyChart) OR A paper copy in the mail If you have any lab test that is abnormal or we need to change your treatment, we will call you to review the results.   Testing/Procedures: None   Follow-Up: At [facility], you and your health needs are our priority.  As part of our continuing mission to provide you with exceptional heart care, we have created designated Provider Care Teams.  These Care Teams include your primary Cardiologist (physician) and Advanced Practice Providers (APPs -  Physician Assistants and Nurse Practitioners) who all work together to provide you with the care you need, when you need it.  We recommend signing up for the patient portal called "MyChart".  Sign up information is provided on this After Visit Summary.  MyChart is used to connect with patients for Virtual Visits (Telemedicine).  Patients are able to view lab/test results, encounter notes, upcoming appointments, etc.  Non-urgent messages can be sent to your provider as well.   To learn more about what you can do with MyChart, go to [email or web address].    Your next appointment:   1 year(s)  Provider:   Armanda Magic, MD     Other Instructions   1st Floor: - Lobby - Registration  - Pharmacy  - Lab - Cafe  2nd Floor: - PV Lab - Diagnostic Testing (echo, CT, nuclear med)  3rd Floor: - Vacant  4th Floor: - TCTS (cardiothoracic surgery) - AFib Clinic - Structural Heart Clinic - Vascular Surgery  - Vascular Ultrasound  5th Floor: - HeartCare Cardiology (general and EP) - Clinical Pharmacy for coumadin, hypertension,  lipid, weight-loss medications, and med management appointments    Valet parking services will be available as well.
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
## 2023-11-10 NOTE — Addendum Note (Signed)
Addended by: Erick Alley on: 11/10/2023 02:26 PM   Modules accepted: Orders

## 2023-11-11 ENCOUNTER — Other Ambulatory Visit: Payer: Self-pay

## 2023-11-11 ENCOUNTER — Ambulatory Visit: Payer: Medicare HMO

## 2023-11-11 ENCOUNTER — Inpatient Hospital Stay: Payer: Medicare HMO

## 2023-11-11 ENCOUNTER — Ambulatory Visit
Admission: RE | Admit: 2023-11-11 | Discharge: 2023-11-11 | Disposition: A | Discharge status: Home / Self Care | Payer: Medicare HMO | Source: Ambulatory Visit | Attending: Radiation Oncology

## 2023-11-11 DIAGNOSIS — Z510 Encounter for antineoplastic radiation therapy: Secondary | ICD-10-CM | POA: Diagnosis not present

## 2023-11-11 DIAGNOSIS — C320 Malignant neoplasm of glottis: Secondary | ICD-10-CM | POA: Diagnosis not present

## 2023-11-11 LAB — RAD ONC ARIA SESSION SUMMARY
Course Elapsed Days: 37
Plan Fractions Treated to Date: 23
Plan Prescribed Dose Per Fraction: 2 Gy
Plan Total Fractions Prescribed: 30
Plan Total Prescribed Dose: 60 Gy
Reference Point Dosage Given to Date: 46 Gy
Reference Point Session Dosage Given: 2 Gy
Session Number: 23

## 2023-11-12 ENCOUNTER — Inpatient Hospital Stay: Payer: Medicare HMO

## 2023-11-12 ENCOUNTER — Inpatient Hospital Stay: Payer: Medicare HMO | Admitting: Dietician

## 2023-11-12 ENCOUNTER — Ambulatory Visit
Admission: RE | Admit: 2023-11-12 | Discharge: 2023-11-12 | Disposition: A | Discharge status: Home / Self Care | Payer: Medicare HMO | Source: Ambulatory Visit | Attending: Radiation Oncology

## 2023-11-12 ENCOUNTER — Ambulatory Visit: Payer: Medicare HMO

## 2023-11-12 ENCOUNTER — Other Ambulatory Visit: Payer: Self-pay

## 2023-11-12 DIAGNOSIS — C320 Malignant neoplasm of glottis: Secondary | ICD-10-CM | POA: Diagnosis not present

## 2023-11-12 DIAGNOSIS — Z510 Encounter for antineoplastic radiation therapy: Secondary | ICD-10-CM | POA: Diagnosis not present

## 2023-11-12 LAB — RAD ONC ARIA SESSION SUMMARY
Course Elapsed Days: 38
Plan Fractions Treated to Date: 24
Plan Prescribed Dose Per Fraction: 2 Gy
Plan Total Fractions Prescribed: 30
Plan Total Prescribed Dose: 60 Gy
Reference Point Dosage Given to Date: 48 Gy
Reference Point Session Dosage Given: 2 Gy
Session Number: 24

## 2023-11-12 NOTE — Progress Notes (Signed)
Patient arrived 45 minutes late for nutrition appointment. This RD provided appointment date and time to pt and wife at 2/7 visit. Cancelled appointment as RD did not have additional opening today.   Will plan to see patient as scheduled on 2/21

## 2023-11-13 ENCOUNTER — Ambulatory Visit: Payer: Medicare HMO

## 2023-11-13 ENCOUNTER — Other Ambulatory Visit: Payer: Self-pay

## 2023-11-13 ENCOUNTER — Ambulatory Visit
Admission: RE | Admit: 2023-11-13 | Discharge: 2023-11-13 | Disposition: A | Discharge status: Home / Self Care | Payer: Medicare HMO | Source: Ambulatory Visit | Attending: Radiation Oncology | Admitting: Radiation Oncology

## 2023-11-13 ENCOUNTER — Inpatient Hospital Stay: Payer: Medicare HMO

## 2023-11-13 DIAGNOSIS — C320 Malignant neoplasm of glottis: Secondary | ICD-10-CM | POA: Diagnosis not present

## 2023-11-13 DIAGNOSIS — Z510 Encounter for antineoplastic radiation therapy: Secondary | ICD-10-CM | POA: Diagnosis not present

## 2023-11-13 LAB — RAD ONC ARIA SESSION SUMMARY
Course Elapsed Days: 39
Plan Fractions Treated to Date: 25
Plan Prescribed Dose Per Fraction: 2 Gy
Plan Total Fractions Prescribed: 30
Plan Total Prescribed Dose: 60 Gy
Reference Point Dosage Given to Date: 50 Gy
Reference Point Session Dosage Given: 2 Gy
Session Number: 25

## 2023-11-16 ENCOUNTER — Other Ambulatory Visit: Payer: Self-pay | Admitting: Radiation Oncology

## 2023-11-16 ENCOUNTER — Other Ambulatory Visit: Payer: Self-pay

## 2023-11-16 ENCOUNTER — Ambulatory Visit
Admission: RE | Admit: 2023-11-16 | Discharge: 2023-11-16 | Disposition: A | Discharge status: Home / Self Care | Payer: Medicare HMO | Source: Ambulatory Visit | Attending: Radiation Oncology

## 2023-11-16 ENCOUNTER — Ambulatory Visit: Payer: Medicare HMO

## 2023-11-16 ENCOUNTER — Ambulatory Visit
Admission: RE | Admit: 2023-11-16 | Discharge: 2023-11-16 | Disposition: A | Discharge status: Home / Self Care | Payer: Medicare HMO | Source: Ambulatory Visit | Attending: Radiation Oncology | Admitting: Radiation Oncology

## 2023-11-16 DIAGNOSIS — C329 Malignant neoplasm of larynx, unspecified: Secondary | ICD-10-CM

## 2023-11-16 DIAGNOSIS — Z510 Encounter for antineoplastic radiation therapy: Secondary | ICD-10-CM | POA: Diagnosis not present

## 2023-11-16 DIAGNOSIS — C320 Malignant neoplasm of glottis: Secondary | ICD-10-CM | POA: Diagnosis not present

## 2023-11-16 LAB — RAD ONC ARIA SESSION SUMMARY
Course Elapsed Days: 42
Plan Fractions Treated to Date: 26
Plan Prescribed Dose Per Fraction: 2 Gy
Plan Total Fractions Prescribed: 30
Plan Total Prescribed Dose: 60 Gy
Reference Point Dosage Given to Date: 52 Gy
Reference Point Session Dosage Given: 2 Gy
Session Number: 26

## 2023-11-16 MED ORDER — HYDROCODONE-ACETAMINOPHEN 7.5-325 MG/15ML PO SOLN
10.0000 mL | Freq: Four times a day (QID) | ORAL | 0 refills | Status: DC | PRN
Start: 2023-11-16 — End: 2024-07-08

## 2023-11-17 ENCOUNTER — Ambulatory Visit: Payer: Medicare HMO

## 2023-11-17 ENCOUNTER — Other Ambulatory Visit: Payer: Self-pay

## 2023-11-17 ENCOUNTER — Ambulatory Visit
Admission: RE | Admit: 2023-11-17 | Discharge: 2023-11-17 | Disposition: A | Discharge status: Home / Self Care | Payer: Medicare HMO | Source: Ambulatory Visit | Attending: Radiation Oncology

## 2023-11-17 DIAGNOSIS — C320 Malignant neoplasm of glottis: Secondary | ICD-10-CM | POA: Diagnosis not present

## 2023-11-17 DIAGNOSIS — Z510 Encounter for antineoplastic radiation therapy: Secondary | ICD-10-CM | POA: Diagnosis not present

## 2023-11-17 LAB — RAD ONC ARIA SESSION SUMMARY
Course Elapsed Days: 43
Plan Fractions Treated to Date: 27
Plan Prescribed Dose Per Fraction: 2 Gy
Plan Total Fractions Prescribed: 30
Plan Total Prescribed Dose: 60 Gy
Reference Point Dosage Given to Date: 54 Gy
Reference Point Session Dosage Given: 2 Gy
Session Number: 27

## 2023-11-18 ENCOUNTER — Other Ambulatory Visit: Payer: Self-pay

## 2023-11-18 ENCOUNTER — Ambulatory Visit
Admission: RE | Admit: 2023-11-18 | Discharge: 2023-11-18 | Disposition: A | Discharge status: Home / Self Care | Payer: Medicare HMO | Source: Ambulatory Visit | Attending: Radiation Oncology

## 2023-11-18 ENCOUNTER — Ambulatory Visit: Payer: Medicare HMO

## 2023-11-18 ENCOUNTER — Inpatient Hospital Stay: Payer: Medicare HMO

## 2023-11-18 DIAGNOSIS — Z510 Encounter for antineoplastic radiation therapy: Secondary | ICD-10-CM | POA: Diagnosis not present

## 2023-11-18 DIAGNOSIS — C320 Malignant neoplasm of glottis: Secondary | ICD-10-CM | POA: Diagnosis not present

## 2023-11-18 LAB — RAD ONC ARIA SESSION SUMMARY
Course Elapsed Days: 44
Plan Fractions Treated to Date: 28
Plan Prescribed Dose Per Fraction: 2 Gy
Plan Total Fractions Prescribed: 30
Plan Total Prescribed Dose: 60 Gy
Reference Point Dosage Given to Date: 56 Gy
Reference Point Session Dosage Given: 2 Gy
Session Number: 28

## 2023-11-19 ENCOUNTER — Ambulatory Visit
Admission: RE | Admit: 2023-11-19 | Discharge: 2023-11-19 | Disposition: A | Discharge status: Home / Self Care | Payer: Medicare HMO | Source: Ambulatory Visit | Attending: Radiation Oncology | Admitting: Radiation Oncology

## 2023-11-19 ENCOUNTER — Ambulatory Visit: Payer: Medicare HMO

## 2023-11-19 ENCOUNTER — Other Ambulatory Visit: Payer: Self-pay

## 2023-11-19 DIAGNOSIS — Z510 Encounter for antineoplastic radiation therapy: Secondary | ICD-10-CM | POA: Diagnosis not present

## 2023-11-19 DIAGNOSIS — C320 Malignant neoplasm of glottis: Secondary | ICD-10-CM | POA: Diagnosis not present

## 2023-11-19 LAB — RAD ONC ARIA SESSION SUMMARY
Course Elapsed Days: 45
Plan Fractions Treated to Date: 29
Plan Prescribed Dose Per Fraction: 2 Gy
Plan Total Fractions Prescribed: 30
Plan Total Prescribed Dose: 60 Gy
Reference Point Dosage Given to Date: 58 Gy
Reference Point Session Dosage Given: 2 Gy
Session Number: 29

## 2023-11-20 ENCOUNTER — Ambulatory Visit
Admission: RE | Admit: 2023-11-20 | Discharge: 2023-11-20 | Disposition: A | Discharge status: Home / Self Care | Payer: Medicare HMO | Source: Ambulatory Visit | Attending: Radiation Oncology | Admitting: Radiation Oncology

## 2023-11-20 ENCOUNTER — Inpatient Hospital Stay: Payer: Medicare HMO

## 2023-11-20 ENCOUNTER — Inpatient Hospital Stay: Payer: Medicare HMO | Admitting: Dietician

## 2023-11-20 ENCOUNTER — Encounter: Payer: Self-pay | Admitting: *Deleted

## 2023-11-20 ENCOUNTER — Other Ambulatory Visit: Payer: Self-pay

## 2023-11-20 DIAGNOSIS — C320 Malignant neoplasm of glottis: Secondary | ICD-10-CM | POA: Diagnosis not present

## 2023-11-20 DIAGNOSIS — Z510 Encounter for antineoplastic radiation therapy: Secondary | ICD-10-CM | POA: Diagnosis not present

## 2023-11-20 LAB — RAD ONC ARIA SESSION SUMMARY
Course Elapsed Days: 46
Plan Fractions Treated to Date: 30
Plan Prescribed Dose Per Fraction: 2 Gy
Plan Total Fractions Prescribed: 30
Plan Total Prescribed Dose: 60 Gy
Reference Point Dosage Given to Date: 60 Gy
Reference Point Session Dosage Given: 2 Gy
Session Number: 30

## 2023-11-20 NOTE — Progress Notes (Signed)
Nutrition Follow-up:  Patient with Larynex cancer. S/p bilateral neck dissection with flap/graft. Trach in place. He is receiving radiation therapy under the care of Dr. Basilio Cairo. Final RT 2/21   DME: [employer]   Met with patient and wife in office prior to final radiation therapy. He reports worsening sore throat. He is taking Hycet which has been helping. Wife states he slept well last night. Patient continues to have a good appetite. He is tolerating soft moist textures. Last night had boiled chicken, mashed potatoes, green beans in broth. He is drinking 2 Ensure Complete by mouth. He is giving 2 cartons Osmolite via tube. Patient denies diarrhea after bolus. Wife says he tolerates formula well. He is constipated. Patient will start miralax per MD.    Medications: reviewed   Labs: no new labs  Anthropometrics: Last wt 167.8 lb (aria) on 2/17 decreased 3% in one week - severe  2/10 - 172.4 lb  2/3 - 170 lb 1/28 - 172.8 lb  1/13 - 171.2 lb    NUTRITION DIAGNOSIS: Predicted suboptimal intake ongoing    INTERVENTION:  Encourage high calorie high protein foods in soft moist textures Continue drinking 2 Ensure Complete - samples + coupons provided Continue 2 Osmolite 1.5 via tube - recommend increasing 3-4/day with wt loss and decreased oral intake - pt and wife are agreeable to this Congratulated pt on completing therapy    MONITORING, EVALUATION, GOAL: wt trends, intake   NEXT VISIT: Friday March 21 in clinic

## 2023-11-23 NOTE — Radiation Completion Notes (Signed)
 Patient Name: Garrett Fleming, Garrett Fleming MRN: [id number] Date of Birth: 1956/09/21 Referring Physician: Corey Skains, M.D. Date of Service: 2023-11-23 Radiation Oncologist: Lonie Peak, M.D. 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, M=cM0 Intent: Curative     ==========DELIVERED PLANS==========  First Treatment Date: 2023-10-05 Last Treatment Date: 2023-11-20   Plan Name: HN_Larynx Site: Larynx Technique: IMRT Mode: Photon Dose Per Fraction: 2 Gy Prescribed Dose (Delivered / Prescribed): 60 Gy / 60 Gy Prescribed Fxs (Delivered / Prescribed): 30 / 30     ==========ON TREATMENT VISIT DATES========== 2023-10-05, 2023-10-12, 2023-10-20, 2023-10-27, 2023-11-02, 2023-11-09, 2023-11-16     ==========UPCOMING VISITS==========       ==========APPENDIX - ON TREATMENT VISIT NOTES==========   See weekly On Treatment Notes in Epic for details in the Media tab (listed as Progress notes on the On Treatment Visit Dates listed above).

## 2023-11-23 NOTE — Progress Notes (Signed)
 Oncology Nurse Navigator Documentation   Mr. Siegel completed radiation on 2/21. He and his significant other have my direct number and know to call me with any questions or concerns. He will return for follow up with Dr. Basilio Cairo on 12/18/23.   Hedda Slade RN, BSN, OCN Head & Neck Oncology Nurse Navigator [facility] Phone # [phone]  Fax # [phone]

## 2023-12-03 ENCOUNTER — Telehealth: Payer: Self-pay | Admitting: Physical Therapy

## 2023-12-03 ENCOUNTER — Ambulatory Visit: Payer: Self-pay | Attending: Radiation Oncology | Admitting: Physical Therapy

## 2023-12-03 DIAGNOSIS — C329 Malignant neoplasm of larynx, unspecified: Secondary | ICD-10-CM | POA: Insufficient documentation

## 2023-12-03 DIAGNOSIS — R293 Abnormal posture: Secondary | ICD-10-CM | POA: Insufficient documentation

## 2023-12-03 NOTE — Telephone Encounter (Signed)
 Called Mr. Belfield and left a message to let him know of his missed appointment. Left number to reschedule. Milagros Loll [place], [place] 12/03/23 1:51 PM

## 2023-12-14 DIAGNOSIS — Z430 Encounter for attention to tracheostomy: Secondary | ICD-10-CM | POA: Diagnosis not present

## 2023-12-16 NOTE — Progress Notes (Signed)
 Mr. Garrett Fleming presents today in the clinic for a one month follow up. 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    Pain issues, if any: Patient says he has 7 out of 10 throat pain, throat feels sore and tender. Pain is brought by eating foods, some foods get stuck in his throat. Encouraged patient to take small bites and sips of food and take his time. Using a feeding tube?: Patient does have a feeding tube, uses formula twice a day for nutrition. Weight changes, if any: Patient has gained 4 lbs since treatment. Swallowing issues, if any: Patient's swallowing issues have improved. He has been going to swallowing therapy and is doing exercises. Smoking or chewing tobacco? Patient denies. Using fluoride toothpaste daily? Patient denies Last ENT visit was on: Appointment with ENT on 12/23/23 Other notable issues, if any:   None    BP (!) 165/96 (BP Location: Left Arm, Patient Position: Sitting)   Pulse (!) 50   Temp 98.2 F (36.8 C) (Oral)   Resp 19   Ht 6\' 4"  (1.93 m)   Wt 171 lb 3.2 oz (77.7 kg)   SpO2 98%   BMI 20.84 kg/m     Wt Readings from Last 3 Encounters:  12/18/23 171 lb 3.2 oz (77.7 kg)  11/10/23 168 lb 12.8 oz (76.6 kg)  10/15/23 173 lb (78.5 kg)

## 2023-12-17 ENCOUNTER — Inpatient Hospital Stay: Attending: Oncology

## 2023-12-17 ENCOUNTER — Ambulatory Visit: Attending: Radiation Oncology

## 2023-12-17 DIAGNOSIS — C329 Malignant neoplasm of larynx, unspecified: Secondary | ICD-10-CM | POA: Diagnosis not present

## 2023-12-17 DIAGNOSIS — R1310 Dysphagia, unspecified: Secondary | ICD-10-CM | POA: Diagnosis not present

## 2023-12-17 DIAGNOSIS — R490 Dysphonia: Secondary | ICD-10-CM | POA: Insufficient documentation

## 2023-12-17 NOTE — Patient Instructions (Signed)
 SWALLOWING EXERCISES Do these 5-6 days/week until September 20, then 2 days per week afterwards You can use 1-2 drops of liquid to help you swallow, if your mouth gets dry  Effortful Swallows - Press your tongue against the roof of your mouth for 3 seconds, then swallow as hard as you can - Do at least 20 reps/day, in sets of 5-10  Masako Swallow - swallow with your tongue sticking out - Stick tongue out past your lips and gently bite tongue with your teeth - Swallow, while holding your tongue with your teeth - Do at least 20 reps/day, in sets of 5-10   3   Chin tuck - Place a rolled up towel (4 inches wide) under your chin near your neck - Tuck your chin and push hard on the towel for 5 seconds - Do at least 20 reps/day, in sets of 5-10  - Do at least 20 reps/day, in sets of 5-10
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
## 2023-12-18 ENCOUNTER — Ambulatory Visit
Admission: RE | Admit: 2023-12-18 | Discharge: 2023-12-18 | Disposition: A | Discharge status: Home / Self Care | Payer: Self-pay | Source: Ambulatory Visit | Attending: Radiation Oncology | Admitting: Radiation Oncology

## 2023-12-18 ENCOUNTER — Inpatient Hospital Stay

## 2023-12-18 ENCOUNTER — Encounter: Payer: Self-pay | Admitting: Radiation Oncology

## 2023-12-18 ENCOUNTER — Inpatient Hospital Stay: Payer: Medicare HMO | Admitting: Dietician

## 2023-12-18 VITALS — BP 165/96 | HR 50 | Temp 98.2°F | Resp 19 | Ht 76.0 in | Wt 171.2 lb

## 2023-12-18 DIAGNOSIS — C329 Malignant neoplasm of larynx, unspecified: Secondary | ICD-10-CM

## 2023-12-18 DIAGNOSIS — C320 Malignant neoplasm of glottis: Secondary | ICD-10-CM

## 2023-12-18 MED ORDER — LIDOCAINE VISCOUS HCL 2 % MT SOLN: OROMUCOSAL | 3 refills | Status: DC

## 2023-12-18 NOTE — Progress Notes (Signed)
 Nutrition Follow-up:  Patient with Larynex cancer. S/p bilateral neck dissection with flap/graft. Trach in place. He is receiving radiation therapy under the care of Dr. Basilio Cairo. Final RT 2/21   DME: [employer]  Met with pt and wife prior to MD visit. Pt is in good spirits today. Reports improvement everyday. He is eating and drinking well. Reports 8/10 pain with swallowing regular textures. He is taking small bites. He is drinking 2 Ensure Complete. Pt really likes these. He is supplementing po with 2 cartons Osmolite 1.5 via tube. Denies nausea, vomiting, diarrhea, constipation.   Medications: reviewed   Labs: no new labs  Anthropometrics: Wt 171 lb 3.2 oz today increased   2/17 - 167.8 lb   NUTRITION DIAGNOSIS: Predicted suboptimal intake improving    INTERVENTION:  Continue trying new foods, chewing regular textures well Continue 2 Ensure Complete - samples + coupons  Continue 2 Osmolite 1.5 via tube     MONITORING, EVALUATION, GOAL: wt trends, intake, TF   NEXT VISIT: Friday April 11 via telephone

## 2023-12-18 NOTE — Progress Notes (Signed)
 Radiation Oncology         [phone] ________________________________  Name: Garrett Fleming MRN: [id number]  Date: 12/18/2023  DOB: 01/24/1956  Follow-Up Visit Note  CC: Quintella Reichert, MD  Corey Skains, MD  Diagnosis and Prior Radiotherapy:       ICD-10-CM   1. Malignant neoplasm of glottis (HCC)  C32.0     2. Squamous cell carcinoma of larynx (HCC)  C32.9 lidocaine (XYLOCAINE) 2 % solution      Cancer Staging  Squamous cell carcinoma of larynx (HCC) Staging form: Larynx - Glottis, AJCC 8th Edition - Pathologic stage from 09/04/2023: Stage IVA (pT4a, pN0, cM0) - Signed by Lonie Peak, MD on 09/04/2023 Stage prefix: Initial diagnosis  Plan Name: HN_Larynx Site: Larynx Technique: IMRT Mode: Photon Dose Per Fraction: 2 Gy Prescribed Dose (Delivered / Prescribed): 60 Gy / 60 Gy Prescribed Fxs (Delivered / Prescribed): 30 / 30   CHIEF COMPLAINT:  Here for follow-up and surveillance of laryngeal cancer  Narrative:  The patient returns today for routine follow-up.  The patient, with a history of tracheostomy, presents for a refill of lidocaine for persistent throat soreness. The soreness is described as 'somewhat sore' and tends to worsen when the muscles are allowed to relax. The patient denies any other lingering symptoms and reports a significant improvement in phlegm production.  The patient's weight has been stable, with a recent increase from 168 to 171 pounds. The patient's skin is noted to be dry and hyperpigmented throughout the upper chest and neck. The patient has been using a cream for this, which he requests a refill for.  The patient also mentions an upcoming appointment for dentures.              Wt Readings from Last 3 Encounters:  12/18/23 171 lb 3.2 oz (77.7 kg)  11/10/23 168 lb 12.8 oz (76.6 kg)  10/15/23 173 lb (78.5 kg)          ALLERGIES:  is allergic to lisinopril.  Meds: Current Outpatient Medications  Medication Sig Dispense Refill    aspirin EC 81 MG tablet Take 1 tablet (81 mg total) by mouth daily. Swallow whole. 90 tablet 3   carvedilol (COREG) 3.125 MG tablet Take 1 tablet (3.125 mg total) by mouth 2 (two) times daily. 180 tablet 3   celecoxib (CELEBREX) 200 MG capsule Take 200 mg by mouth 2 (two) times daily. (Patient not taking: Reported on 11/10/2023)     famotidine (PEPCID) 40 MG/5ML suspension Take 40 mg by mouth daily. (Patient not taking: Reported on 11/10/2023)     guaiFENesin (ROBITUSSIN) 100 MG/5ML liquid Take 10 mLs by mouth every 6 (six) hours as needed for cough or to loosen phlegm. Ok to give per feeding tube. (Patient not taking: Reported on 11/10/2023) 473 mL 4   HYDROcodone-acetaminophen (HYCET) 7.5-325 mg/15 ml solution Take 10 mLs by mouth every 6 (six) hours as needed for moderate pain (pain score 4-6). 200 mL 0   lidocaine (XYLOCAINE) 2 % solution Patient: Mix 1part 2% viscous lidocaine, 1part H20. Swallow 10mL of diluted mixture, before meals and at bedtime, up to QID 200 mL 3   Nutritional Supplements (FEEDING SUPPLEMENT, OSMOLITE 1.5 CAL,) LIQD Place 1,000 mLs into feeding tube continuous. (Patient not taking: Reported on 11/10/2023)     pantoprazole (PROTONIX) 40 MG injection Inject 40 mg into the vein every 12 (twelve) hours. (Patient not taking: Reported on 11/10/2023)     Protein (FEEDING SUPPLEMENT, PROSOURCE TF20,) liquid  Place 60 mLs into feeding tube daily. (Patient not taking: Reported on 11/10/2023)     scopolamine (TRANSDERM-SCOP) 1 MG/3DAYS Place 1 patch (1.5 mg total) onto the skin every 3 (three) days. (Patient not taking: Reported on 11/10/2023)     sucralfate (CARAFATE) 1 GM/10ML suspension Place 1 g into feeding tube 4 (four) times daily.     No current facility-administered medications for this encounter.    Physical Findings: The patient is in no acute distress. Patient is alert and oriented. Wt Readings from Last 3 Encounters:  12/18/23 171 lb 3.2 oz (77.7 kg)  11/10/23 168 lb  12.8 oz (76.6 kg)  10/15/23 173 lb (78.5 kg)    height is 6\' 4"  (1.93 m) and weight is 171 lb 3.2 oz (77.7 kg). His oral temperature is 98.2 F (36.8 C). His blood pressure is 165/96 (abnormal) and his pulse is 50 (abnormal). His respiration is 19 and oxygen saturation is 98%. .  General: Alert and oriented, in no acute distress  HEENT: Upper throat and mouth clear. NECK: Tracheostomy site clean and intact. Hyperpigmented skin on upper chest and neck. Skin dry. Postoperative changes in neck. No obvious masses in neck. Skin: Skin in treatment fields shows residual dryness and pigmentation Lymphatics: see Neck Exam Psychiatric: Judgment and insight are intact. Affect is appropriate.   Lab Findings: Lab Results  Component Value Date   WBC 7.0 09/04/2023   HGB 11.6 (L) 09/04/2023   HCT 36.4 (L) 09/04/2023   MCV 95.0 09/04/2023   PLT 392 09/04/2023    Lab Results  Component Value Date   TSH 0.687 09/04/2023    Radiographic Findings: No results found.  Impression/Plan:    1) Head and Neck Cancer Status: healing well from post op RT  2) Nutritional Status: stable PEG tube:  Patient does have a feeding tube, uses formula twice a day for nutrition.   3) Risk Factors: The patient has been educated about risk factors including alcohol and tobacco abuse; they understand that avoidance of alcohol and tobacco is important to prevent recurrences as well as other cancers  4) Swallowing: improved, continue SLP  5) Dental: getting dentures  6) Thyroid function: check annually Lab Results  Component Value Date   TSH 0.687 09/04/2023    7) Other: Postoperative changes in the neck Postoperative changes with no masses. Healing progressing well. - Order CT scan of neck and chest in 2.5 months. - Schedule follow-up after CT scan.  Sore throat Sore throat improving - Refill lidocaine prescription, sent to [facility]    Dry skin with hyperpigmentation Dry skin with hyperpigmentation  on upper chest and neck. Healing but remains dry. - Refill Sonifine cream. - Apply  cream twice daily to upper chest, upper back, and neck.   On date of service, in total, I spent 30 minutes on this encounter. Patient was seen in person. _____________________________________   Lonie Peak, MD

## 2023-12-21 ENCOUNTER — Other Ambulatory Visit: Payer: Self-pay

## 2023-12-21 DIAGNOSIS — C329 Malignant neoplasm of larynx, unspecified: Secondary | ICD-10-CM

## 2023-12-29 DIAGNOSIS — C329 Malignant neoplasm of larynx, unspecified: Secondary | ICD-10-CM | POA: Diagnosis not present

## 2023-12-29 DIAGNOSIS — R490 Dysphonia: Secondary | ICD-10-CM | POA: Diagnosis not present

## 2024-01-07 ENCOUNTER — Other Ambulatory Visit: Payer: Self-pay | Admitting: *Deleted

## 2024-01-07 DIAGNOSIS — I771 Stricture of artery: Secondary | ICD-10-CM

## 2024-01-08 ENCOUNTER — Inpatient Hospital Stay: Attending: Oncology | Admitting: Dietician

## 2024-01-08 ENCOUNTER — Telehealth: Payer: Self-pay | Admitting: Dietician

## 2024-01-08 NOTE — Telephone Encounter (Signed)
 Nutrition Follow-up:  Patient with Larynex cancer. S/p bilateral neck dissection with flap/graft. Trach in place. He is receiving radiation therapy under the care of Dr. Basilio Cairo. Final RT 2/21   DME: Maryagnes Amos with wife via telephone for nutrition follow-up. Reports pt is present and listening on speaker. Pt has a great appetite. He has been including more solid textures. Patient having some dysphagia as well as regurgitation if he takes too big of a bite. Last episode ~1 hour ago while eating a sausage dog and fries. Noted recommendations for MBS per 4/1 WF SLP. Patient is supplementing with bolus feedings. Reports giving 2 cartons daily.     Medications: reviewed   Labs: No new labs   Anthropometrics: Last wt 171 lb 3.2 oz on 3/21 increased from 168 lb 12.8 oz on 2/11   NUTRITION DIAGNOSIS: Predicted suboptimal intake improving    INTERVENTION:  Encourage small bites and chewing foods thoroughly - suggested using blender to fine chop meats Continue 2 cartons Osmolite 1.5  MBS pending - pt to see SLP 4/22    MONITORING, EVALUATION, GOAL: wt trends, intake, TF   NEXT VISIT: To be scheduled

## 2024-01-13 DIAGNOSIS — Z430 Encounter for attention to tracheostomy: Secondary | ICD-10-CM | POA: Diagnosis not present

## 2024-01-18 ENCOUNTER — Ambulatory Visit: Payer: Medicare HMO | Admitting: Surgery

## 2024-01-18 ENCOUNTER — Encounter: Payer: Self-pay | Admitting: Surgery

## 2024-01-18 ENCOUNTER — Ambulatory Visit (HOSPITAL_COMMUNITY)
Admission: RE | Admit: 2024-01-18 | Discharge: 2024-01-18 | Disposition: A | Discharge status: Home / Self Care | Payer: Medicare HMO | Source: Ambulatory Visit | Attending: Surgery | Admitting: Surgery

## 2024-01-18 VITALS — BP 170/98 | HR 47 | Temp 97.7°F | Resp 18 | Ht 76.0 in | Wt 169.0 lb

## 2024-01-18 DIAGNOSIS — I771 Stricture of artery: Secondary | ICD-10-CM | POA: Diagnosis not present

## 2024-01-18 DIAGNOSIS — I7123 Aneurysm of the descending thoracic aorta, without rupture: Secondary | ICD-10-CM
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
## 2024-01-19 ENCOUNTER — Ambulatory Visit: Attending: Radiation Oncology | Admitting: Physical Therapy

## 2024-01-19 ENCOUNTER — Telehealth: Payer: Self-pay

## 2024-01-19 ENCOUNTER — Ambulatory Visit

## 2024-01-19 ENCOUNTER — Telehealth: Payer: Self-pay | Admitting: Physical Therapy

## 2024-01-19 DIAGNOSIS — C329 Malignant neoplasm of larynx, unspecified: Secondary | ICD-10-CM | POA: Insufficient documentation

## 2024-01-19 DIAGNOSIS — R293 Abnormal posture: Secondary | ICD-10-CM | POA: Insufficient documentation

## 2024-01-19 NOTE — Telephone Encounter (Signed)
 Called Mr. Sondgeroth and left a message to let him know of his missed appointment. Left number to reschedule. Evelyn Hire [place], [place]   01/19/24 12:35 PM

## 2024-01-29 ENCOUNTER — Ambulatory Visit: Attending: Radiation Oncology

## 2024-01-29 ENCOUNTER — Telehealth: Payer: Self-pay

## 2024-01-29 NOTE — Telephone Encounter (Signed)
  SLP attempted to call pt to inform himof no-show today. Could not leave voicemail due to mailbox full, so SLP left clinic number as a text message.

## 2024-02-10 DIAGNOSIS — Z430 Encounter for attention to tracheostomy: Secondary | ICD-10-CM | POA: Diagnosis not present

## 2024-02-11 ENCOUNTER — Telehealth: Payer: Self-pay | Admitting: *Deleted

## 2024-02-11 NOTE — Telephone Encounter (Signed)
 Called patient to inform of Ct for 02-24-24- arrival time- 4 pm @ [facility], no restrictions to scan, patient to receive results from Dr. Lurena Sally on 03-01-24 @ 3:20 pm, spoke with patient's sgo- Garrett Fleming and she is aware of these appts. and the instructions

## 2024-02-12 ENCOUNTER — Other Ambulatory Visit: Payer: Self-pay

## 2024-02-12 DIAGNOSIS — C320 Malignant neoplasm of glottis: Secondary | ICD-10-CM

## 2024-02-24 ENCOUNTER — Ambulatory Visit (HOSPITAL_COMMUNITY)
Admission: RE | Admit: 2024-02-24 | Discharge: 2024-02-24 | Disposition: A | Discharge status: Home / Self Care | Source: Ambulatory Visit | Attending: Radiation Oncology | Admitting: Radiation Oncology

## 2024-02-24 ENCOUNTER — Inpatient Hospital Stay: Attending: Oncology

## 2024-02-24 DIAGNOSIS — Z930 Tracheostomy status: Secondary | ICD-10-CM | POA: Diagnosis not present

## 2024-02-24 DIAGNOSIS — C329 Malignant neoplasm of larynx, unspecified: Secondary | ICD-10-CM | POA: Insufficient documentation

## 2024-02-24 DIAGNOSIS — R918 Other nonspecific abnormal finding of lung field: Secondary | ICD-10-CM | POA: Diagnosis not present

## 2024-02-24 DIAGNOSIS — I700 Atherosclerosis of aorta: Secondary | ICD-10-CM | POA: Diagnosis not present

## 2024-02-24 DIAGNOSIS — C760 Malignant neoplasm of head, face and neck: Secondary | ICD-10-CM | POA: Diagnosis not present

## 2024-02-24 DIAGNOSIS — R590 Localized enlarged lymph nodes: Secondary | ICD-10-CM | POA: Diagnosis not present

## 2024-02-24 DIAGNOSIS — C4442 Squamous cell carcinoma of skin of scalp and neck: Secondary | ICD-10-CM | POA: Diagnosis not present

## 2024-02-24 MED ORDER — IOHEXOL 300 MG/ML  SOLN
75.0000 mL | Freq: Once | INTRAMUSCULAR | Status: AC | PRN
  Administered 2024-02-24: 75 mL via INTRAVENOUS

## 2024-03-01 ENCOUNTER — Ambulatory Visit
Admission: RE | Admit: 2024-03-01 | Discharge: 2024-03-01 | Disposition: A | Discharge status: Home / Self Care | Source: Ambulatory Visit | Attending: Radiation Oncology | Admitting: Radiation Oncology

## 2024-03-01 ENCOUNTER — Other Ambulatory Visit: Payer: Self-pay

## 2024-03-01 VITALS — BP 185/111 | HR 50 | Temp 97.8°F | Resp 20 | Ht 76.0 in | Wt 181.2 lb

## 2024-03-01 DIAGNOSIS — Z79899 Other long term (current) drug therapy: Secondary | ICD-10-CM | POA: Insufficient documentation

## 2024-03-01 DIAGNOSIS — C329 Malignant neoplasm of larynx, unspecified: Secondary | ICD-10-CM

## 2024-03-01 DIAGNOSIS — C320 Malignant neoplasm of glottis: Secondary | ICD-10-CM | POA: Diagnosis not present

## 2024-03-01 NOTE — Progress Notes (Signed)
 Radiation Oncology         [phone] ________________________________  Name: RHYATT MUSKA MRN: [id number]  Date: 03/01/2024  DOB: 06-Mar-1956  Follow-Up Visit Note  CC: Jacqueline Matsu, MD  Jacqueline Matsu, MD  Diagnosis and Prior Radiotherapy:       ICD-10-CM   1. 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    CT of the neck on 02/24/2024 demonstrated postsurgical changes from the total laryngectomy with flap reconstruction and no evidence of residual recurrent disease at the surgical site.  A mildly prominent level 1A cervical node measuring 0.9 cm in greatest dimension, may be reactive in the setting of posttreatment inflammatory changes.  Retropharyngeal edema was also noted, likely reflecting posttreatment changes.  Additional mild stranding in the submental and submandibular regions, were also noted to likely be reflecting additional posttreatment changes.  Pain issues, if any: Patient denies any pain but having some throat soreness on the right site. Using a feeding tube?: Patient does have feeding tube and uses it occasionally. Eats mostly by mouth, appetite is good. Weight changes, if any:  Patient has gained 11 pounds, has been eating well. Swallowing issues, if any: Patient denies any issues with swallowing food. Smoking or chewing tobacco? None Using fluoride toothpaste daily? Denies, patient had teeth pulled. Last ENT visit was on: January 2025 Other notable issues, if any: None  ALLERGIES:  is allergic to lisinopril .  Meds: Current Outpatient Medications  Medication Sig Dispense Refill   aspirin  EC 81 MG tablet Take 1 tablet (81 mg total) by mouth daily. Swallow whole. 90 tablet 3   carvedilol  (COREG ) 3.125 MG tablet Take 1 tablet (3.125 mg total) by mouth 2 (two) times daily. 180 tablet 3   celecoxib (CELEBREX) 200 MG capsule Take 200 mg by mouth 2 (two) times daily. (Patient not taking: Reported on 11/10/2023)     famotidine  (PEPCID ) 40 MG/5ML suspension Take 40 mg by mouth daily. (Patient not taking: Reported on 11/10/2023)     guaiFENesin  (ROBITUSSIN) 100 MG/5ML liquid Take 10 mLs by mouth every 6 (six) hours as needed for cough or to loosen phlegm. Ok to give per feeding tube. (Patient not taking: Reported on 11/10/2023) 473 mL 4   HYDROcodone -acetaminophen  (HYCET) 7.5-325 mg/15 ml solution Take 10 mLs by mouth every 6 (six) hours as needed for moderate pain (pain score 4-6). 200 mL 0   lidocaine  (XYLOCAINE ) 2 % solution Patient: Mix 1part 2% viscous lidocaine , 1part H20. Swallow 10mL of diluted mixture, before meals and at bedtime, up to QID 200 mL 3   Nutritional Supplements (FEEDING SUPPLEMENT, OSMOLITE 1.5 CAL,) LIQD Place 1,000 mLs into feeding tube continuous. (Patient not taking: Reported on 11/10/2023)  pantoprazole  (PROTONIX ) 40 MG injection Inject 40 mg into the vein every 12 (twelve) hours. (Patient not taking: Reported on 11/10/2023)     Protein (FEEDING SUPPLEMENT, PROSOURCE TF20,) liquid Place 60 mLs into feeding tube daily. (Patient not taking: Reported on 11/10/2023)     scopolamine  (TRANSDERM-SCOP) 1 MG/3DAYS Place 1 patch (1.5 mg total) onto the skin  every 3 (three) days. (Patient not taking: Reported on 11/10/2023)     sucralfate (CARAFATE) 1 GM/10ML suspension Place 1 g into feeding tube 4 (four) times daily.     No current facility-administered medications for this encounter.    Physical Findings: *** The patient is in no acute distress. Patient is alert and oriented. Wt Readings from Last 3 Encounters:  03/01/24 181 lb 3.2 oz (82.2 kg)  01/18/24 169 lb (76.7 kg)  12/18/23 171 lb 3.2 oz (77.7 kg)    height is 6\' 4"  (1.93 m) and weight is 181 lb 3.2 oz (82.2 kg). His temperature is 97.8 F (36.6 C). His blood pressure is 185/111 (abnormal) and his pulse is 50 (abnormal). His respiration is 20 and oxygen saturation is 99%. .  General: Alert and oriented, in no acute distress  HEENT: Upper throat and mouth clear. NECK: Tracheostomy site clean and intact. Hyperpigmented skin on upper chest and neck. Skin dry. Postoperative changes in neck. No obvious masses in neck. Skin: Skin in treatment fields shows residual dryness and pigmentation Lymphatics: see Neck Exam Psychiatric: Judgment and insight are intact. Affect is appropriate.   Lab Findings: Lab Results  Component Value Date   WBC 7.0 09/04/2023   HGB 11.6 (L) 09/04/2023   HCT 36.4 (L) 09/04/2023   MCV 95.0 09/04/2023   PLT 392 09/04/2023    Lab Results  Component Value Date   TSH 0.687 09/04/2023    Radiographic Findings: CT Soft Tissue Neck W Contrast Result Date: 02/26/2024 CLINICAL DATA:  Head and neck cancer, follow-up. Squamous cell carcinoma of the larynx. EXAM: CT NECK WITH CONTRAST TECHNIQUE: Multidetector CT imaging of the neck was performed using the standard protocol following the bolus administration of intravenous contrast. RADIATION DOSE REDUCTION: This exam was performed according to the departmental dose-optimization program which includes automated exposure control, adjustment of the mA and/or kV according to patient size and/or use of iterative  reconstruction technique. CONTRAST:  75mL OMNIPAQUE  IOHEXOL  300 MG/ML  SOLN COMPARISON:  CT neck 07/07/2023, 06/25/2023. FINDINGS: Pharynx and larynx: Postsurgical changes of total laryngectomy with flap reconstruction. Resection of the previously noted obstructing mass without evidence of residual masslike soft tissue within the aero digestive structures in the neck. Tracheostomy tube in place which terminates within the trachea at the thoracic inlet. Postsurgical changes of bilateral neck dissection. Stranding within the subcutaneous tissues in the submental and submandibular regions likely reflecting post treatment changes. The nasopharynx is symmetric. The palate is unremarkable. Palatine tonsils are symmetric. The visualized oral cavity and floor of mouth is unremarkable. Normal appearance of the base of tongue. There is mild retropharyngeal edema which may reflect post treatment changes. Calcification of the stylohyoid ligaments noted. Salivary glands: The parotid glands are symmetric. Symmetric appearance of the submandibular glands. Mild edema within the submandibular spaces likely reflecting post treatment changes. No abnormal calcification. Thyroid : Partial resection of the thyroid . There is a 0.9 cm nodule within the superior right thyroid  lobe. Lymph nodes: No enlarged cervical lymph nodes by imaging size criteria. There is a mildly prominent 1A cervical node measuring up to 0.9 cm in short axis which  may be reactive in the setting of post treatment inflammatory changes. Vascular: Stent noted within the thoracic aorta. Atherosclerosis at the carotid bifurcations without evidence of high-grade stenosis. Limited intracranial: Negative. Visualized orbits: Right phthisis bulbi. Unremarkable appearance of the left orbit. Mastoids and visualized paranasal sinuses: No significant mucosal thickening in the visualized paranasal sinuses. No air-fluid levels. Mastoid air cells are clear. Skeleton: No acute or  aggressive findings. Degenerative changes in the visualized spine. Postsurgical changes of the mandible. Upper chest: Visualized lung apices without acute abnormality. Other: None. IMPRESSION: Postsurgical changes of total laryngectomy with flap reconstruction. No evidence of residual or recurrent mass at the surgical site. Mildly prominent level 1A cervical node measuring 0.9 cm in short axis may be reactive in the setting of post treatment inflammatory changes. Recommend attention on follow-up. Retropharyngeal edema likely reflecting post treatment changes. Additional mild stranding in the submental and submandibular regions likely reflecting additional post treatment changes. Tracheostomy in place with tube tip within the trachea at the thoracic inlet. Electronically Signed   By: Denny Flack M.D.   On: 02/26/2024 11:34   CT Chest W Contrast Result Date: 02/25/2024 CLINICAL DATA:  Head neck cancer monitoring. Squamous cell carcinoma of the larynx. * Tracking Code: BO * EXAM: CT CHEST WITH CONTRAST TECHNIQUE: Multidetector CT imaging of the chest was performed during intravenous contrast administration. RADIATION DOSE REDUCTION: This exam was performed according to the departmental dose-optimization program which includes automated exposure control, adjustment of the mA and/or kV according to patient size and/or use of iterative reconstruction technique. CONTRAST:  75mL OMNIPAQUE  IOHEXOL  300 MG/ML  SOLN COMPARISON:  Noncontrast CT scan 06/25/2023 and older FINDINGS: Cardiovascular: Heart is borderline size. No pericardial effusion. Coronary calcifications are seen. 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 There is a short segment dissection flap caudal to this along the descending thoracic aorta. The study of August 2024 was postcontrast. 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 Mediastinum/Nodes: Normal caliber thoracic esophagus. Small nodular focus along the right thyroid . Please see separate taken of neck CT scan. There is tracheostomy in place. No specific abnormal lymph node enlargement identified in the axillary regions, hilum or mediastinum. Lungs/Pleura: Mild linear opacity lung bases likely scar or atelectasis. No consolidation, pneumothorax or effusion. There is an ill-defined slightly nodular focus along the anterior left upper lobe abutting the pleural measuring 5 mm on image 34 series 4. This was not seen previously. Upper Abdomen: Adrenal glands are preserved in the upper abdomen. Of note there is streak artifact as the arms were scanned at the patient's side. Musculoskeletal: Slight curvature of the spine with some degenerative changes. Osteopenia. There is a mixed lucent sclerotic focus involving the lateral aspect of the left sixth rib on image 118. Unchanged from previous. IMPRESSION: Please see separate dictation of neck CT scan. Stable appearance of the aortic endograft with a small dissection flap seen extending caudal to the end of the graft but appearance is similar to a previous contrast CT of August 2024. 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 No developing pleural effusion, nodal enlargement in the thorax. New small semi-solid 5 mm left upper lobe juxtapleural lung nodule. Attention on follow-up for the patient's neoplasm. This recommendation follows the consensus statement: Guidelines for Management  of Incidental Pulmonary Nodules Detected on CT Images: From the Fleischner Society 2017; Radiology 2017; [phone]. Aortic Atherosclerosis (ICD10-I70.0). Electronically Signed   By: Adrianna Horde M.D.   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 ***  Risk Factors: The patient has been educated about risk factors including alcohol and tobacco abuse; they understand that avoidance of alcohol and tobacco is important to prevent recurrences as well as other cancers ***  Swallowing: improved, continue SLP ***  Dental: getting dentures ***  Thyroid  function: check annually - labwork TSH pending today. Lab Results  Component Value Date   TSH 0.687 09/04/2023    Followup: Sees ENT later this month.  Will arrange f/u in rad onc with CT neck/chest in 88mo for cont'd surveillance.      On date of service, in total, I spent 30 minutes on this encounter. Patient was seen in person. _____________________________________   Colie Dawes, MD ***

## 2024-03-01 NOTE — Progress Notes (Signed)
 Patient presents to the clinic today for a follow up. He completed radiation therapy for malignant neoplasm of the larynx on 11/20/2023.  02/24/24 CT Soft Tissue Neck W Contrast    Pain issues, if any: Patient denies any pain but having some throat soreness on the right site. Using a feeding tube?: Patient does have feeding tube and uses it occasionally. Eats mostly by mouth, appetite is good. Weight changes, if any: Patient has gained 11 pounds, has been eating well. Swallowing issues, if any: Patient denies any issues with swallowing food. Smoking or chewing tobacco? None Using fluoride toothpaste daily? Denies, patient had teeth pulled. Last ENT visit was on: January 2025 Other notable issues, if any: None BP (!) 185/111 (BP Location: Right Arm, Patient Position: Sitting, Cuff Size: Large)   Pulse (!) 50   Temp 97.8 F (36.6 C)   Resp 20   Ht 6\' 4"  (1.93 m)   Wt 181 lb 3.2 oz (82.2 kg)   SpO2 99%   BMI 22.06 kg/m    Wt Readings from Last 3 Encounters:  03/01/24 181 lb 3.2 oz (82.2 kg)  01/18/24 169 lb (76.7 kg)  12/18/23 171 lb 3.2 oz (77.7 kg)

## 2024-03-02 LAB — TSH: TSH: 1.72 u[IU]/mL (ref 0.350–4.500)

## 2024-03-04 ENCOUNTER — Other Ambulatory Visit: Payer: Self-pay

## 2024-03-04 DIAGNOSIS — C329 Malignant neoplasm of larynx, unspecified: Secondary | ICD-10-CM

## 2024-03-09 DIAGNOSIS — R490 Dysphonia: Secondary | ICD-10-CM | POA: Diagnosis not present

## 2024-03-09 DIAGNOSIS — C329 Malignant neoplasm of larynx, unspecified: Secondary | ICD-10-CM | POA: Diagnosis not present

## 2024-03-11 DIAGNOSIS — Z430 Encounter for attention to tracheostomy: Secondary | ICD-10-CM | POA: Diagnosis not present

## 2024-04-07 ENCOUNTER — Telehealth: Payer: Self-pay | Admitting: Dietician

## 2024-04-07 NOTE — Telephone Encounter (Signed)
 Attempted to contact patient/significant other for nutrition follow-up. No answer. Unable to leave VM.   Chart reviewed. Patient seen by [facility] 6/11. Per notes, pt tolerating a variety of foods orally. Dysphagia with pork/chicken. He avoids this. Patient occasionally supplementing with 1-2 cartons of Osmolite. Some days he does not use tube. Patient is awaiting MBS to be completed at [facility].   Last wt 181 lb 3.2 oz on 6/3 at radiation follow-up with Dr. Izell. This is an increase from 169 lb on 4/21.   Will continue attempts to connect with patient as able.

## 2024-04-12 DIAGNOSIS — Z430 Encounter for attention to tracheostomy: Secondary | ICD-10-CM | POA: Diagnosis not present

## 2024-05-12 DIAGNOSIS — Z430 Encounter for attention to tracheostomy: Secondary | ICD-10-CM | POA: Diagnosis not present

## 2024-05-17 ENCOUNTER — Encounter (HOSPITAL_COMMUNITY): Payer: Self-pay

## 2024-05-17 ENCOUNTER — Emergency Department (HOSPITAL_COMMUNITY)
Admission: EM | Admit: 2024-05-17 | Discharge: 2024-05-17 | Disposition: A | Discharge status: Home / Self Care | Attending: Emergency Medicine | Admitting: Emergency Medicine

## 2024-05-17 ENCOUNTER — Other Ambulatory Visit: Payer: Self-pay

## 2024-05-17 DIAGNOSIS — Z430 Encounter for attention to tracheostomy: Secondary | ICD-10-CM

## 2024-05-17 DIAGNOSIS — J9509 Other tracheostomy complication: Secondary | ICD-10-CM | POA: Insufficient documentation

## 2024-05-17 DIAGNOSIS — Z7982 Long term (current) use of aspirin: Secondary | ICD-10-CM | POA: Diagnosis not present

## 2024-05-17 DIAGNOSIS — Z79899 Other long term (current) drug therapy: Secondary | ICD-10-CM | POA: Insufficient documentation

## 2024-05-17 NOTE — ED Triage Notes (Signed)
 Patient needs his trach suctioned, usually able to be done at home but supplies are on the way from DME and not delivered yet. Patient also needs feeding tube removed, family reports it is out already and needs to be dressed as he is eating on his own now. No pain, no signs of distress.

## 2024-05-17 NOTE — ED Notes (Signed)
 Patient verbalizes understanding of discharge instructions. Opportunity for questioning and answers were provided. Pt discharged from ED.

## 2024-05-17 NOTE — ED Provider Notes (Signed)
 [place] EMERGENCY DEPARTMENT AT [facility] Provider Note   CSN: [id number] Arrival date & time: 05/17/24  1148     Patient presents with: Jamal Problem and Feeding Tube Removal   Garrett Fleming is a 68 y.o. male.   HPI  Patient is a 68 year old male present emergency room today with complaints of needing trach suctioning.  Seems that he has had some issues with obtaining supplies from the usual DME distribution system.  Denies any dyspnea but does state that he feels that it is clogged.  Patient had feeding tube because of difficulty swallowing however this issue has resolved and his feeding tube is becoming dislodged.  It is no longer in place he request that this not be put back in position.  His wife at bedside confirms that patient has maintaining p.o. diet without difficulty.     Prior to Admission medications   Medication Sig Start Date End Date Taking? Authorizing Provider  aspirin  EC 81 MG tablet Take 1 tablet (81 mg total) by mouth daily. Swallow whole. 11/10/23   Shlomo Wilbert SAUNDERS, MD  carvedilol  (COREG ) 3.125 MG tablet Take 1 tablet (3.125 mg total) by mouth 2 (two) times daily. 11/10/23   Shlomo Wilbert SAUNDERS, MD  celecoxib (CELEBREX) 200 MG capsule Take 200 mg by mouth 2 (two) times daily. Patient not taking: Reported on 11/10/2023    [provider]  famotidine  (PEPCID ) 40 MG/5ML suspension Take 40 mg by mouth daily. Patient not taking: Reported on 11/10/2023    [provider]  guaiFENesin  (ROBITUSSIN) 100 MG/5ML liquid Take 10 mLs by mouth every 6 (six) hours as needed for cough or to loosen phlegm. Ok to give per feeding tube. Patient not taking: Reported on 11/10/2023 10/16/23   Izell Domino, MD  HYDROcodone -acetaminophen  (HYCET) 7.5-325 mg/15 ml solution Take 10 mLs by mouth every 6 (six) hours as needed for moderate pain (pain score 4-6). 11/16/23   Izell Domino, MD  lidocaine  (XYLOCAINE ) 2 % solution Patient: Mix 1part 2% viscous  lidocaine , 1part H20. Swallow 10mL of diluted mixture, before meals and at bedtime, up to QID 12/18/23   Izell Domino, MD  Nutritional Supplements (FEEDING SUPPLEMENT, OSMOLITE 1.5 CAL,) LIQD Place 1,000 mLs into feeding tube continuous. Patient not taking: Reported on 11/10/2023 07/05/23   Singh, Prashant K, MD  pantoprazole  (PROTONIX ) 40 MG injection Inject 40 mg into the vein every 12 (twelve) hours. Patient not taking: Reported on 11/10/2023 07/05/23   Singh, Prashant K, MD  Protein (FEEDING SUPPLEMENT, PROSOURCE TF20,) liquid Place 60 mLs into feeding tube daily. Patient not taking: Reported on 11/10/2023 07/06/23   Singh, Prashant K, MD  scopolamine  (TRANSDERM-SCOP) 1 MG/3DAYS Place 1 patch (1.5 mg total) onto the skin every 3 (three) days. Patient not taking: Reported on 11/10/2023 07/08/23   Singh, Prashant K, MD  sucralfate (CARAFATE) 1 GM/10ML suspension Place 1 g into feeding tube 4 (four) times daily. 08/20/23 09/19/23  [provider]  lisinopril  (ZESTRIL ) 10 MG tablet Take 1 tablet (10 mg total) by mouth daily. 10/19/19 02/13/20  Shlomo Wilbert SAUNDERS, MD    Allergies: Lisinopril     Review of Systems  Updated Vital Signs BP (!) 162/99 (BP Location: Right Arm)   Pulse 62   Temp 98.5 F (36.9 C)   Resp 20   Ht 6' 4 (1.93 m)   Wt 77.1 kg   SpO2 96%   BMI 20.69 kg/m   Physical Exam Vitals and nursing note reviewed.  Constitutional:  General: He is not in acute distress. HENT:     Head: Normocephalic and atraumatic.     Nose: Nose normal.     Mouth/Throat:     Mouth: Mucous membranes are moist.  Eyes:     General: No scleral icterus. Neck:     Comments: Tracheostomy in place no surrounding erythema Cardiovascular:     Rate and Rhythm: Normal rate and regular rhythm.     Pulses: Normal pulses.     Heart sounds: Normal heart sounds.  Pulmonary:     Effort: Pulmonary effort is normal. No respiratory distress.     Breath sounds: No wheezing.  Abdominal:      Palpations: Abdomen is soft.     Tenderness: There is no abdominal tenderness. There is no guarding or rebound.     Comments: Patient with feeding tube sitting external to abdominal wall.  It is taped to his abdomen.  Feeding tube site is clean without surrounding erythema.  No rashes or bleeding.  Musculoskeletal:     Cervical back: Normal range of motion.     Right lower leg: No edema.     Left lower leg: No edema.  Skin:    General: Skin is warm and dry.     Capillary Refill: Capillary refill takes less than 2 seconds.  Neurological:     Mental Status: He is alert. Mental status is at baseline.  Psychiatric:        Mood and Affect: Mood normal.        Behavior: Behavior normal.     (all labs ordered are listed, but only abnormal results are displayed) Labs Reviewed - No data to display  EKG: None  Radiology: No results found.   Procedures   Medications Ordered in the ED - No data to display                                  Medical Decision Making  Patient is a 68 year old male present emergency room today with complaints of needing trach suctioning.  Seems that he has had some issues with obtaining supplies from the usual DME distribution system.  Denies any dyspnea but does state that he feels that it is clogged.  Patient had feeding tube because of difficulty swallowing however this issue has resolved and his feeding tube is becoming dislodged.  It is no longer in place he request that this not be put back in position.  His wife at bedside confirms that patient has maintaining p.o. diet without difficulty.  Patient with normal-appearing tracheostomy site.  Feeding tube is sitting external to abdomen.  It is taped to his abdomen however no longer in place.  Patient's wife explains that there has been a plan to remove the feeding tube for some time because patient is able to eat p.o. however it seems that today percutaneous feeding tube became dislodged.  They wish to  have this left out.  I instructed nursing staff to clean and dress the G-tube site.  Will follow-up outpatient with primary care and monitor wound for signs of infection.  Final diagnoses:  Tracheostomy care [facility])    ED Discharge Orders     None          Neldon Hamp RAMAN, [place] 05/18/24 [id number]    Elnor Jayson LABOR, DO 05/23/24 [id number]

## 2024-06-14 ENCOUNTER — Telehealth: Payer: Self-pay | Admitting: *Deleted

## 2024-06-14 NOTE — Telephone Encounter (Signed)
 CALLED PATIENT TO INFORM OF CT FOR 06-28-24- ARRIVAL TIME- 2:45 PM @ [facility], NO RESTRICTIONS TO SCAN, PATIENT TO RECEIVE RESULTS FROM PA ELLIE MUIR ON 07-05-24 @ 11:40 AM, SPOKE WITH PATIENT'S CAREGIVER GERALDINE BENNETT AND SHE IS AWARE OF THESE APPTS. AND THE INSTRUCTIONS

## 2024-06-28 ENCOUNTER — Ambulatory Visit (HOSPITAL_COMMUNITY)
Admission: RE | Admit: 2024-06-28 | Discharge: 2024-06-28 | Disposition: A | Discharge status: Home / Self Care | Source: Ambulatory Visit | Attending: Radiation Oncology | Admitting: Radiation Oncology

## 2024-06-28 ENCOUNTER — Encounter

## 2024-06-28 DIAGNOSIS — C329 Malignant neoplasm of larynx, unspecified: Secondary | ICD-10-CM | POA: Diagnosis present

## 2024-06-28 MED ORDER — SODIUM CHLORIDE (PF) 0.9 % IJ SOLN
INTRAMUSCULAR | Status: AC
  Filled 2024-06-28: qty 50

## 2024-06-28 MED ORDER — IOHEXOL 300 MG/ML  SOLN
75.0000 mL | Freq: Once | INTRAMUSCULAR | Status: AC | PRN
  Administered 2024-06-28: 75 mL via INTRAVENOUS

## 2024-07-01 NOTE — Progress Notes (Addendum)
 Radiation Oncology         [phone] ________________________________  Name: Garrett Fleming MRN: [id number]  Date: 07/05/2024  DOB: [birth year]  Follow-Up Visit Note  CC: Shlomo Wilbert SAUNDERS, MD  Shlomo Wilbert SAUNDERS, MD  Diagnosis and Prior Radiotherapy:       ICD-10-CM   1. 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  CT of the chest and neck on 06/28/2024 demonstrated postoperative changes from laryngectomy with no evidence of recurrent or metastatic disease.  Pain issues, if any: Patient denies any pain. Reports some soreness on the left side of his neck, where his collar sits.  Using a feeding tube?:  Taken out 08/25 Weight changes, if any:     Wt Readings from Last 4 Encounters: 07/05/24          189.0 lb  05/17/24 170 lb (77.1 kg)  03/01/24 181 lb 3.2 oz (82.2 kg)  01/18/24 169 lb (76.7 kg)  Swallowing issues, if any: Yes Smoking or chewing tobacco? Denies Using fluoride toothpaste daily? No Last ENT visit was on: 06/29/24 Dr. Fonda Muscat Other notable issues, if any: Patient was having trouble with getting another suctioning  machine.   ALLERGIES:  is allergic to lisinopril .  Meds: Current Outpatient Medications  Medication Sig Dispense Refill   aspirin  EC 81 MG tablet Take 1 tablet (81 mg total) by mouth daily. Swallow whole. 90 tablet 3   carvedilol  (COREG ) 6.25 MG tablet Take 1 tablet (6.25 mg total) by mouth 2 (two) times daily with a meal. 60 tablet 3   celecoxib (CELEBREX) 200 MG capsule Take 200 mg by mouth 2 (two) times daily. (Patient not taking: Reported on 11/10/2023)     famotidine  (PEPCID ) 40 MG/5ML suspension Take 40 mg by mouth daily. (Patient not taking: Reported on 11/10/2023)     guaiFENesin  (ROBITUSSIN) 100 MG/5ML liquid Take 10 mLs by mouth every 6 (six) hours as needed for cough or to loosen phlegm. Ok to give per feeding tube. (Patient not taking: Reported on 11/10/2023) 473 mL 4   HYDROcodone -acetaminophen  (HYCET) 7.5-325 mg/15 ml solution Take 10 mLs by mouth every 6 (six) hours as needed for moderate pain (pain score 4-6). (Patient not taking: Reported on 07/05/2024) 200 mL 0   lidocaine  (XYLOCAINE ) 2 % solution Patient: Mix 1part 2% viscous lidocaine , 1part H20. Swallow 10mL of diluted mixture, before meals and at bedtime, up to QID (Patient not taking: Reported on 07/05/2024) 200 mL 3   Nutritional Supplements (FEEDING SUPPLEMENT, OSMOLITE 1.5 CAL,) LIQD Place 1,000 mLs into feeding tube continuous. (Patient not taking: Reported on 11/10/2023)     pantoprazole  (PROTONIX ) 40 MG  injection Inject 40 mg into the vein every 12 (twelve) hours. (Patient not taking: Reported on 11/10/2023)     Protein (FEEDING SUPPLEMENT, PROSOURCE TF20,) liquid Place 60 mLs into feeding tube daily. (Patient not taking: Reported on 11/10/2023)     scopolamine  (TRANSDERM-SCOP) 1 MG/3DAYS Place 1 patch (1.5 mg total) onto the skin every 3 (three) days. (Patient not taking: Reported on 11/10/2023)     sucralfate (CARAFATE) 1 GM/10ML suspension Place 1 g into feeding tube 4 (four) times daily. (Patient not taking: Reported  on 07/05/2024)     No current facility-administered medications for this encounter.    Physical Findings:   Wt Readings from Last 3 Encounters:  07/05/24 189 lb (85.7 kg)  05/17/24 170 lb (77.1 kg)  03/01/24 181 lb 3.2 oz (82.2 kg)    height is 6' 4 (1.93 m) and weight is 189 lb (85.7 kg). His oral temperature is 98 F (36.7 C). His blood pressure is 200/105 (abnormal) and his pulse is 53 (abnormal). His respiration is 18 and oxygen saturation is 96%.   General: Alert and oriented, in no acute distress HEENT: Papillary bump on right side of the inside of the bottom lip, consistent from trauma from fall last week. No other concerning lesions. Edentulous.  NECK: Tracheostomy site clean and intact. Stoma shows no signs of infection. Skin dry. Postoperative changes in neck. No obvious masses in neck. No palpable adenopathy  Skin: Mild dryness within the treatment field.  CV: Heart RRR PULM: Lungs CTA Lymphatics: see Neck Exam Psychiatric: Judgment and insight are intact. Affect is appropriate.   Lab Findings: Lab Results  Component Value Date   WBC 7.0 09/04/2023   HGB 11.6 (L) 09/04/2023   HCT 36.4 (L) 09/04/2023   MCV 95.0 09/04/2023   PLT 392 09/04/2023    Lab Results  Component Value Date   TSH 1.720 03/01/2024    Radiographic Findings: CT Soft Tissue Neck W Contrast Result Date: 06/30/2024 CLINICAL DATA:  Monitor squamous cell carcinoma of the larynx EXAM: CT NECK WITH CONTRAST TECHNIQUE: Multidetector CT imaging of the neck was performed using the standard protocol following the bolus administration of intravenous contrast. RADIATION DOSE REDUCTION: This exam was performed according to the departmental dose-optimization program which includes automated exposure control, adjustment of the mA and/or kV according to patient size and/or use of iterative reconstruction technique. CONTRAST:  75mL OMNIPAQUE  IOHEXOL  300 MG/ML  SOLN COMPARISON:  Feb 24, 2024 FINDINGS: There has  been a laryngectomy. The nasopharynx appears normal.  The oropharynx appears normal. Salivary glands: The parotid and submandibular glands are normal Thyroid : There is a small right thyroid  nodule Lymph nodes: Mildly prominent submental lymph nodes are unchanged. No new adenopathy. Vascular: There is no carotid stenosis. Thoracic endograft is noted. Limited intracranial: No significant abnormality Visualized orbits: No significant abnormality Mastoids and visualized paranasal sinuses: No significant abnormality Skeleton: No significant abnormality Upper chest: Lung apices are clear. Other: None IMPRESSION: Postoperative changes from laryngectomy. No recurrent mass or adenopathy identified. Electronically Signed   By: Nancyann Burns M.D.   On: 06/30/2024 10:15   CT Chest W Contrast Result Date: 06/28/2024 CLINICAL DATA:  Head/neck cancer, monitor. * Tracking Code: BO * EXAM: CT CHEST WITH CONTRAST TECHNIQUE: Multidetector CT imaging of the chest was performed during intravenous contrast administration. RADIATION DOSE REDUCTION: This exam was performed according to the departmental dose-optimization program which includes automated exposure control, adjustment of the mA and/or kV according to patient size and/or use of iterative  reconstruction technique. CONTRAST:  75mL OMNIPAQUE  IOHEXOL  300 MG/ML  SOLN COMPARISON:  CT scan chest from 02/24/2024. FINDINGS: Cardiovascular: Mild cardiomegaly. No pericardial effusion. Redemonstration of aortic stent in the distal aortic arch and proximal descending thoracic aorta, essentially similar to the prior study. There is small dissection flap inferior to the graft in the descending thoracic aorta, also unchanged. There are coronary artery calcifications, in keeping with coronary artery disease. Mediastinum/Nodes: Visualized thyroid  gland appears grossly unremarkable. No solid / cystic mediastinal masses. The esophagus is nondistended precluding optimal assessment. No axillary,  mediastinal or hilar lymphadenopathy by size criteria. Lungs/Pleura: The central tracheo-bronchial tree is patent. There is mild, smooth, circumferential thickening of the segmental and subsegmental bronchial walls, throughout bilateral lungs, which is nonspecific. Findings are most commonly seen with bronchitis or reactive airway disease, such as asthma. There are dependent changes in bilateral lungs. No mass or consolidation. No pleural effusion or pneumothorax. Previously seen sub-5 mm nodule in the subpleural left upper lobe (series 6, image 38), is not well evaluated due to streak artifacts from contrast within the left subclavian vein however, appears grossly unchanged. No new or suspicious lung nodule. Upper Abdomen: Visualized upper abdominal viscera within normal limits. Musculoskeletal: The visualized soft tissues of the chest wall are grossly unremarkable. No suspicious osseous lesions. Please refer to separately dictated CT scan neck report for additional details. IMPRESSION: 1. No metastatic disease identified within the chest. 2. Multiple other nonacute observations, as described above. 3. Please refer to separately dictated CT scan neck report for additional details. Aortic Atherosclerosis (ICD10-I70.0). Electronically Signed   By: Ree Molt M.D.   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 We personally reviewed his imaging today. CT of the neck and chest demonstrated no evidence of residual or recurrent disease at the surgical site.  2) Nutritional Status: Patient is tolerating a wide-variety of foods. He has gained 11 lbs since we last saw him.  Wt Readings from Last 3 Encounters:  07/05/24 189 lb (85.7 kg)  05/17/24 170 lb (77.1 kg)  03/01/24 181  lb 3.2 oz (82.2 kg)  PEG tube: removed  3) Risk Factors: The patient has been educated about risk factors including alcohol and tobacco abuse; they understand that avoidance of alcohol and tobacco is important to prevent recurrences as well as other cancers. Patient denies any tobacco use.  4) Swallowing: Denies any issues with swallowing.   5) Dental: edentulous   6) Thyroid  function: check annually  Lab Results  Component Value Date   TSH 1.720 03/01/2024   7) Hypertension urgency: BP is elevated (186/104, 186/104, 181/102 mmhg). He reports taking his carvedilol  this AM. I have increased his dose and patient states he will go to the pharmacy to pick this up today. I will share this with his cardiologist, Dr. Shlomo. He does note a 2-day headache, which feels very similarly to headaches he has had in the past. These typically go away on their own. We reviewed reasons to go to the ER, if symptoms do not improve or worsen. He and his wife expressed understanding.   8) Patient having difficulty obtaining suctioning machine. Our nursing staff will assist with this.   9) Follow-up: Patient is scheduled to see his Dr. Glo in 6 months. We will  see the patient in 8 months with follow-up imaging to be completed prior.    On date of service, in total, I spent 30 minutes on this encounter. Patient was seen in person.  _____________________________________    Leeroy Due, PA-C

## 2024-07-04 NOTE — Progress Notes (Signed)
 Patient identity verified x2   Garrett Fleming presents today for a routine follow up and to review recent imaging to with Leeroy Due PA-C.    Pain issues, if any: Patient denies any pain but having some throat soreness on the right site  Using a feeding tube?: ***no signs of infection  Weight changes, if any: *** Wt Readings from Last 4 Encounters: 07/05/24  05/17/24 170 lb (77.1 kg)  03/01/24 181 lb 3.2 oz (82.2 kg)  01/18/24 169 lb (76.7 kg)   Swallowing issues, if any: *** Smoking or chewing tobacco? *** Using fluoride toothpaste daily? *** Last ENT visit was on: *** Other notable issues, if any: ***

## 2024-07-05 ENCOUNTER — Ambulatory Visit
Admission: RE | Admit: 2024-07-05 | Discharge: 2024-07-05 | Disposition: A | Discharge status: Home / Self Care | Source: Ambulatory Visit | Attending: Radiology | Admitting: Radiology

## 2024-07-05 ENCOUNTER — Encounter: Payer: Self-pay | Admitting: Radiology

## 2024-07-05 VITALS — BP 200/105 | HR 53 | Temp 98.0°F | Resp 18 | Ht 76.0 in | Wt 189.0 lb

## 2024-07-05 DIAGNOSIS — Z7982 Long term (current) use of aspirin: Secondary | ICD-10-CM | POA: Insufficient documentation

## 2024-07-05 DIAGNOSIS — Z791 Long term (current) use of non-steroidal anti-inflammatories (NSAID): Secondary | ICD-10-CM | POA: Insufficient documentation

## 2024-07-05 DIAGNOSIS — I71012 Dissection of descending thoracic aorta: Secondary | ICD-10-CM | POA: Diagnosis not present

## 2024-07-05 DIAGNOSIS — Z923 Personal history of irradiation: Secondary | ICD-10-CM | POA: Insufficient documentation

## 2024-07-05 DIAGNOSIS — E041 Nontoxic single thyroid nodule: Secondary | ICD-10-CM | POA: Diagnosis not present

## 2024-07-05 DIAGNOSIS — R911 Solitary pulmonary nodule: Secondary | ICD-10-CM | POA: Insufficient documentation

## 2024-07-05 DIAGNOSIS — I700 Atherosclerosis of aorta: Secondary | ICD-10-CM | POA: Diagnosis not present

## 2024-07-05 DIAGNOSIS — Z79899 Other long term (current) drug therapy: Secondary | ICD-10-CM | POA: Insufficient documentation

## 2024-07-05 DIAGNOSIS — R220 Localized swelling, mass and lump, head: Secondary | ICD-10-CM | POA: Insufficient documentation

## 2024-07-05 DIAGNOSIS — I517 Cardiomegaly: Secondary | ICD-10-CM | POA: Diagnosis not present

## 2024-07-05 DIAGNOSIS — C320 Malignant neoplasm of glottis: Secondary | ICD-10-CM | POA: Diagnosis present

## 2024-07-05 DIAGNOSIS — I2510 Atherosclerotic heart disease of native coronary artery without angina pectoris: Secondary | ICD-10-CM | POA: Insufficient documentation

## 2024-07-05 DIAGNOSIS — I10 Essential (primary) hypertension: Secondary | ICD-10-CM | POA: Diagnosis not present

## 2024-07-05 DIAGNOSIS — C329 Malignant neoplasm of larynx, unspecified: Secondary | ICD-10-CM

## 2024-07-05 MED ORDER — CARVEDILOL 6.25 MG PO TABS: 6.2500 mg | ORAL_TABLET | Freq: Two times a day (BID) | ORAL | 3 refills | Status: DC

## 2024-07-08 ENCOUNTER — Other Ambulatory Visit (HOSPITAL_COMMUNITY): Payer: Self-pay

## 2024-07-08 ENCOUNTER — Encounter: Payer: Self-pay | Admitting: Cardiology

## 2024-07-08 ENCOUNTER — Ambulatory Visit: Attending: Cardiology | Admitting: Cardiology

## 2024-07-08 VITALS — BP 167/93 | HR 47 | Resp 16 | Ht 76.0 in | Wt 194.7 lb

## 2024-07-08 DIAGNOSIS — Z9889 Other specified postprocedural states: Secondary | ICD-10-CM | POA: Diagnosis not present

## 2024-07-08 DIAGNOSIS — Z8679 Personal history of other diseases of the circulatory system: Secondary | ICD-10-CM | POA: Diagnosis not present

## 2024-07-08 DIAGNOSIS — I10 Essential (primary) hypertension: Secondary | ICD-10-CM | POA: Diagnosis not present

## 2024-07-08 MED ORDER — CARVEDILOL 12.5 MG PO TABS
12.5000 mg | ORAL_TABLET | Freq: Two times a day (BID) | ORAL | 2 refills | Status: AC
  Filled 2024-07-08: qty 60, 30d supply, fill #0
  Filled 2024-09-01: qty 60, 30d supply, fill #1
  Filled 2024-10-26: qty 60, 30d supply, fill #2

## 2024-07-08 MED ORDER — AMLODIPINE BESYLATE 5 MG PO TABS: 5.0000 mg | ORAL_TABLET | Freq: Every day | ORAL | 2 refills | Status: AC

## 2024-07-08 NOTE — Patient Instructions (Signed)
 Medication Instructions:  Please increase Carvedilol  12.5 mg one tablet twice a day. Start Amlodipine  5 mg a day. Continue all other medications as listed.  *If you need a refill on your cardiac medications before your next appointment, please call your pharmacy*  Testing/Procedures: Your physician has requested that you have an echocardiogram. Echocardiography is a painless test that uses sound waves to create images of your heart. It provides your doctor with information about the size and shape of your heart and how well your heart's chambers and valves are working. This procedure takes approximately one hour. There are no restrictions for this procedure. Please do NOT wear cologne, perfume, aftershave, or lotions (deodorant is allowed). Please arrive 15 minutes prior to your appointment time.  Please note: We ask at that you not bring children with you during ultrasound (echo/ vascular) testing. Due to room size and safety concerns, children are not allowed in the ultrasound rooms during exams. Our front office staff cannot provide observation of children in our lobby area while testing is being conducted. An adult accompanying a patient to their appointment will only be allowed in the ultrasound room at the discretion of the ultrasound technician under special circumstances. We apologize for any inconvenience.  Follow-Up: At [facility], you and your health needs are our priority.  As part of our continuing mission to provide you with exceptional heart care, our providers are all part of one team.  This team includes your primary Cardiologist (physician) and Advanced Practice Providers or APPs (Physician Assistants and Nurse Practitioners) who all work together to provide you with the care you need, when you need it.  Your next appointment:   2-3 week(s)  Provider:   Wilbert Bihari, MD or ANY APP       We recommend signing up for the patient portal called MyChart.  Sign up  information is provided on this After Visit Summary.  MyChart is used to connect with patients for Virtual Visits (Telemedicine).  Patients are able to view lab/test results, encounter notes, upcoming appointments, etc.  Non-urgent messages can be sent to your provider as well.   To learn more about what you can do with MyChart, go to [email or web address].

## 2024-07-08 NOTE — Progress Notes (Signed)
 Cardiology Office Note:  .   Date:  07/09/2024  ID:  Garrett Fleming, DOB Apr 21, 1956, MRN [id number] PCP: Garrett Garrett SAUNDERS, MD  [facility] Providers Cardiologist:  Garrett Shlomo, MD   History of Present Illness: .   Garrett Fleming is a 68 y.o. 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 Serene in 2020, history of laryngeal cancer SP total laryngectomy and radical neck dissection in November 2024, follows Dr. Wilbert Garrett, seeing patient for hypertension management as a DOD.  Patient has no significant coronary disease by coronary CTA in 2021.  He remains asymptomatic.  He verbalizes minimally and is accompanied by his lifelong girlfriend.  Only recently the blood pressure has been uncontrolled.  Patient's significant other states that he does not allow her to do any activities and expects her to stay in front of him at all times.  He also shows significant amount of agitation.  Cardiac Studies relevent.    ECHOCARDIOGRAM COMPLETE 11/17/2018  1. The left ventricle has normal systolic function, with an ejection fraction of 55-60%. The cavity size was normal. Left ventricular diastolic parameters were normal. 2. The right ventricle has normal systolic function. The cavity was normal. There is no increase in right ventricular wall thickness. 3.  Mild aortic root dilatation at 39 mm.    Discussed the use of AI scribe software for clinical note transcription with the patient, who gave verbal consent to proceed.  History of Present Illness Garrett Fleming is a 68 year old male with hypertension who presents with elevated blood pressure. He is accompanied by his caregiver and spokesperson, Garrett Fleming.  He has experienced elevated blood pressure over the past week. His carvedilol  dosage was increased to 6.25 mg  twice daily two days ago. He is not currently taking amlodipine . There is a known intolerance to lisinopril , which causes shortness of breath.  Stress appears to be a contributing factor to his elevated blood pressure. He becomes upset when his caregiver leaves, leading to episodes that may worsen his condition.  He has a history of aortic valve disorder and thoracic aortic aneurysm repair, which are pertinent to his cardiovascular management.  Labs   Lab Results  Component Value Date   CHOL 168 03/20/2020   HDL 80 03/20/2020   LDLCALC 58 03/20/2020   TRIG 189 (H) 03/20/2020   CHOLHDL 2.1 03/20/2020   No results found for: LIPOA  Recent Labs    09/04/23 1401  NA 141  K 3.8  CL 105  CO2 29  GLUCOSE 77  BUN 14  CREATININE 0.82  CALCIUM  9.4  GFRNONAA >60    Lab Results  Component Value Date   ALT 11 09/04/2023   AST 13 (L) 09/04/2023   ALKPHOS 94 09/04/2023   BILITOT 0.4 09/04/2023      Latest Ref Rng & Units 09/04/2023    2:01 PM 07/06/2023    3:55 AM 07/05/2023    5:48 AM  CBC  WBC 4.0 - 10.5 K/uL 7.0  5.4  6.8   Hemoglobin 13.0 - 17.0 g/dL 88.3  7.4  7.3   Hematocrit 39.0 - 52.0 % 36.4  22.6  22.5   Platelets 150 - 400 K/uL 392  290  267    Lab Results  Component Value Date   HGBA1C 4.9 11/18/2018    Lab Results  Component Value  Date   TSH 1.720 03/01/2024    ROS  Review of Systems  Cardiovascular:  Negative for chest pain, dyspnea on exertion and leg swelling.   Physical Exam:   VS:  BP (!) 167/93 (BP Location: Left Arm, Patient Position: Sitting, Cuff Size: Normal)   Pulse (!) 47   Resp 16   Ht 6' 4 (1.93 m)   Wt 194 lb 11.2 oz (88.3 kg)   SpO2 93%   BMI 23.70 kg/m    Wt Readings from Last 3 Encounters:  07/08/24 194 lb 11.2 oz (88.3 kg)  07/05/24 189 lb (85.7 kg)  05/17/24 170 lb (77.1 kg)    BP Readings from Last 3 Encounters:  07/08/24 (!) 167/93  07/05/24 (!) 200/105  05/17/24 (!) 162/99   Physical Exam Neck:     Vascular: No  carotid bruit or JVD.  Cardiovascular:     Rate and Rhythm: Normal rate and regular rhythm.     Heart sounds: Normal heart sounds. No murmur heard.    No gallop.  Pulmonary:     Effort: Pulmonary effort is normal.     Breath sounds: Normal breath sounds.  Abdominal:     General: Bowel sounds are normal.     Palpations: Abdomen is soft.  Musculoskeletal:     Right lower leg: No edema.     Left lower leg: No edema.    EKG:    EKG Interpretation Date/Time:  Friday July 08 2024 15:34:25 EDT Ventricular Rate:  47 PR Interval:  184 QRS Duration:  104 QT Interval:  472 QTC Calculation: 417 R Axis:   35  Text Interpretation: EKG 07/08/2024: Marked sinus bradycardia at rate of 47 bpm, early repolarization, LVH.  Compared to 06/22/2023, no significant change. Confirmed by Garrett Fleming, Garrett Fleming ([id number]) on 07/09/2024 2:55:30 PM    ASSESSMENT AND PLAN: .      ICD-10-CM   1. Essential hypertension  I10 EKG 12-Lead    amLODipine  (NORVASC ) 5 MG tablet    carvedilol  (COREG ) 12.5 MG tablet    ECHOCARDIOGRAM COMPLETE    2. H/O thoracic aortic aneurysm repair  Z98.890 ECHOCARDIOGRAM COMPLETE   Z86.79      Assessment & Plan Hypertension Hypertension has been elevated throughout the week despite an increase in carvedilol  dosage. Stress and temper tantrums may be contributing to elevated blood pressure. Current regimen includes carvedilol , but adherence to medication schedule is crucial. Amlodipine  has been added to the regimen to improve blood pressure control. - Increase carvedilol  from 6.25 mg to to 12.5 mg BID - Start amlodipine  5 mg once daily - Ensure adherence to medication schedule - Follow up in 2-3 weeks for medication adjustment - Consider clonidine patches, if bradycardia becomes an issue, consider discontinuing carvedilol  and switching him to either pindolol or acebutolol with intrinsic sympathomimetic activity beta-blockers. - BiDil would also be a choice.  Aortic valve  disorder Aortic valve disorder requires monitoring. No recent echocardiogram has been performed to assess current status. - Order echocardiogram to evaluate aortic valve disorder  Thoracic aortic aneurysm repair Requires ongoing monitoring. No recent echocardiogram has been performed to assess current status. - Order echocardiogram to evaluate status post thoracic aortic aneurysm repair   Follow up: 4 weeks with APP for management of hypertension and follow-up of echocardiogram.  Signed,  Gordy Bergamo, MD, [facility] 07/09/2024, 2:55 PM [facility] [address] [place], [place] [address] Phone: [phone]. Fax:  [phone]

## 2024-08-05 ENCOUNTER — Encounter: Payer: Self-pay | Admitting: Emergency Medicine

## 2024-08-05 ENCOUNTER — Other Ambulatory Visit (HOSPITAL_COMMUNITY): Payer: Self-pay

## 2024-08-05 ENCOUNTER — Ambulatory Visit: Attending: Emergency Medicine | Admitting: Emergency Medicine

## 2024-08-05 VITALS — BP 152/90 | HR 51 | Resp 17 | Ht 76.0 in | Wt 192.0 lb

## 2024-08-05 DIAGNOSIS — I10 Essential (primary) hypertension: Secondary | ICD-10-CM | POA: Diagnosis not present

## 2024-08-05 DIAGNOSIS — Z9889 Other specified postprocedural states: Secondary | ICD-10-CM

## 2024-08-05 DIAGNOSIS — E785 Hyperlipidemia, unspecified: Secondary | ICD-10-CM | POA: Diagnosis not present

## 2024-08-05 DIAGNOSIS — I2583 Coronary atherosclerosis due to lipid rich plaque: Secondary | ICD-10-CM

## 2024-08-05 DIAGNOSIS — I2510 Atherosclerotic heart disease of native coronary artery without angina pectoris: Secondary | ICD-10-CM

## 2024-08-05 DIAGNOSIS — Z8679 Personal history of other diseases of the circulatory system: Secondary | ICD-10-CM

## 2024-08-05 MED ORDER — OMRON 3 SERIES BP MONITOR DEVI
1.0000 | 0 refills | Status: AC
  Filled 2024-08-05: qty 1, 30d supply, fill #0

## 2024-08-05 MED ORDER — ATORVASTATIN CALCIUM 20 MG PO TABS
20.0000 mg | ORAL_TABLET | Freq: Every day | ORAL | 3 refills | Status: AC
  Filled 2024-08-05: qty 90, 90d supply, fill #0

## 2024-08-05 MED ORDER — CHLORTHALIDONE 25 MG PO TABS
25.0000 mg | ORAL_TABLET | Freq: Every day | ORAL | 3 refills | Status: AC
  Filled 2024-08-05: qty 90, 90d supply, fill #0
  Filled 2024-10-26: qty 90, 90d supply, fill #1

## 2024-08-05 NOTE — Patient Instructions (Addendum)
 Medication Instructions:   START TAKING: CHLORTHALIDONE   25 MG ONCE  A DAY      START TAKING:  ATORVASTATIN   20 MG ONCE A  DAY    YOU HAVE BEEN  PRESCRIBED  AN BLOOD  PRESSOR OMRON  CUFF   *If you need a refill on your cardiac medications before your next appointment, please call your pharmacy*   Lab Work:   PLEASE GO DOWN STAIRS  [facility]  FIRST FLOOR   ( GET OFF ELEVATORS WALK TOWARDS WAITING AREA LAB LOCATED BY PHARMACY):  BMET AND CBC TODAY    RETURN IN 2 WEEKS  FOR CMET AND LIPID      If you have labs (blood work) drawn today and your tests are completely normal, you will receive your results only by: MyChart Message (if you have MyChart) OR A paper copy in the mail If you have any lab test that is abnormal or we need to change your treatment, we will call you to review the results.  Testing/Procedures: NONE ORDERED  TODAY    Follow-Up: At [facility], you and your health needs are our priority.  As part of our continuing mission to provide you with exceptional heart care, our providers are all part of one team.  This team includes your primary Cardiologist (physician) and Advanced Practice Providers or APPs (Physician Assistants and Nurse Practitioners) who all work together to provide you with the care you need, when you need it.  Your next appointment:   1 month(s)   Provider:  Lum Louis NP      We recommend signing up for the patient portal called MyChart.  Sign up information is provided on this After Visit Summary.  MyChart is used to connect with patients for Virtual Visits (Telemedicine).  Patients are able to view lab/test results, encounter notes, upcoming appointments, etc.  Non-urgent messages can be sent to your provider as well.   To learn more about what you can do with MyChart, go to [email or web address].   Other Instructions

## 2024-08-05 NOTE — Progress Notes (Signed)
 Cardiology Office Note:    Date:  08/05/2024  ID:  Garrett Fleming, DOB 04/05/1956, MRN [id number] PCP: Garrett Garrett SAUNDERS, MD   [facility] Providers Cardiologist:  Garrett Shlomo, MD Cardiology APP:  Garrett Garrett LITTIE, NP       Patient Profile:       Chief Complaint: Follow-up for hypertension History of Present Illness:  Garrett Fleming is a 68 y.o. male with visit-pertinent history of abuse, hypertension, cocaine abuse, coronary artery disease, history of aortic intramural hematoma penetrating aortic ulcer, SSCA of the [place]  He was initially referred for blood pressure management by vascular surgery.  He was in the hospital  with chest pain and was diagnosed with an intramural aortic hematoma and penetrating ulcer in the setting of hypertension.  He  underwent left subclavian to left carotid transposition with endovascular repair of the thoracic aorta with coverage of the subclavian artery by Dr. Serene.     Echocardiogram 10/2018 showed LVEF 55 to 60% with moderate LAE, mild AVS CAD and mildly dilated aortic root at 39 mm.   He was seen for follow-up on 02/2020 and was off ACE inhibitor due to what sounded like angioedema.  He underwent coronary CTA on 03/2019 which demonstrated coronary calcium  score 187 with less than 25% proximal RCA stenosis, 25 to 49% distal RCA stenosis, less than 25% proximal to mid LAD and proximal left circumflex.  He was then lost to follow-up.  He reestablished with cardiology service on 11/10/2023 with Dr. Shlomo.  In the interim he was dx with [facility] of th larynx s/p total laryngectomy and radiacl neck in Nov 2024 and now followed by Oncology and rad onc. Now just getting XRT.  During office visit he was stable without chest pains or shortness of breath.  He was started on aspirin  81 mg daily.  He was restarted on carvedilol  3.125 mg twice daily.  He was to follow-up in 1 year.  He was last seen in clinic on 07/08/2024 by Dr. Ladona.  He was seen by DOD  for management of hypertension.  His blood pressure was elevated at 167/93.  His carvedilol  was increased to 12.5 mg twice daily and he was started on amlodipine  5 mg daily.   Discussed the use of AI scribe software for clinical note transcription with the patient, who gave verbal consent to proceed.  History of Present Illness Garrett Fleming is a 68 year old male with hypertension who presents with concerns about medication management.  He communicates by writing on a notepad.  He is experiencing issues with blood pressure management and is currently taking carvedilol , which was recently increased to 12.5 mg. There is confusion regarding the dosage due to two different pill bottles.  He is actually currently taking carvedilol  12.5 mg twice daily and then another bottle of carvedilol  3.125 mg twice daily.  He also takes amlodipine  and aspirin  81 mg. He does not have a blood pressure cuff at home for regular monitoring.  He has a history of lip swelling with lisinopril , which he no longer takes. He recalls being on chlorthalidone  in 2021.  He is establishing with a primary care doctor soon.  He does not have a regular eating schedule and tends to eat frequently throughout the day.  He has not had recent cholesterol levels checked and does not currently take medication for cholesterol.   He denies chest pains, dyspnea orthopnea, PND, LEE, palpitations, syncope, presyncope, lightheadedness dizziness    Review of systems:  Please see the history of present illness. All other systems are reviewed and otherwise negative.      Studies Reviewed:        Coronary CTA 04/13/2020 1. Coronary calcium  score of 187. This was 88th percentile for age and sex matched controls.   2. Normal coronary origin with right dominance.   3. Minimal calcified plaque (<25%) in the LAD and LCX.   4. Mild non-calcified plaque in the RCA (25-49%).   RECOMMENDATIONS: 1. Mild non-obstructive CAD (25-49%).  Consider non-atherosclerotic causes of chest pain. Consider preventive therapy and risk factor modification.  Echocardiogram 11/17/2018  1. The left ventricle has normal systolic function, with an ejection  fraction of 55-60%. The cavity size was normal. Left ventricular diastolic  parameters were normal.   2. The right ventricle has normal systolic function. The cavity was  normal. There is no increase in right ventricular wall thickness.   3. Left atrial size was moderately dilated.   4. The mitral valve is normal in structure. Mild thickening of the mitral  valve leaflet.   5. The tricuspid valve is normal in structure.   6. The aortic valve is tricuspid Mild thickening of the aortic valve  Sclerosis without any evidence of stenosis of the aortic valve.   7. The pulmonic valve was normal in structure.   8. There is mild dilatation of the aortic root measuring 39 mm.   Risk Assessment/Calculations:     HYPERTENSION CONTROL Vitals:   08/05/24 1455 08/05/24 1531  BP: (!) 153/89 (!) 152/90    The patient's blood pressure is elevated above target today.  In order to address the patient's elevated BP: A new medication was prescribed today.           Physical Exam:   VS:  BP (!) 152/90   Pulse (!) 51   Resp 17   Ht 6' 4 (1.93 m)   Wt 192 lb (87.1 kg)   SpO2 95%   BMI 23.37 kg/m    Wt Readings from Last 3 Encounters:  08/05/24 192 lb (87.1 kg)  07/08/24 194 lb 11.2 oz (88.3 kg)  07/05/24 189 lb (85.7 kg)    GEN: Well nourished, well developed in no acute distress NECK: No JVD; No carotid bruits CARDIAC: RRR, no murmurs, rubs, gallops RESPIRATORY:  Clear to auscultation without rales, wheezing or rhonchi  ABDOMEN: Soft, non-tender, non-distended EXTREMITIES:  No edema; No acute deformity     Assessment and Plan:  Coronary artery disease Coronary CTA 03/2020 showed coronary calcium  score of 187 (88th percentile) with less than 25% proximal RCA stenosis, 25 to 49%  distal RCA stenosis, less than 25% proximal to mid LAD and proximal left circumflex  - Today he is stable without chest pains.  Remains fairly active without exertional symptoms.  No indication of further ischemic evaluation at this time - Will start atorvastatin  20 mg daily today (not on lipid-lowering therapy) - Continue aspirin  81 mg a day  Hypertension Blood pressure today is elevated at 153/89 He has been mistakenly taking carvedilol  12.5 mg twice daily as well as 3.125 mg twice daily since last office visit due to medication confusion.  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  Signed, Garrett Fleming Louis, NP

## 2024-08-06 LAB — CBC
Hematocrit: 39.9 % (ref 37.5–51.0)
Hemoglobin: 12.8 g/dL — ABNORMAL LOW (ref 13.0–17.7)
MCH: 31.4 pg (ref 26.6–33.0)
MCHC: 32.1 g/dL (ref 31.5–35.7)
MCV: 98 fL — ABNORMAL HIGH (ref 79–97)
Platelets: 248 x10E3/uL (ref 150–450)
RBC: 4.08 x10E6/uL — ABNORMAL LOW (ref 4.14–5.80)
RDW: 12 % (ref 11.6–15.4)
WBC: 3.8 x10E3/uL (ref 3.4–10.8)

## 2024-08-06 LAB — BASIC METABOLIC PANEL WITH GFR
BUN/Creatinine Ratio: 15 (ref 10–24)
BUN: 15 mg/dL (ref 8–27)
CO2: 23 mmol/L (ref 20–29)
Calcium: 8.7 mg/dL (ref 8.6–10.2)
Chloride: 104 mmol/L (ref 96–106)
Creatinine, Ser: 1.01 mg/dL (ref 0.76–1.27)
Glucose: 87 mg/dL (ref 70–99)
Potassium: 4 mmol/L (ref 3.5–5.2)
Sodium: 139 mmol/L (ref 134–144)
eGFR: 82 mL/min/1.73 (ref >59)

## 2024-08-08 ENCOUNTER — Ambulatory Visit: Payer: Self-pay | Admitting: Emergency Medicine

## 2024-08-23 ENCOUNTER — Ambulatory Visit: Payer: Self-pay | Admitting: Cardiology

## 2024-08-23 ENCOUNTER — Ambulatory Visit (HOSPITAL_COMMUNITY)
Admission: RE | Admit: 2024-08-23 | Discharge: 2024-08-23 | Disposition: A | Discharge status: Home / Self Care | Source: Ambulatory Visit | Attending: Internal Medicine | Admitting: Internal Medicine

## 2024-08-23 DIAGNOSIS — I10 Essential (primary) hypertension: Secondary | ICD-10-CM | POA: Diagnosis not present

## 2024-08-23 DIAGNOSIS — I2510 Atherosclerotic heart disease of native coronary artery without angina pectoris: Secondary | ICD-10-CM | POA: Diagnosis not present

## 2024-08-23 DIAGNOSIS — Z9889 Other specified postprocedural states: Secondary | ICD-10-CM | POA: Insufficient documentation

## 2024-08-23 DIAGNOSIS — Z8679 Personal history of other diseases of the circulatory system: Secondary | ICD-10-CM | POA: Insufficient documentation

## 2024-08-23 LAB — ECHOCARDIOGRAM COMPLETE
Area-P 1/2: 1.94 cm2
S' Lateral: 2.8 cm

## 2024-09-01 ENCOUNTER — Other Ambulatory Visit (HOSPITAL_COMMUNITY): Payer: Self-pay

## 2024-09-06 ENCOUNTER — Other Ambulatory Visit (HOSPITAL_COMMUNITY): Payer: Self-pay

## 2024-09-12 ENCOUNTER — Ambulatory Visit: Attending: Emergency Medicine | Admitting: Emergency Medicine

## 2024-09-12 NOTE — Progress Notes (Deleted)
 Cardiology Office Note:    Date:  09/12/2024  ID:  Garrett Fleming, DOB 1956/08/10, MRN [id number] PCP: Shlomo Wilbert SAUNDERS, MD  [facility] Providers Cardiologist:  Wilbert Shlomo, MD Cardiology APP:  Rana Lum LITTIE, NP { Click to update primary MD,subspecialty MD or APP then REFRESH:1}    {Click to Open Review  :1}   Patient Profile:       Chief Complaint: *** History of Present Illness:  Garrett Fleming is a 67 y.o. male with visit-pertinent history of tobacco abuse, hypertension, cocaine abuse, coronary artery disease, history of aortic intramural hematoma penetrating aortic ulcer, SSCA of the [place]   He was initially referred for blood pressure management by vascular surgery.  He was in the hospital  with chest pain and was diagnosed with an intramural aortic hematoma and penetrating ulcer in the setting of hypertension.  He underwent left subclavian to left carotid transposition with endovascular repair of the thoracic aorta with coverage of the subclavian artery by Dr. Serene.      Echocardiogram 10/2018 showed LVEF 55 to 60% with moderate LAE, mild AVS CAD and mildly dilated aortic root at 39 mm.    He was seen for follow-up on 02/2020 and was off ACE inhibitor due to what sounded like angioedema.  He underwent coronary CTA on 03/2019 which demonstrated coronary calcium  score 187 with less than 25% proximal RCA stenosis, 25 to 49% distal RCA stenosis, less than 25% proximal to mid LAD and proximal left circumflex.  He was then lost to follow-up.   He reestablished with cardiology service on 11/10/2023 with Dr. Shlomo.  In the interim he was dx with [facility] of th larynx s/p total laryngectomy and radiacl neck in Nov 2024 and now followed by Oncology and rad onc. Now just getting XRT.  During office visit he was stable without chest pains or shortness of breath.  He was started on aspirin  81 mg daily.  He was restarted on carvedilol  3.125 mg twice daily.  He was to follow-up in 1  year.   Seen in clinic on 07/08/2024 by Dr. Ladona.  He was seen by DOD for management of hypertension.  His blood pressure was elevated at 167/93.  His carvedilol  was increased to 12.5 mg twice daily and he was started on amlodipine  5 mg daily.  Last seen in clinic on 08/05/2024.  He was mistakenly taking carvedilol  12.5 mg twice daily and 3.125 mg twice daily due to medication confusion.  He was continued on carvedilol  12.5 mg twice daily as well as amlodipine  5 mg daily.  He was started on chlorthalidone  25 mg daily.  Discussed the use of AI scribe software for clinical note transcription with the patient, who gave verbal consent to proceed.  History of Present Illness     Review of systems:  Please see the history of present illness. All other systems are reviewed and otherwise negative. ***      Studies Reviewed:        ***  Risk Assessment/Calculations:   {Does this patient have ATRIAL FIBRILLATION?[phone]} No BP recorded.  {Refresh Note OR Click here to enter BP  :1}***        Physical Exam:   VS:  There were no vitals taken for this visit.   Wt Readings from Last 3 Encounters:  08/05/24 192 lb (87.1 kg)  07/08/24 194 lb 11.2 oz (88.3 kg)  07/05/24 189 lb (85.7 kg)    GEN: Well nourished, well developed in  no acute distress NECK: No JVD; No carotid bruits CARDIAC: ***RRR, no murmurs, rubs, gallops RESPIRATORY:  Clear to auscultation without rales, wheezing or rhonchi  ABDOMEN: Soft, non-tender, non-distended EXTREMITIES:  No edema; No acute deformity ***      Assessment and Plan:    Assessment and Plan Assessment & Plan      {Are you ordering a CV Procedure (e.g. stress test, cath, DCCV, TEE, etc)?   Press F2        :[phone]}  Dispo:  No follow-ups on file.  Signed, Lum LITTIE Louis, NP

## 2024-10-26 ENCOUNTER — Other Ambulatory Visit (HOSPITAL_COMMUNITY): Payer: Self-pay

## 2024-11-01 ENCOUNTER — Other Ambulatory Visit (HOSPITAL_COMMUNITY): Payer: Self-pay

## 2025-03-14 ENCOUNTER — Ambulatory Visit: Payer: Self-pay | Admitting: Radiology
